# Patient Record
Sex: Male | Born: 1950 | Race: White | Hispanic: No | Marital: Married | Smoking: Current every day smoker
Health system: Southern US, Community
[De-identification: ages and names within clinical notes are randomized; demographics above are authoritative.]

## PROBLEM LIST (undated history)

## (undated) DIAGNOSIS — F329 Major depressive disorder, single episode, unspecified: Secondary | ICD-10-CM

## (undated) DIAGNOSIS — F419 Anxiety disorder, unspecified: Secondary | ICD-10-CM

## (undated) DIAGNOSIS — M1990 Unspecified osteoarthritis, unspecified site: Secondary | ICD-10-CM

## (undated) DIAGNOSIS — F32A Depression, unspecified: Secondary | ICD-10-CM

## (undated) DIAGNOSIS — I10 Essential (primary) hypertension: Secondary | ICD-10-CM

## (undated) DIAGNOSIS — T7840XA Allergy, unspecified, initial encounter: Secondary | ICD-10-CM

## (undated) HISTORY — DX: Unspecified osteoarthritis, unspecified site: M19.90

## (undated) HISTORY — DX: Anxiety disorder, unspecified: F41.9

## (undated) HISTORY — PX: JOINT REPLACEMENT: SHX530

## (undated) HISTORY — DX: Major depressive disorder, single episode, unspecified: F32.9

## (undated) HISTORY — DX: Depression, unspecified: F32.A

## (undated) HISTORY — DX: Essential (primary) hypertension: I10

## (undated) HISTORY — DX: Allergy, unspecified, initial encounter: T78.40XA

---

## 1997-07-07 ENCOUNTER — Emergency Department (HOSPITAL_COMMUNITY): Admission: EM | Admit: 1997-07-07 | Discharge: 1997-07-07 | Payer: Self-pay | Admitting: Emergency Medicine

## 2009-12-08 ENCOUNTER — Emergency Department (HOSPITAL_COMMUNITY)
Admission: EM | Admit: 2009-12-08 | Discharge: 2009-12-08 | Payer: Self-pay | Source: Home / Self Care | Admitting: Emergency Medicine

## 2010-12-23 ENCOUNTER — Ambulatory Visit (INDEPENDENT_AMBULATORY_CARE_PROVIDER_SITE_OTHER): Payer: PRIVATE HEALTH INSURANCE

## 2010-12-23 DIAGNOSIS — F339 Major depressive disorder, recurrent, unspecified: Secondary | ICD-10-CM

## 2010-12-23 DIAGNOSIS — Z0000 Encounter for general adult medical examination without abnormal findings: Secondary | ICD-10-CM

## 2010-12-23 DIAGNOSIS — I10 Essential (primary) hypertension: Secondary | ICD-10-CM

## 2010-12-23 DIAGNOSIS — F17200 Nicotine dependence, unspecified, uncomplicated: Secondary | ICD-10-CM

## 2010-12-24 ENCOUNTER — Other Ambulatory Visit: Payer: Self-pay | Admitting: Family Medicine

## 2010-12-24 DIAGNOSIS — M5412 Radiculopathy, cervical region: Secondary | ICD-10-CM

## 2011-01-05 ENCOUNTER — Ambulatory Visit
Admission: RE | Admit: 2011-01-05 | Discharge: 2011-01-05 | Disposition: A | Discharge status: Home / Self Care | Payer: Self-pay | Source: Ambulatory Visit | Attending: Family Medicine | Admitting: Family Medicine

## 2011-01-05 DIAGNOSIS — M5412 Radiculopathy, cervical region: Secondary | ICD-10-CM

## 2011-05-03 ENCOUNTER — Other Ambulatory Visit: Payer: Self-pay | Admitting: Family Medicine

## 2011-05-30 ENCOUNTER — Ambulatory Visit (INDEPENDENT_AMBULATORY_CARE_PROVIDER_SITE_OTHER): Payer: BC Managed Care – PPO | Admitting: Physician Assistant

## 2011-05-30 ENCOUNTER — Encounter: Payer: Self-pay | Admitting: Physician Assistant

## 2011-05-30 VITALS — BP 112/72 | HR 83 | Temp 97.9°F | Resp 20 | Ht 69.5 in | Wt 210.0 lb

## 2011-05-30 DIAGNOSIS — R319 Hematuria, unspecified: Secondary | ICD-10-CM

## 2011-05-30 DIAGNOSIS — Z87891 Personal history of nicotine dependence: Secondary | ICD-10-CM

## 2011-05-30 DIAGNOSIS — I10 Essential (primary) hypertension: Secondary | ICD-10-CM

## 2011-05-30 DIAGNOSIS — R1032 Left lower quadrant pain: Secondary | ICD-10-CM

## 2011-05-30 LAB — POCT URINALYSIS DIPSTICK
Bilirubin, UA: NEGATIVE
Glucose, UA: NEGATIVE
Ketones, UA: NEGATIVE
Nitrite, UA: NEGATIVE
Protein, UA: NEGATIVE
Spec Grav, UA: 1.025
Urobilinogen, UA: 0.2
pH, UA: 5.5

## 2011-05-30 LAB — POCT CBC
Granulocyte percent: 66.5 %G (ref 37–80)
HCT, POC: 46.3 % (ref 43.5–53.7)
Hemoglobin: 15.8 g/dL (ref 14.1–18.1)
Lymph, poc: 1.8 (ref 0.6–3.4)
MCH, POC: 30.4 pg (ref 27–31.2)
MCHC: 34.1 g/dL (ref 31.8–35.4)
MCV: 89.3 fL (ref 80–97)
MID (cbc): 0.8 (ref 0–0.9)
MPV: 9.8 fL (ref 0–99.8)
POC Granulocyte: 5.1 (ref 2–6.9)
POC LYMPH PERCENT: 23.2 %L (ref 10–50)
POC MID %: 10.3 %M (ref 0–12)
Platelet Count, POC: 201 10*3/uL (ref 142–424)
RBC: 5.19 M/uL (ref 4.69–6.13)
RDW, POC: 13.2 %
WBC: 7.7 10*3/uL (ref 4.6–10.2)

## 2011-05-30 LAB — POCT UA - MICROSCOPIC ONLY
Casts, Ur, LPF, POC: NEGATIVE
Crystals, Ur, HPF, POC: NEGATIVE
Epithelial cells, urine per micros: NEGATIVE
Mucus, UA: NEGATIVE
Yeast, UA: NEGATIVE

## 2011-05-30 LAB — IFOBT (OCCULT BLOOD): IFOBT: NEGATIVE

## 2011-05-30 MED ORDER — LISINOPRIL 10 MG PO TABS: 10.0000 mg | ORAL_TABLET | Freq: Every day | ORAL | Status: DC

## 2011-05-30 MED ORDER — CIPROFLOXACIN HCL 500 MG PO TABS: 500.0000 mg | ORAL_TABLET | Freq: Two times a day (BID) | ORAL | Status: AC

## 2011-05-30 NOTE — Progress Notes (Signed)
Precepted with Ms. Marte, PA-C and agree.  

## 2011-05-30 NOTE — Progress Notes (Signed)
Subjective:    Patient ID: Keith Cobb, male    DOB: June 21, 1950, 61 y.o.   MRN: [id number]  HPI Patient presents with LLQ pain since 05/28/11. Describes as a dull, achy pain. History of diverticulitis in 2009 that he believes was treated with antibiotics but he does not know for certain. Last normal BM was this morning. No dysuria, urinary frequency, nausea, vomiting, diarrhea, constipation, melena, or hematochezia.  Patient also admits that he has been taking Cymbalta 60 mg daily which he believes is working well. He does not wish to change the dose today although he does admit to increased stress at work. Believes that his abdominal pain could also be related to stress due since the episodes tend to coincide with exacerbations of anxiety and stress.     Review of Systems  Constitutional: Negative for fever and chills.  Gastrointestinal: Positive for abdominal pain. Negative for nausea, vomiting, diarrhea and blood in stool.  Genitourinary: Negative for dysuria, frequency, flank pain, scrotal swelling and difficulty urinating.  Skin: Negative for rash.  Neurological: Negative for dizziness.  Psychiatric/Behavioral: The patient is nervous/anxious.        Objective:   Physical Exam  Constitutional: He is oriented to person, place, and time. He appears well-developed and well-nourished.  HENT:  Head: Normocephalic and atraumatic.  Right Ear: External ear normal.  Left Ear: External ear normal.  Eyes: Conjunctivae are normal.  Neck: Neck supple.  Cardiovascular: Normal rate, regular rhythm and normal heart sounds.   Pulmonary/Chest: Effort normal and breath sounds normal.  Abdominal: Normal appearance and bowel sounds are normal. There is no splenomegaly or hepatomegaly. There is tenderness in the suprapubic area and left lower quadrant. There is guarding. There is no rebound, no CVA tenderness and no tenderness at McBurney's point. Hernia confirmed negative in the right inguinal area and  confirmed negative in the left inguinal area.  Genitourinary: Testes normal and penis normal. Rectal exam shows anal tone normal. Prostate is not enlarged and not tender. Right testis shows no tenderness. Left testis shows no tenderness.  Lymphadenopathy:       Right: No inguinal adenopathy present.       Left: No inguinal adenopathy present.  Neurological: He is alert and oriented to person, place, and time.  Psychiatric: He has a normal mood and affect. His behavior is normal. Judgment and thought content normal.    Results for orders placed in visit on 05/30/11  POCT CBC      Component Value Range   WBC 7.7  4.6 - 10.2 (K/uL)   Lymph, poc 1.8  0.6 - 3.4    POC LYMPH PERCENT 23.2  10 - 50 (%L)   MID (cbc) 0.8  0 - 0.9    POC MID % 10.3  0 - 12 (%M)   POC Granulocyte 5.1  2 - 6.9    Granulocyte percent 66.5  37 - 80 (%G)   RBC 5.19  4.69 - 6.13 (M/uL)   Hemoglobin 15.8  14.1 - 18.1 (g/dL)   HCT, POC 53.6  64.4 - 53.7 (%)   MCV 89.3  80 - 97 (fL)   MCH, POC 30.4  27 - 31.2 (pg)   MCHC 34.1  31.8 - 35.4 (g/dL)   RDW, POC 03.4     Platelet Count, POC 201  142 - 424 (K/uL)   MPV 9.8  0 - 99.8 (fL)  POCT URINALYSIS DIPSTICK      Component Value Range   Color, UA  Psychologist, educational, UA clear     Glucose, UA neg     Bilirubin, UA neg     Ketones, UA neg     Spec Grav, UA 1.025     Blood, UA moderate     pH, UA 5.5     Protein, UA neg     Urobilinogen, UA 0.2     Nitrite, UA neg     Leukocytes, UA small (1+)    POCT UA - MICROSCOPIC ONLY      Component Value Range   WBC, Ur, HPF, POC 4-5     RBC, urine, microscopic 5-8     Bacteria, U Microscopic small     Mucus, UA neg     Epithelial cells, urine per micros neg     Crystals, Ur, HPF, POC neg     Casts, Ur, LPF, POC neg     Yeast, UA neg    IFOBT (OCCULT BLOOD)      Component Value Range   IFOBT Negative           Assessment & Plan:            1. Abdominal pain, left lower quadrant  Will treat as urinary  tract infection and also cover prostatitis while awaiting urine culture.  Patient instructed to return in 48-72 hours if he does not notice significant improvement in symptoms.  POCT CBC, POCT urinalysis dipstick, POCT UA - Microscopic Only, IFOBT POC (occult bld, rslt in office)  2. Hematuria  Recommend recheck at next visit to ensure clearance.  Urine culture  3. Personal history of tobacco use  Smoking cessation counseling provided Patient stable on current dose of Cymbalta   4. Hypertension  Lisinopril 10 mg refilled x 6 months with follow up at that time and labwork at that time.

## 2011-05-30 NOTE — Patient Instructions (Addendum)
Take Cipro 500 mg twice daily x 7 days If you do not notice significant improvement in 48-72 hours, return to clinic for further evaluation

## 2011-05-31 LAB — URINE CULTURE
Colony Count: NO GROWTH
Organism ID, Bacteria: NO GROWTH

## 2011-08-02 ENCOUNTER — Other Ambulatory Visit: Payer: Self-pay | Admitting: Family Medicine

## 2011-08-03 ENCOUNTER — Telehealth: Payer: Self-pay

## 2011-08-03 NOTE — Telephone Encounter (Signed)
Pt needs to get refill on his cymbalta

## 2011-08-04 NOTE — Telephone Encounter (Signed)
Cymbalta was refilled on 7/23. If not at pharmacy can call it in.

## 2011-08-05 NOTE — Telephone Encounter (Signed)
PT GOT RX

## 2011-09-19 ENCOUNTER — Ambulatory Visit (INDEPENDENT_AMBULATORY_CARE_PROVIDER_SITE_OTHER): Payer: BC Managed Care – PPO | Admitting: Family Medicine

## 2011-09-19 ENCOUNTER — Encounter: Payer: Self-pay | Admitting: Family Medicine

## 2011-09-19 VITALS — BP 112/80 | HR 70 | Temp 98.2°F | Resp 18 | Ht 68.5 in | Wt 205.2 lb

## 2011-09-19 DIAGNOSIS — L720 Epidermal cyst: Secondary | ICD-10-CM

## 2011-09-19 DIAGNOSIS — I10 Essential (primary) hypertension: Secondary | ICD-10-CM

## 2011-09-19 DIAGNOSIS — L723 Sebaceous cyst: Secondary | ICD-10-CM

## 2011-09-19 DIAGNOSIS — F419 Anxiety disorder, unspecified: Secondary | ICD-10-CM

## 2011-09-19 DIAGNOSIS — L821 Other seborrheic keratosis: Secondary | ICD-10-CM

## 2011-09-19 DIAGNOSIS — F341 Dysthymic disorder: Secondary | ICD-10-CM

## 2011-09-19 DIAGNOSIS — K644 Residual hemorrhoidal skin tags: Secondary | ICD-10-CM

## 2011-09-19 MED ORDER — LISINOPRIL 10 MG PO TABS: 10.0000 mg | ORAL_TABLET | Freq: Every day | ORAL | Status: DC

## 2011-09-19 MED ORDER — DULOXETINE HCL 60 MG PO CPEP: 60.0000 mg | ORAL_CAPSULE | Freq: Two times a day (BID) | ORAL | Status: DC

## 2011-09-19 NOTE — Progress Notes (Signed)
Subjective: 61 year old man who has been doing well lately. He continues to take the Cymbalta. He would like something to give her more energy. He's tried testosterone unsuccessfully. He continues to work his job, and that seems to be stable. He is doing security work. He takes Cymbalta 60 mg twice daily. He has a skin lesions that he wants me to look at. He has a skin tag on each of his eyelids and they skin lesion in his left sideburn.  Objective: Alert, oriented, moderately overweight white male. He is hard of hearing. He has a seborrheic keratosis in the anterior aspect of his left sideburn which is approximately 5 mm in diameter. Each eyelid has a single skin today on it. Neck supple without thyromegaly. Chest clear. Heart regular without murmurs. Abdomen soft nontender. No ankle edema. He says that he is then removed on 2 occasions in the past has recurred on the right side of his neck.  Procedure: Cryosurgery The seborrheic keratosis on his temple was treated with the cryosurgery liquid nitrogen. Free thaw refreeze x2. He tolerated well.  Excision of skin tags Each of the skin tags was removed with alcohol prep, then clipping off with sharp scissors. 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 He is to return in one week for me to excise the cyst from the right side of his neck. I will do that tomorrow for building in the procedure room approximately 3:00  Same medications except will change him from the 30 mg Cymbalta to 60 mg she will take one pill twice a day.

## 2011-09-19 NOTE — Patient Instructions (Signed)
Return next Tuesday 17th at 3 PM to [address] office.

## 2011-09-27 ENCOUNTER — Ambulatory Visit (INDEPENDENT_AMBULATORY_CARE_PROVIDER_SITE_OTHER): Payer: BC Managed Care – PPO | Admitting: Family Medicine

## 2011-09-27 VITALS — BP 126/87 | HR 86 | Temp 98.1°F | Resp 16 | Ht 69.5 in | Wt 206.0 lb

## 2011-09-27 DIAGNOSIS — L723 Sebaceous cyst: Secondary | ICD-10-CM

## 2011-09-27 DIAGNOSIS — L720 Epidermal cyst: Secondary | ICD-10-CM

## 2011-09-27 NOTE — Progress Notes (Signed)
  Procedure note: Patient came in for me to remove the cyst from the right neck.  It has a lot of old scar with it.  1% lidocaine.  Tolerated well.  Excision of cyst plus old scar which was about 2.3 cm..  Repair with interrupted sutures.  Return 1 week.  Imp: epidermal cyst

## 2011-10-01 ENCOUNTER — Encounter: Payer: Self-pay | Admitting: Family Medicine

## 2012-03-19 ENCOUNTER — Ambulatory Visit (INDEPENDENT_AMBULATORY_CARE_PROVIDER_SITE_OTHER): Payer: BC Managed Care – PPO | Admitting: Family Medicine

## 2012-03-19 VITALS — BP 118/80 | HR 69 | Temp 98.2°F | Ht 69.0 in | Wt 206.2 lb

## 2012-03-19 DIAGNOSIS — F329 Major depressive disorder, single episode, unspecified: Secondary | ICD-10-CM

## 2012-03-19 DIAGNOSIS — F32A Depression, unspecified: Secondary | ICD-10-CM | POA: Insufficient documentation

## 2012-03-19 DIAGNOSIS — I10 Essential (primary) hypertension: Secondary | ICD-10-CM | POA: Insufficient documentation

## 2012-03-19 MED ORDER — LISINOPRIL 10 MG PO TABS: 10.0000 mg | ORAL_TABLET | Freq: Every day | ORAL | Status: DC

## 2012-03-19 MED ORDER — DULOXETINE HCL 60 MG PO CPEP: 60.0000 mg | ORAL_CAPSULE | Freq: Two times a day (BID) | ORAL | Status: DC

## 2012-03-19 NOTE — Progress Notes (Signed)
[facility] [address], [place] [address] [phone]  Date:  03/19/2012   Name:  CONSUELO Cobb   DOB:  04/11/1950   MRN:  [id number]  PCP:  No primary Earlene Bjelland on file.    Chief Complaint: Medication Refill   History of Present Illness:  Keith Cobb is a 62 y.o. very pleasant male patient who presents with the following:  He is here today for a medication refill- he would like to see his PCP Dr. Alwyn Ren soon, but did not have time today.  He knows he is due for labs but defers these today as he is short on time.  He is doing well with his BP control, and cymbalta is working well for his mood.  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  However, he continues to stay active as he is not ready to contemplate surgery or other treatments at this time  There is no problem list on file for this patient.   Past Medical History  Diagnosis Date  . Hypertension   . Arthritis     History reviewed. No pertinent past surgical history.  History  Substance Use Topics  . Smoking status: Current Every Day Smoker -- 0.30 packs/day for 39 years    Types: Cigarettes  . Smokeless tobacco: Not on file  . Alcohol Use: Not on file    History reviewed. No pertinent family history.  Allergies  Allergen Reactions  . Bee Venom     Medication list has been reviewed and updated.  Current Outpatient Prescriptions on File Prior to Visit  Medication Sig Dispense Refill  . DULoxetine (CYMBALTA) 60 MG capsule Take 1 capsule (60 mg total) by mouth 2 (two) times daily.  60 capsule  5  . lisinopril (PRINIVIL,ZESTRIL) 10 MG tablet Take 1 tablet (10 mg total) by mouth daily.  90 tablet  3  . Multiple Vitamin (MULTIVITAMIN) tablet Take 1 tablet by mouth daily.       No current facility-administered medications on file prior to visit.    Review of Systems:  As per HPI- otherwise negative.   Physical Examination: Filed Vitals:   03/19/12 1538   BP: 118/80  Pulse: 69  Temp: 98.2 F (36.8 C)   Filed Vitals:   03/19/12 1538  Height: 5\' 9"  (1.753 m)  Weight: 206 lb 3.2 oz (93.532 kg)   Body mass index is 30.44 kg/(m^2). Ideal Body Weight: Weight in (lb) to have BMI = 25: 168.9  GEN: WDWN, NAD, Non-toxic, A & O x 3 HEENT: Atraumatic, Normocephalic. Neck supple. No masses, No LAD. Ears and Nose: No external deformity. CV: RRR, No M/G/R. No JVD. No thrill. No extra heart sounds. PULM: CTA B, no wheezes, crackles, rhonchi. No retractions. No resp. distress. No accessory muscle use. EXTR: No c/c/e NEURO Normal gait.  PSYCH: Normally interactive. Conversant. Not depressed or anxious appearing.  Calm demeanor.    Assessment and Plan: HTN (hypertension) - Plan: lisinopril (PRINIVIL,ZESTRIL) 10 MG tablet  Anxiety and depression - Plan: DULoxetine (CYMBALTA) 60 MG capsule  Refilled medications today.  He will see Dr. Alwyn Ren soon- offered labs today but he declined.  He is doing well with his current medications, aware that he needs labs asap   Abbe Amsterdam, MD

## 2012-03-19 NOTE — Patient Instructions (Addendum)
Continue using your medications.  Come and see [place] for fasting labs when you can- let us know if any problems in the meantime

## 2012-05-08 ENCOUNTER — Ambulatory Visit (INDEPENDENT_AMBULATORY_CARE_PROVIDER_SITE_OTHER): Payer: BC Managed Care – PPO | Admitting: Family Medicine

## 2012-05-08 VITALS — BP 118/76 | HR 76 | Temp 98.5°F | Resp 16 | Ht 68.75 in | Wt 210.4 lb

## 2012-05-08 DIAGNOSIS — M1612 Unilateral primary osteoarthritis, left hip: Secondary | ICD-10-CM

## 2012-05-08 DIAGNOSIS — M169 Osteoarthritis of hip, unspecified: Secondary | ICD-10-CM

## 2012-05-08 DIAGNOSIS — M4802 Spinal stenosis, cervical region: Secondary | ICD-10-CM

## 2012-05-08 DIAGNOSIS — F329 Major depressive disorder, single episode, unspecified: Secondary | ICD-10-CM

## 2012-05-08 DIAGNOSIS — Z0000 Encounter for general adult medical examination without abnormal findings: Secondary | ICD-10-CM

## 2012-05-08 DIAGNOSIS — F32A Depression, unspecified: Secondary | ICD-10-CM

## 2012-05-08 DIAGNOSIS — F419 Anxiety disorder, unspecified: Secondary | ICD-10-CM

## 2012-05-08 DIAGNOSIS — I10 Essential (primary) hypertension: Secondary | ICD-10-CM

## 2012-05-08 DIAGNOSIS — M5412 Radiculopathy, cervical region: Secondary | ICD-10-CM

## 2012-05-08 LAB — POCT CBC
Granulocyte percent: 73.2 %G (ref 37–80)
HCT, POC: 43.6 % (ref 43.5–53.7)
Hemoglobin: 13.7 g/dL — AB (ref 14.1–18.1)
POC Granulocyte: 4.7 (ref 2–6.9)
POC LYMPH PERCENT: 18.4 %L (ref 10–50)
RBC: 4.9 M/uL (ref 4.69–6.13)
RDW, POC: 14.7 %

## 2012-05-08 LAB — COMPREHENSIVE METABOLIC PANEL
AST: 12 U/L (ref 0–37)
Albumin: 4.1 g/dL (ref 3.5–5.2)
Alkaline Phosphatase: 56 U/L (ref 39–117)
Chloride: 104 mEq/L (ref 96–112)
Potassium: 5.1 mEq/L (ref 3.5–5.3)
Sodium: 135 mEq/L (ref 135–145)
Total Protein: 6.6 g/dL (ref 6.0–8.3)

## 2012-05-08 LAB — POCT URINALYSIS DIPSTICK
Glucose, UA: NEGATIVE
Nitrite, UA: NEGATIVE
Urobilinogen, UA: 0.2

## 2012-05-08 LAB — POCT UA - MICROSCOPIC ONLY
Casts, Ur, LPF, POC: NEGATIVE
Crystals, Ur, HPF, POC: NEGATIVE

## 2012-05-08 LAB — POCT GLYCOSYLATED HEMOGLOBIN (HGB A1C): Hemoglobin A1C: 5.5

## 2012-05-08 LAB — LIPID PANEL: LDL Cholesterol: 45 mg/dL (ref 0–99)

## 2012-05-08 LAB — IFOBT (OCCULT BLOOD): IFOBT: NEGATIVE

## 2012-05-08 NOTE — Patient Instructions (Signed)
Same meds  Seek to maintain physical, emotional, relational and spiritual health as we discussed.  Decrease beer intake on weekends  Return if worse, otherwise return about Oct-Nov 2014

## 2012-05-08 NOTE — Progress Notes (Signed)
Complete physical:  History: Patient is here for regular physical examination. He has no major acute complaints. He does have his chronic pain from his neck and numbness down his left arm. 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 He is not interested going to a neurosurgeon yet. He takes his medications faithfully. 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  Past history: Medications: Cymbalta 120 mg daily Lisinopril 10 mg daily Medication allergies: None, however he is yellow jacket allergic which cause swelling. Past medical history: He has a history of a lot of anxiety and depression. He has arthritic problems in his neck. He has had problems with his ankles also. He has a history of excessive alcohol intake. Now Past surgical history: He has had ankle surgery and a T&A.  Family history: He never knew his mother. She is deceased. His father is living, 21, has had a recent CVA: His siblings are living and well  Social history: Patient is married. Has not had intercourse in the last year. He smokes about a third pack of cigarettes a day. He drinks about 12 beers on the weekend just to relax him. 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 Exercises by walking on his job every day.  Systems: Constitutional: Unremarkable HEENT: Unremarkable Eyes: Unremarkable Respiratory unremarkable Cardiovascular: Unremarkable Gastrointestinal: Unremarkable Genitourinary: Unremarkable Musculoskeletal: Has joint pains in his knees and ankles neck shoulders arms Dermatologic: Unremarkable Neurologic: Unremarkable Hematologic: Unremarkable Psychiatric: He is stable on his medications Endocrinologic: Unremarkable  Physical examination: Modestly overweight male in no acute distress. TMs are normal. He is a little hard of hearing. Eyes PERRLA. Fundi benign. Throat clear. Neck supple without nodes or thyromegaly.  No carotid bruits. Chest clear to auscultation. Heart regular without murmurs gallops or arrhythmias. Abdomen soft without organomegaly mass or tenderness. Normal male external genitalia testes descended. No hernias. Digital rectal exam reveals the prostate gland to be normal without any nodules. Extremities without edema. Pedal pulses are good on the right, diminished on the left. He has lateral deviation of both his ankles. He has scar from surgery on his right ankle. Skin otherwise unremarkable except for a nevus on his left cheek which he says is been there all his life.  Assessment: Complete physical exam Anxiety and depression Tobacco abuse Excessive alcohol use Hypertension controlled  Plan: Continue same medications. Return in about 6 months.      Results for orders placed in visit on 05/08/12  POCT CBC      Result Value Range   WBC 6.4  4.6 - 10.2 K/uL   Lymph, poc 1.2  0.6 - 3.4   POC LYMPH PERCENT 18.4  10 - 50 %L   MID (cbc) 0.5  0 - 0.9   POC MID % 8.4  0 - 12 %M   POC Granulocyte 4.7  2 - 6.9   Granulocyte percent 73.2  37 - 80 %G   RBC 4.90  4.69 - 6.13 M/uL   Hemoglobin 13.7 (*) 14.1 - 18.1 g/dL   HCT, POC 16.1  09.6 - 53.7 %   MCV 88.9  80 - 97 fL   MCH, POC 28.0  27 - 31.2 pg   MCHC 31.4 (*) 31.8 - 35.4 g/dL   RDW, POC 04.5     Platelet Count, POC 212  142 - 424 K/uL   MPV 8.8  0 - 99.8  fL  IFOBT (OCCULT BLOOD)      Result Value Range   IFOBT Negative    POCT GLYCOSYLATED HEMOGLOBIN (HGB A1C)      Result Value Range   Hemoglobin A1C 5.5    POCT URINALYSIS DIPSTICK      Result Value Range   Color, UA yellow     Clarity, UA clear     Glucose, UA neg     Bilirubin, UA neg     Ketones, UA neg     Spec Grav, UA 1.020     Blood, UA trace     pH, UA 5.5     Protein, UA neg     Urobilinogen, UA 0.2     Nitrite, UA neg     Leukocytes, UA small (1+)    POCT UA - MICROSCOPIC ONLY      Result Value Range   WBC, Ur, HPF, POC 2-4     RBC, urine,  microscopic 1-3     Bacteria, U Microscopic trace     Mucus, UA neg     Epithelial cells, urine per micros 1-2     Crystals, Ur, HPF, POC neg     Casts, Ur, LPF, POC neg     Yeast, UA neg

## 2012-05-09 ENCOUNTER — Encounter: Payer: Self-pay | Admitting: *Deleted

## 2012-10-30 ENCOUNTER — Ambulatory Visit (INDEPENDENT_AMBULATORY_CARE_PROVIDER_SITE_OTHER): Payer: BC Managed Care – PPO | Admitting: Internal Medicine

## 2012-10-30 ENCOUNTER — Ambulatory Visit: Payer: BC Managed Care – PPO

## 2012-10-30 VITALS — BP 110/70 | HR 84 | Temp 97.8°F | Resp 16 | Ht 68.5 in | Wt 208.0 lb

## 2012-10-30 DIAGNOSIS — M25469 Effusion, unspecified knee: Secondary | ICD-10-CM

## 2012-10-30 DIAGNOSIS — M25462 Effusion, left knee: Secondary | ICD-10-CM

## 2012-10-30 DIAGNOSIS — Z23 Encounter for immunization: Secondary | ICD-10-CM

## 2012-10-30 DIAGNOSIS — M25569 Pain in unspecified knee: Secondary | ICD-10-CM

## 2012-10-30 DIAGNOSIS — M25562 Pain in left knee: Secondary | ICD-10-CM

## 2012-10-30 MED ORDER — MELOXICAM 15 MG PO TABS: 15.0000 mg | ORAL_TABLET | Freq: Every day | ORAL | Status: DC

## 2012-10-30 NOTE — Patient Instructions (Addendum)
Influenza Vaccine (Flu Vaccine, Inactivated) 2013 2014 What You Need to Know WHY GET VACCINATED?  Influenza ("flu") is a contagious disease that spreads around the [place] every winter, usually between October and May.  Flu is caused by the influenza virus, and can be spread by coughing, sneezing, and close contact.  Anyone can get flu, but the risk of getting flu is highest among children. Symptoms come on suddenly and may last several days. They can include:  Fever or chills.  Sore throat.  Muscle aches.  Fatigue.  Cough.  Headache.  Runny or stuffy nose. Flu can make some people much sicker than others. These people include young children, people 65 and older, pregnant women, and people with certain health conditions such as heart, lung or kidney disease, or a weakened immune system. Flu vaccine is especially important for these people, and anyone in close contact with them. Flu can also lead to pneumonia, and make existing medical conditions worse. It can cause diarrhea and seizures in children. Each year thousands of people in the [place] die from flu, and many more are hospitalized. Flu vaccine is the best protection we have from flu and its complications. Flu vaccine also helps prevent spreading flu from person to person. INACTIVATED FLU VACCINE There are 2 types of influenza vaccine:  You are getting an inactivated flu vaccine, which does not contain any live influenza virus. It is given by injection with a needle, and often called the "flu shot."  A different live, attenuated (weakened) influenza vaccine is sprayed into the nostrils. This vaccine is described in a separate Vaccine Information Statement. Flu vaccine is recommended every year. Children 6 months through 8 years of age should get 2 doses the first year they get vaccinated. Flu viruses are always changing. Each year's flu vaccine is made to protect from viruses that are most likely to cause disease  that year. While flu vaccine cannot prevent all cases of flu, it is our best defense against the disease. Inactivated flu vaccine protects against 3 or 4 different influenza viruses. It takes about 2 weeks for protection to develop after the vaccination, and protection lasts several months to a year. Some illnesses that are not caused by influenza virus are often mistaken for flu. Flu vaccine will not prevent these illnesses. It can only prevent influenza. A "high-dose" flu vaccine is available for people 65 years of age and older. The person giving you the vaccine can tell you more about it. Some inactivated flu vaccine contains a very small amount of a mercury-based preservative called thimerosal. Studies have shown that thimerosal in vaccines is not harmful, but flu vaccines that do not contain a preservative are available. SOME PEOPLE SHOULD NOT GET THIS VACCINE Tell the person who gives you the vaccine:  If you have any severe (life-threatening) allergies. If you ever had a life-threatening allergic reaction after a dose of flu vaccine, or have a severe allergy to any part of this vaccine, you may be advised not to get a dose. Most, but not all, types of flu vaccine contain a small amount of egg.  If you ever had Guillain Barr Syndrome (a severe paralyzing illness, also called GBS). Some people with a history of GBS should not get this vaccine. This should be discussed with your doctor.  If you are not feeling well. They might suggest waiting until you feel better. But you should come back. RISKS OF A VACCINE REACTION With a vaccine, like any medicine, there   is a chance of side effects. These are usually mild and go away on their own. Serious side effects are also possible, but are very rare. Inactivated flu vaccine does not contain live flu virus, sogetting flu from this vaccine is not possible. Brief fainting spells and related symptoms (such as jerking movements) can happen after any medical  procedure, including vaccination. Sitting or lying down for about 15 minutes after a vaccination can help prevent fainting and injuries caused by falls. Tell your doctor if you feel dizzy or lightheaded, or have vision changes or ringing in the ears. Mild problems following inactivated flu vaccine:  Soreness, redness, or swelling where the shot was given.  Hoarseness; sore, red or itchy eyes; or cough.  Fever.  Aches.  Headache.  Itching.  Fatigue. If these problems occur, they usually begin soon after the shot and last 1 or 2 days. Moderate problems following inactivated flu vaccine:  Young children who get inactivated flu vaccine and pneumococcal vaccine (PCV13) at the same time may be at increased risk for seizures caused by fever. Ask your doctor for more information. Tell your doctor if a child who is getting flu vaccine has ever had a seizure. Severe problems following inactivated flu vaccine:  A severe allergic reaction could occur after any vaccine (estimated less than 1 in a million doses).  There is a small possibility that inactivated flu vaccine could be associated with Guillan Barr Syndrome (GBS), no more than 1 or 2 cases per million people vaccinated. This is much lower than the risk of severe complications from flu, which can be prevented by flu vaccine. The safety of vaccines is always being monitored. For more information, visit: http://floyd.org/ WHAT IF THERE IS A SERIOUS REACTION? What should I look for?  Look for anything that concerns you, such as signs of a severe allergic reaction, very high fever, or behavior changes. Signs of a severe allergic reaction can include hives, swelling of the face and throat, difficulty breathing, a fast heartbeat, dizziness, and weakness. These would start a few minutes to a few hours after the vaccination. What should I do?  If you think it is a severe allergic reaction or other emergency that cannot wait, call 9 1 1   or get the person to the nearest hospital. Otherwise, call your doctor.  Afterward, the reaction should be reported to the Vaccine Adverse Event Reporting System (VAERS). Your doctor might file this report, or you can do it yourself through the VAERS website at www.vaers.LAgents.no, or by calling 1-939-199-3234. VAERS is only for reporting reactions. They do not give medical advice. THE NATIONAL VACCINE INJURY COMPENSATION PROGRAM The National Vaccine Injury Compensation Program (VICP) is a federal program that was created to compensate people who may have been injured by certain vaccines. Persons who believe they may have been injured by a vaccine can learn about the program and about filing a claim by calling 1-3020539211 or visiting the VICP website at SpiritualWord.at HOW CAN I LEARN MORE?  Ask your doctor.  Call your local or state health department.  Contact the Centers for Disease Control and Prevention (CDC):  Call 279-371-6026 (1-800-CDC-INFO) or  Visit CDC's website at BiotechRoom.com.cy CDC Inactivated Influenza Vaccine Interim VIS (08/05/11) Document Released: 10/21/2005 Document Revised: 09/21/2011 Document Reviewed: 08/05/2011 [facility] Patient Information 2014 [place], [place]. Wear and Tear Disorders of the Knee (Arthritis, Osteoarthritis) Everyone will experience wear and tear injuries (arthritis, osteoarthritis) of the knee. These are the changes we all get as we age. They  come from the joint stress of daily living. The amount of cartilage damage in your knee and your symptoms determine if you need surgery. Mild problems require approximately two months recovery time. More severe problems take several months to recover. With mild problems, your surgeon may find worn and rough cartilage surfaces. With severe changes, your surgeon may find cartilage that has completely worn away and exposed the bone. Loose bodies of bone and cartilage, bone spurs (excess bone growth),  and injuries to the menisci (cushions between the large bones of your leg) are also common. All of these problems can cause pain. For a mild wear and tear problem, rough cartilage may simply need to be shaved and smoothed. For more severe problems with areas of exposed bone, your surgeon may use an instrument for roughing up the bone surfaces to stimulate new cartilage growth. Loose bodies are usually removed. Torn menisci may be trimmed or repaired. ABOUT THE ARTHROSCOPIC PROCEDURE Arthroscopy is a surgical technique. It allows your orthopedic surgeon to diagnose and treat your knee injury with accuracy. The surgeon looks into your knee through a small scope. The scope is like a small (pencil-sized) telescope. Arthroscopy is less invasive than open knee surgery. You can expect a more rapid recovery. After the procedure, you will be moved to a recovery area until most of the effects of the medication have worn off. Your caregiver will discuss the test results with you. RECOVERY The severity of the arthritis and the type of procedure performed will determine recovery time. Other important factors include age, physical condition, medical conditions, and the type of rehabilitation program. Strengthening your muscles after arthroscopy helps guarantee a better recovery. Follow your caregiver's instructions. Use crutches, rest, elevate, ice, and do knee exercises as instructed. Your caregivers will help you and instruct you with exercises and other physical therapy required to regain your mobility, muscle strength, and functioning following surgery. Only take over-the-counter or prescription medicines for pain, discomfort, or fever as directed by your caregiver.  SEEK MEDICAL CARE IF:   There is increased bleeding (more than a small spot) from the wound.  You notice redness, swelling, or increasing pain in the wound.  Pus is coming from wound.  You develop an unexplained oral temperature above 102 F (38.9  C) , or as your caregiver suggests.  You notice a foul smell coming from the wound or dressing.  You have severe pain with motion of the knee. SEEK IMMEDIATE MEDICAL CARE IF:   You develop a rash.  You have difficulty breathing.  You have any allergic problems. MAKE SURE YOU:   Understand these instructions.  Will watch your condition.  Will get help right away if you are not doing well or get worse. Document Released: 12/25/1999 Document Revised: 03/21/2011 Document Reviewed: 05/23/2007 [facility] Patient Information 2014 [place], [place].

## 2012-10-30 NOTE — Progress Notes (Signed)
  Subjective:    Patient ID: Keith Cobb, male    DOB: Jun 08, 1950, 62 y.o.   MRN: [id number]  HPI Patient with left knee pain for many months, is having worsening pain and swelling. 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 He is asking to not be placed at the [employer] for his job. He has had limping with being on his feet, going up and down stairs. Takes BC arthritis rarely when pain is severe. Is very concerned that costs be kept down, is having financial difficulty. Wants to avoid having anything done surgery wise until he is 65 and on Medicare. Wears knee brace with some relief. Has been injected at least once over last 4 yrs Patient has history of right rotator cuff surgery and is having decreased ROM along with worsening decreased ROM of left shoulder.  No fever.   Would like flu shot today.  Review of Systems noncontr    Objective:   Physical Exam  Constitutional: He is oriented to person, place, and time. He appears well-developed and well-nourished.  Neck: Normal range of motion. Neck supple.  Musculoskeletal: He exhibits edema and tenderness.  Decrease shoulder extension bilaterally. Left knee with full ROM, no crepitus, mild swelling noted superiorly. Pain with medial torquing, no pain with lateral movement. No joint instability.  Neurological: He is alert and oriented to person, place, and time.  Skin: Skin is warm and dry.  Psychiatric: He has a normal mood and affect. His behavior is normal. Judgment and thought content normal.   Left knee xray- minimal degen changes      Assessment & Plan:  Left knee pain--osteoarth and ? degen meniscus  Knee swelling, left   Need for prophylactic vaccination and inoculation against influenza - Plan: Flu Vaccine QUAD 36+ mos IM  1- molaxicam 15 mg po qd x 1 month. Continue wearing brace. Note given to restrict work duties to avoid functions that irritate knee. If no improvement in 1 month,  referral to ortho.

## 2012-11-15 ENCOUNTER — Other Ambulatory Visit: Payer: Self-pay

## 2012-11-15 DIAGNOSIS — I10 Essential (primary) hypertension: Secondary | ICD-10-CM

## 2012-11-15 DIAGNOSIS — F329 Major depressive disorder, single episode, unspecified: Secondary | ICD-10-CM

## 2012-11-15 MED ORDER — LISINOPRIL 10 MG PO TABS: 10.0000 mg | ORAL_TABLET | Freq: Every day | ORAL | Status: DC

## 2012-11-15 NOTE — Telephone Encounter (Signed)
lisinopril (PRINIVIL,ZESTRIL) 10 MG tablet DULoxetine (CYMBALTA) 60 MG capsule   [facility] [address]  Wife states she [facility] days ago   [phone]

## 2012-11-15 NOTE — Telephone Encounter (Signed)
Sent Lisinopril. Please advise on Cymbalta, pended

## 2012-11-19 MED ORDER — DULOXETINE HCL 60 MG PO CPEP: 60.0000 mg | ORAL_CAPSULE | Freq: Two times a day (BID) | ORAL | Status: DC

## 2013-02-16 ENCOUNTER — Other Ambulatory Visit: Payer: Self-pay | Admitting: Family Medicine

## 2013-02-19 ENCOUNTER — Ambulatory Visit (INDEPENDENT_AMBULATORY_CARE_PROVIDER_SITE_OTHER): Payer: BC Managed Care – PPO | Admitting: Family Medicine

## 2013-02-19 VITALS — BP 118/80 | HR 79 | Temp 97.3°F | Resp 18 | Ht 68.5 in | Wt 210.0 lb

## 2013-02-19 DIAGNOSIS — M1712 Unilateral primary osteoarthritis, left knee: Secondary | ICD-10-CM

## 2013-02-19 DIAGNOSIS — Z0000 Encounter for general adult medical examination without abnormal findings: Secondary | ICD-10-CM

## 2013-02-19 DIAGNOSIS — F341 Dysthymic disorder: Secondary | ICD-10-CM

## 2013-02-19 DIAGNOSIS — I10 Essential (primary) hypertension: Secondary | ICD-10-CM

## 2013-02-19 DIAGNOSIS — F419 Anxiety disorder, unspecified: Secondary | ICD-10-CM

## 2013-02-19 DIAGNOSIS — IMO0002 Reserved for concepts with insufficient information to code with codable children: Secondary | ICD-10-CM

## 2013-02-19 DIAGNOSIS — M1710 Unilateral primary osteoarthritis, unspecified knee: Secondary | ICD-10-CM

## 2013-02-19 DIAGNOSIS — F329 Major depressive disorder, single episode, unspecified: Secondary | ICD-10-CM

## 2013-02-19 LAB — COMPREHENSIVE METABOLIC PANEL
ALBUMIN: 4 g/dL (ref 3.5–5.2)
ALK PHOS: 53 U/L (ref 39–117)
ALT: 16 U/L (ref 0–53)
AST: 18 U/L (ref 0–37)
BILIRUBIN TOTAL: 1.1 mg/dL (ref 0.2–1.2)
BUN: 10 mg/dL (ref 6–23)
CO2: 27 meq/L (ref 19–32)
Calcium: 9.1 mg/dL (ref 8.4–10.5)
Chloride: 99 mEq/L (ref 96–112)
Creat: 0.78 mg/dL (ref 0.50–1.35)
GLUCOSE: 96 mg/dL (ref 70–99)
Potassium: 4.5 mEq/L (ref 3.5–5.3)
SODIUM: 135 meq/L (ref 135–145)
TOTAL PROTEIN: 6.8 g/dL (ref 6.0–8.3)

## 2013-02-19 LAB — POCT CBC
GRANULOCYTE PERCENT: 67.1 % (ref 37–80)
HCT, POC: 45.3 % (ref 43.5–53.7)
HEMOGLOBIN: 14.3 g/dL (ref 14.1–18.1)
Lymph, poc: 1.4 (ref 0.6–3.4)
MCH, POC: 29.4 pg (ref 27–31.2)
MCHC: 31.6 g/dL — AB (ref 31.8–35.4)
MCV: 93 fL (ref 80–97)
MID (CBC): 0.6 (ref 0–0.9)
MPV: 8.9 fL (ref 0–99.8)
PLATELET COUNT, POC: 166 10*3/uL (ref 142–424)
POC GRANULOCYTE: 4.1 (ref 2–6.9)
POC LYMPH PERCENT: 23.5 %L (ref 10–50)
POC MID %: 9.4 % (ref 0–12)
RBC: 4.87 M/uL (ref 4.69–6.13)
RDW, POC: 13.9 %
WBC: 6.1 10*3/uL (ref 4.6–10.2)

## 2013-02-19 LAB — LIPID PANEL
CHOL/HDL RATIO: 2.7 ratio
CHOLESTEROL: 130 mg/dL (ref 0–200)
HDL: 49 mg/dL (ref >39)
LDL Cholesterol: 67 mg/dL (ref 0–99)
Triglycerides: 69 mg/dL (ref <150)
VLDL: 14 mg/dL (ref 0–40)

## 2013-02-19 LAB — IFOBT (OCCULT BLOOD): IFOBT: NEGATIVE

## 2013-02-19 MED ORDER — KNEE BRACE/HINGED BARS MEDIUM MISC: Status: DC

## 2013-02-19 MED ORDER — LISINOPRIL 10 MG PO TABS: 10.0000 mg | ORAL_TABLET | Freq: Every day | ORAL | Status: DC

## 2013-02-19 MED ORDER — DULOXETINE HCL 60 MG PO CPEP: 60.0000 mg | ORAL_CAPSULE | Freq: Two times a day (BID) | ORAL | Status: DC

## 2013-02-19 NOTE — Progress Notes (Signed)
Physical examination:  History: Patient is here for his yearly physical checkup and a note for the insurance carrier regarding his condition. He continues to have problems with pains in his neck and limited range of motion there. His shoulders are gotten so they do not move as well. He has more and more problems with the left knee which feels like it's going to give way. 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 He mostly is seated at that job though sometimes has to stand for long stretches. He has fears when he walks up and down stairs at his knee will give way. Other than this he feels like life is going fairly well.  Past history:  Medications: Cymbalta 120 mg daily  Lisinopril 10 mg daily  Medication allergies: None, however he is yellow jacket allergic which cause swelling.  Past medical history:  He has a history of a lot of anxiety and depression. He has arthritic problems in his neck. He has had problems with his ankles also. He has a history of excessive alcohol intake. Knee pain is a problem now in his left knee.  Past surgical history:  He has had ankle surgery and a T&A.   Family history:  He never knew his mother. She is deceased. His father is living, [id number], he has started to lose some of his memory, especially short-term memory. His siblings are living and well   Social history:  Patient is married. Has not had intercourse in the last year. He smokes about a third pack of cigarettes a day. He drinks about 24 beers weekly just to relax him. 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 Exercises by walking on his job every day.   Review of Systems:  Constitutional: Unremarkable  HEENT: Unremarkable  Eyes: Unremarkable  Respiratory unremarkable  Cardiovascular: Unremarkable  Gastrointestinal: Unremarkable  Genitourinary: Unremarkable  Musculoskeletal: Has joint pains in his knees and ankles neck shoulders arms. Left knee feels like it will  go out on him. He is interested in a brace for the knee.  Dermatologic: Unremarkable  Neurologic: Unremarkable  Hematologic: Unremarkable  Psychiatric: He is stable on his medications  Endocrinologic: Unremarkable  Physical examination: 63 year old male who looks good today. He has not been having the panic attacks any lately. His long time since he has had one. His TMs are normal. Eyes PERRLA. Fundi benign. He has new glasses. Throat was clear. Neck supple without nodes or thyromegaly. No carotid bruits. Chest is clear to auscultation. Heart regular without murmurs gallops or arrhythmias. Abdomen is soft without masses or tenderness. Normal male external genitalia with no hernias noted. Digital rectal exam reveals prostate gland to be normal in size with no nodules. Extremities are unremarkable except for the joints. His left knee has crepitance and is a little more hypertrophic than the right knee. It seems to have laxity when he shifts his weight on it. Shoulders have some decreased range of motion also. He cannot reach back well at all. Skin unremarkable.  Assessment: Physical examination Depression and anxiety, stable Hypertension good control Degenerative joint disease, especially shoulders and the left knee Tobacco use disorder Alcohol use disorder   Plan: Reviewed labs from last year.  Check his stool for occult blood. GU current medications.) Write the note that he needs.

## 2013-02-19 NOTE — Patient Instructions (Signed)
Consider using a cane if the knee gets too unstable  Try getting a hinged knee brace  Continue current medications  Do a lot of stretching exercises  Return in one year or sooner if needed

## 2013-02-21 ENCOUNTER — Encounter: Payer: Self-pay | Admitting: Family Medicine

## 2013-04-05 ENCOUNTER — Other Ambulatory Visit: Payer: Self-pay | Admitting: Family Medicine

## 2013-04-08 NOTE — Telephone Encounter (Signed)
Pt's wife called about this.  Ventura SellersSaid Carl saw Dr. Alwyn RenHopper in January and he was only given one of the scripts (she did not specify on the voicemail which medicine) for a three-month supply.  They thought they would be able to refill, but pharmacy said they would have to be seen.  She said pharmacy sent in request.  Please call [phone]

## 2013-08-05 ENCOUNTER — Other Ambulatory Visit: Payer: Self-pay | Admitting: Family Medicine

## 2013-08-11 ENCOUNTER — Other Ambulatory Visit: Payer: Self-pay | Admitting: Family Medicine

## 2013-09-02 ENCOUNTER — Other Ambulatory Visit: Payer: Self-pay | Admitting: Physician Assistant

## 2013-09-16 ENCOUNTER — Ambulatory Visit (INDEPENDENT_AMBULATORY_CARE_PROVIDER_SITE_OTHER): Payer: BC Managed Care – PPO | Admitting: Family Medicine

## 2013-09-16 VITALS — BP 126/74 | HR 70 | Temp 97.8°F | Resp 18 | Ht 69.0 in | Wt 209.0 lb

## 2013-09-16 DIAGNOSIS — F32A Depression, unspecified: Secondary | ICD-10-CM

## 2013-09-16 DIAGNOSIS — F3289 Other specified depressive episodes: Secondary | ICD-10-CM

## 2013-09-16 DIAGNOSIS — I10 Essential (primary) hypertension: Secondary | ICD-10-CM

## 2013-09-16 DIAGNOSIS — M1990 Unspecified osteoarthritis, unspecified site: Secondary | ICD-10-CM

## 2013-09-16 DIAGNOSIS — F329 Major depressive disorder, single episode, unspecified: Secondary | ICD-10-CM

## 2013-09-16 MED ORDER — LISINOPRIL 10 MG PO TABS: ORAL_TABLET | ORAL | Status: DC

## 2013-09-16 MED ORDER — DULOXETINE HCL 60 MG PO CPEP: ORAL_CAPSULE | ORAL | Status: DC

## 2013-09-16 MED ORDER — DICLOFENAC SODIUM 75 MG PO TBEC: DELAYED_RELEASE_TABLET | ORAL | Status: DC

## 2013-09-16 NOTE — Progress Notes (Signed)
Subjective: Patient has been doing fairly well. His emotions have been fairly stable on the Cymbalta. He cries easily. He is going to change his job, within the same company, so he does not have to stand so much. His joints bother him a lot. He's been having a lot of shoulder pain bilaterally. He takes his blood pressure medication. He describes popping in his left shoulder and crepitance.  Objective: No carotid bruits. Chest clear. Heart regular without murmurs. Abdomen soft without mass or tenderness. Decreased range of motion of both shoulders. Left shoulder is a little bit tender. There is a small amount of crepitance   Assessment: Shoulder pain Hypertension Depression  Plan: Same medications Try little diclofenac for the pain. Told him to not use it regularly. Return in 6 months Offered to x-ray it or inject the shoulder or referred elsewhere, he decided to do nothing else right now.

## 2013-09-16 NOTE — Patient Instructions (Signed)
Continue the lisinopril and Cymbalta as before  Try diclofenac one daily or twice daily if needed for bad arthritis pain  Continue to stay very active  Return as needed or again in about 6 months

## 2013-10-31 ENCOUNTER — Other Ambulatory Visit: Payer: Self-pay | Admitting: Internal Medicine

## 2013-10-31 NOTE — Telephone Encounter (Signed)
Dr Alwyn RenHopper, I wanted to check with you on RFs. At last OV at end of enc pt's med list has both diclofenac and meloxicam on it. Just wanted to make sure you want to give RFs?

## 2013-11-01 NOTE — Telephone Encounter (Signed)
Please call patient and see which one he is taking - he should not be taking both

## 2013-11-04 NOTE — Telephone Encounter (Signed)
LMOM on cell # to CB. 

## 2013-11-05 NOTE — Telephone Encounter (Signed)
Called pt who advised that he isn't sure what medication he had for his joints but he does need something. He stated that it was a little white pill. Called pharm to see which medication was white and they stated that he never p/up the Rx for voltaren Dr Alwyn RenHopper wrote on 09/16/13. I had them fill this for pt and LMOM for pt explaining that DR Alwyn RenHopper had wanted him to try this med and [facility] if it doesn't help as much or more than meloxicam.

## 2013-11-23 ENCOUNTER — Ambulatory Visit (INDEPENDENT_AMBULATORY_CARE_PROVIDER_SITE_OTHER): Payer: BC Managed Care – PPO

## 2013-11-23 ENCOUNTER — Other Ambulatory Visit: Payer: Self-pay | Admitting: *Deleted

## 2013-11-23 ENCOUNTER — Telehealth: Payer: Self-pay | Admitting: Family Medicine

## 2013-11-23 ENCOUNTER — Ambulatory Visit (INDEPENDENT_AMBULATORY_CARE_PROVIDER_SITE_OTHER): Payer: BC Managed Care – PPO | Admitting: Family Medicine

## 2013-11-23 VITALS — BP 117/77 | HR 91 | Temp 98.6°F | Resp 16 | Ht 68.5 in | Wt 206.2 lb

## 2013-11-23 DIAGNOSIS — R05 Cough: Secondary | ICD-10-CM

## 2013-11-23 DIAGNOSIS — R059 Cough, unspecified: Secondary | ICD-10-CM

## 2013-11-23 DIAGNOSIS — M1990 Unspecified osteoarthritis, unspecified site: Secondary | ICD-10-CM

## 2013-11-23 DIAGNOSIS — J019 Acute sinusitis, unspecified: Secondary | ICD-10-CM

## 2013-11-23 DIAGNOSIS — R9389 Abnormal findings on diagnostic imaging of other specified body structures: Secondary | ICD-10-CM

## 2013-11-23 DIAGNOSIS — J209 Acute bronchitis, unspecified: Secondary | ICD-10-CM

## 2013-11-23 LAB — POCT CBC
Granulocyte percent: 81.1 %G — AB (ref 37–80)
HEMATOCRIT: 46.9 % (ref 43.5–53.7)
HEMOGLOBIN: 15.6 g/dL (ref 14.1–18.1)
Lymph, poc: 1.3 (ref 0.6–3.4)
MCH: 29.1 pg (ref 27–31.2)
MCHC: 33.3 g/dL (ref 31.8–35.4)
MCV: 87.6 fL (ref 80–97)
MID (cbc): 0.7 (ref 0–0.9)
MPV: 7.7 fL (ref 0–99.8)
POC GRANULOCYTE: 8.6 — AB (ref 2–6.9)
POC LYMPH PERCENT: 12.5 %L (ref 10–50)
POC MID %: 6.4 %M (ref 0–12)
Platelet Count, POC: 182 10*3/uL (ref 142–424)
RBC: 5.35 M/uL (ref 4.69–6.13)
RDW, POC: 13.5 %
WBC: 10.6 10*3/uL — AB (ref 4.6–10.2)

## 2013-11-23 MED ORDER — DICLOFENAC SODIUM 75 MG PO TBEC: DELAYED_RELEASE_TABLET | ORAL | Status: DC

## 2013-11-23 MED ORDER — CEFDINIR 300 MG PO CAPS: 600.0000 mg | ORAL_CAPSULE | Freq: Every day | ORAL | Status: DC

## 2013-11-23 MED ORDER — ALBUTEROL SULFATE HFA 108 (90 BASE) MCG/ACT IN AERS: 2.0000 | INHALATION_SPRAY | RESPIRATORY_TRACT | Status: DC | PRN

## 2013-11-23 MED ORDER — HYDROCODONE-HOMATROPINE 5-1.5 MG/5ML PO SYRP: 5.0000 mL | ORAL_SOLUTION | ORAL | Status: DC | PRN

## 2013-11-23 MED ORDER — BENZONATATE 100 MG PO CAPS: 100.0000 mg | ORAL_CAPSULE | Freq: Three times a day (TID) | ORAL | Status: DC | PRN

## 2013-11-23 NOTE — Progress Notes (Addendum)
Subjective: 63 year old man well-known to me who is been sick for the last week and a half. He had an upper respiratory infection. He is persisted with a lot of postnasal drainage. He blows some thick mucus out of his nose at times. He continues to cough a lot and gets strangled on it, having a hard time bringing it up. He coughs a lot at night when he is laying. He does not smoke. His wife had a infection right before him.  Patient is upset about a phone call he received from this office changing him from the diclofenac to Mobic. I cannot tell why the change was made with a refill was called in when I was gone.  Objective: No acute distress but he appears tired. He apparently couldn't sleep well. His TMs are normal. He says the left ear canal is very sensitive, but it looks fine to me. Throat has some postnasal drainage. Neck supple without significant nodes. Chest  Has some rhonchi at the left base posteriorly. He has a little tight.  Assessment: Cough Sinusitis Rule out pneumonia  Plan: Chest x-ray and CBC  [facility] reading (PRIMARY) by  Dr. Alwyn RenHopper Normal chest x-ray except for question on one view of a possible vague nodular density between the third and fourth rib spaces anteriorly in the right upper lung. No acute infiltrate  Results for orders placed or performed in visit on 11/23/13  POCT CBC  Result Value Ref Range   WBC 10.6 (A) 4.6 - 10.2 K/uL   Lymph, poc 1.3 0.6 - 3.4   POC LYMPH PERCENT 12.5 10 - 50 %L   MID (cbc) 0.7 0 - 0.9   POC MID % 6.4 0 - 12 %M   POC Granulocyte 8.6 (A) 2 - 6.9   Granulocyte percent 81.1 (A) 37 - 80 %G   RBC 5.35 4.69 - 6.13 M/uL   Hemoglobin 15.6 14.1 - 18.1 g/dL   HCT, POC 69.646.9 29.543.5 - 53.7 %   MCV 87.6 80 - 97 fL   MCH, POC 29.1 27 - 31.2 pg   MCHC 33.3 31.8 - 35.4 g/dL   RDW, POC 28.413.5 %   Platelet Count, POC 182 142 - 424 K/uL   MPV 7.7 0 - 99.8 fL   . No evidence of pneumonia and this probably just represents a bronchitis with a mildly  elevated white count. Will treat with antibiotics and cough medication.  Discussed use of NSAIDs for arthritic pains. If he has GI problems he should discontinue them promptly.  Chest x-ray scan report noted the right upper lung spot. We will order a CT scan of the chest per the recommendation of the radiologist.

## 2013-11-23 NOTE — Patient Instructions (Addendum)
Drink plenty of fluids and get enough rest  Take Mucinex plain to help thin the secretions  Take the Hycodan cough syrup 1 teaspoon every 4 hours as needed. This will make you drowsy.  Take the benzonatate cough pills one or 2 pills 3 times daily as needed for cough when he would rather not be sedated by the cough syrup.  Take the antibiotic one twice daily for infection  Use the diclofenac one twice daily when needed for bad arthritic pain. For milder pain take Tylenol. If you have stomach pain or nausea please discontinue.

## 2013-11-25 NOTE — Telephone Encounter (Signed)
Opened in error when looking for phone number to let him know something right after visit.

## 2013-11-28 ENCOUNTER — Other Ambulatory Visit: Payer: Self-pay

## 2014-03-13 ENCOUNTER — Other Ambulatory Visit: Payer: Self-pay | Admitting: Family Medicine

## 2014-03-13 NOTE — Telephone Encounter (Signed)
You saw pt for check up in Nov but don't see depression discussed. Do you want to give RFs?

## 2014-04-05 ENCOUNTER — Ambulatory Visit (INDEPENDENT_AMBULATORY_CARE_PROVIDER_SITE_OTHER): Payer: BLUE CROSS/BLUE SHIELD | Admitting: Family Medicine

## 2014-04-05 VITALS — BP 108/66 | HR 80 | Temp 98.0°F | Resp 20 | Ht 68.5 in | Wt 208.0 lb

## 2014-04-05 DIAGNOSIS — I10 Essential (primary) hypertension: Secondary | ICD-10-CM | POA: Diagnosis not present

## 2014-04-05 DIAGNOSIS — Z0000 Encounter for general adult medical examination without abnormal findings: Secondary | ICD-10-CM

## 2014-04-05 DIAGNOSIS — F3162 Bipolar disorder, current episode mixed, moderate: Secondary | ICD-10-CM

## 2014-04-05 DIAGNOSIS — F32A Depression, unspecified: Secondary | ICD-10-CM

## 2014-04-05 DIAGNOSIS — F411 Generalized anxiety disorder: Secondary | ICD-10-CM

## 2014-04-05 DIAGNOSIS — F329 Major depressive disorder, single episode, unspecified: Secondary | ICD-10-CM

## 2014-04-05 MED ORDER — DIVALPROEX SODIUM 250 MG PO DR TAB: 250.0000 mg | DELAYED_RELEASE_TABLET | Freq: Three times a day (TID) | ORAL | Status: DC

## 2014-04-05 MED ORDER — DULOXETINE HCL 60 MG PO CPEP: 60.0000 mg | ORAL_CAPSULE | Freq: Two times a day (BID) | ORAL | Status: DC

## 2014-04-05 MED ORDER — LISINOPRIL 10 MG PO TABS: ORAL_TABLET | ORAL | Status: DC

## 2014-04-05 NOTE — Patient Instructions (Signed)
Continue taking the Cymbalta  Continue your blood pressure medication  Begin taking the new medicine, Depakote (valproate) 250 mg one pill daily for about 1 week, then increase to one twice daily. I would like you to return in about 3 months to reassess how you're doing on this. This is a low dose, but I hope that it helps to relax you a little bit.  Return at anytime if worse  I'll let you know the results of your labs in a few days and send things in to the company at that time.

## 2014-04-05 NOTE — Progress Notes (Signed)
Physical Exam:  History: 64 year old man well-known to me who is here for physical examination. He has a history of bipolar. He has done fairly well until recently on the Cymbalta, but does not feel like it is doing the full job on him now. He has more agitation and a temperature. He does not want to go back to a psychiatrist. He continues to work his job. He brought in a physical form for the job requiring some labs for his insurance purposes.  Past medical history: Medications: Cymbalta, lisinopril, aspirin Allergies: None Medical illnesses: Anxiety, depression, arthritis, bipolar Surgeries: None  Family history:  His mother is deceased. Actually I think that was his stepmother. Father still living and doing well. Siblings are well.  Social history: Smokes, no drug use. Married. He works as a Electrical engineersecurity guard. He has college education.  Review of systems:  Constitutional: Unremarkable except for being tired easily. We talked about that. HEENT: Unremarkable Respiratory: Unremarkable Cardiovascular: Unremarkable Gastrointestinal: Unremarkable Endocrine: Unremarkable Genitourinary: Unremarkable Musculoskeletal: Unremarkable Dermatologic: Unremarkable Neurologic: Unremarkable Hematologic: Unremarkable Hematologic: Unremarkable Psychiatric: As discussed above  Physical exam: Overweight gentleman, pleasant and fully alert and oriented. Hard of hearing. No other major complaints. Eyes PERRLA. Throat clear. TMs normal. Neck supple without nodes or thyromegaly. No carotid bruits. Chest is clear to auscultation. Heart regular without murmurs gallops or arrhythmias. Abdomen is obese, soft without organomegaly or masses or tenderness. He reports that he got a bruise on his right abdominal wall. He had the bad cough previously. It sounds like he had a muscle wall tear. Normal male external genitalia with no hernias. No testicular lesions. Digital rectal exam reveals prostate gland to be normal  in contour and mildly enlarged. Extremities without edema. Skin warm and dry.  Assessment: Annual physical examination History of bipolar with anxiety and depression Deafness Obesity Hypertension, controlled  Plan: Add Depakote 250 mg one daily for one week, then going up to twice daily. Continue Cymbalta. Return if needed sooner, otherwise we'll see him in about 3 months. Long discussion about his emotions.

## 2014-04-06 LAB — LIPID PANEL
CHOLESTEROL: 125 mg/dL (ref 0–200)
HDL: 49 mg/dL (ref >=40)
LDL Cholesterol: 57 mg/dL (ref 0–99)
Total CHOL/HDL Ratio: 2.6 Ratio
Triglycerides: 93 mg/dL (ref <150)
VLDL: 19 mg/dL (ref 0–40)

## 2014-04-06 LAB — HEMOGLOBIN A1C
HEMOGLOBIN A1C: 5.6 % (ref <5.7)
Mean Plasma Glucose: 114 mg/dL (ref <117)

## 2014-04-06 LAB — COMPREHENSIVE METABOLIC PANEL
ALT: 17 U/L (ref 0–53)
AST: 18 U/L (ref 0–37)
Albumin: 4.2 g/dL (ref 3.5–5.2)
Alkaline Phosphatase: 52 U/L (ref 39–117)
BUN: 12 mg/dL (ref 6–23)
CALCIUM: 9.2 mg/dL (ref 8.4–10.5)
CO2: 22 meq/L (ref 19–32)
CREATININE: 0.82 mg/dL (ref 0.50–1.35)
Chloride: 103 mEq/L (ref 96–112)
GLUCOSE: 106 mg/dL — AB (ref 70–99)
Potassium: 4.5 mEq/L (ref 3.5–5.3)
SODIUM: 134 meq/L — AB (ref 135–145)
Total Bilirubin: 1 mg/dL (ref 0.2–1.2)
Total Protein: 6.7 g/dL (ref 6.0–8.3)

## 2014-04-07 LAB — PSA: PSA: 1.66 ng/mL (ref <=4.00)

## 2014-04-08 ENCOUNTER — Encounter: Payer: Self-pay | Admitting: Family Medicine

## 2014-04-10 NOTE — Telephone Encounter (Signed)
lmom that labs will be there in a few days.

## 2014-04-10 NOTE — Telephone Encounter (Signed)
-----   Message from Peyton Najjaravid H Hopper, MD sent at 04/09/2014  9:55 PM EDT ----- Tell patient labs have been sent.  Should appear on My Chart in a few days.

## 2014-04-28 ENCOUNTER — Encounter: Payer: Self-pay | Admitting: Gastroenterology

## 2014-05-29 ENCOUNTER — Other Ambulatory Visit: Payer: Self-pay | Admitting: Family Medicine

## 2014-05-30 NOTE — Telephone Encounter (Signed)
Please advise on refill.

## 2014-07-31 ENCOUNTER — Ambulatory Visit (INDEPENDENT_AMBULATORY_CARE_PROVIDER_SITE_OTHER): Payer: BLUE CROSS/BLUE SHIELD | Admitting: Emergency Medicine

## 2014-07-31 ENCOUNTER — Ambulatory Visit (INDEPENDENT_AMBULATORY_CARE_PROVIDER_SITE_OTHER): Payer: BLUE CROSS/BLUE SHIELD

## 2014-07-31 DIAGNOSIS — S20212A Contusion of left front wall of thorax, initial encounter: Secondary | ICD-10-CM

## 2014-07-31 MED ORDER — HYDROCODONE-ACETAMINOPHEN 5-325 MG PO TABS: 1.0000 | ORAL_TABLET | ORAL | Status: DC | PRN

## 2014-07-31 NOTE — Patient Instructions (Signed)

## 2014-07-31 NOTE — Progress Notes (Signed)
Subjective:  Patient ID: Keith Cobb, male    DOB: 09-21-1950  Age: 64 y.o. MRN: [id number]  CC: Chest Pain; other; and Fatigue   HPI Keith Cobb presents  patient was trying to escape yellow jackets or bees and tried to run up a hill and slipped and fell landing on his Left chest. He's experiencing left anterior lateral chest pain and sensitivity since that time he says it increases when he coughs or sneezes or takes a deep breath. He has no hemoptysis. No exertional shortness of breath. No wheezing. Has no nausea or vomiting. No fever. His hemoptysis. Has no abdominal pain. Has no improvement with over-the-counter medication. History Keith Cobb has a past medical history of Hypertension; Arthritis; Anxiety; and Depression.   He has no past surgical history on file.   His  family history includes Stroke in his father.  He   reports that he has been smoking Cigarettes.  He has a 11.7 pack-year smoking history. He has never used smokeless tobacco. He reports that he drinks alcohol. He reports that he does not use illicit drugs.  Outpatient Prescriptions Prior to Visit  Medication Sig Dispense Refill  . diclofenac (VOLTAREN) 75 MG EC tablet TAKE ONE TABLET BY MOUTH TWICE DAILY WHEN  NEEDED  FOR  ARTHRITIS 30 tablet 0  . divalproex (DEPAKOTE) 250 MG DR tablet Take 1 tablet (250 mg total) by mouth 3 (three) times daily. 60 tablet 3  . DULoxetine (CYMBALTA) 60 MG capsule Take 1 capsule (60 mg total) by mouth 2 (two) times daily. 60 capsule 5  . lisinopril (PRINIVIL,ZESTRIL) 10 MG tablet Take one daily for blood pressure 90 tablet 3  . meloxicam (MOBIC) 15 MG tablet Take 1 tablet (15 mg total) by mouth daily. (Patient not taking: Reported on 07/31/2014) 30 tablet 2   No facility-administered medications prior to visit.    History   Social History  . Marital Status: Unknown    Spouse Name: N/A  . Number of Children: N/A  . Years of Education: N/A   Social History Main Topics    . Smoking status: Current Every Day Smoker -- 0.30 packs/day for 39 years    Types: Cigarettes  . Smokeless tobacco: Never Used  . Alcohol Use: 0.0 oz/week    0 Standard drinks or equivalent per week  . Drug Use: No  . Sexual Activity: Not on file   Other Topics Concern  . None   Social History Narrative     Review of Systems  Constitutional: Negative for fever, chills and appetite change.  HENT: Negative for congestion, ear pain, postnasal drip, sinus pressure and sore throat.   Eyes: Negative for pain and redness.  Respiratory: Negative for cough, shortness of breath and wheezing.   Cardiovascular: Negative for leg swelling.  Gastrointestinal: Negative for nausea, vomiting, abdominal pain, diarrhea, constipation and blood in stool.  Endocrine: Negative for polyuria.  Genitourinary: Negative for dysuria, urgency, frequency and flank pain.  Musculoskeletal: Negative for gait problem.  Skin: Negative for rash.  Neurological: Negative for weakness and headaches.  Psychiatric/Behavioral: Negative for confusion and decreased concentration. The patient is not nervous/anxious.     Objective:  There were no vitals taken for this visit.  Physical Exam  Constitutional: He is oriented to person, place, and time. He appears well-developed and well-nourished. No distress.  HENT:  Head: Normocephalic and atraumatic.  Right Ear: External ear normal.  Left Ear: External ear normal.  Nose: Nose normal.  Eyes:  Conjunctivae and EOM are normal. Pupils are equal, round, and reactive to light. No scleral icterus.  Neck: Normal range of motion. Neck supple. No tracheal deviation present.  Cardiovascular: Normal rate, regular rhythm and normal heart sounds.   Pulmonary/Chest: Effort normal. No respiratory distress. He has no wheezes. He has no rales.  Abdominal: He exhibits no mass. There is no tenderness. There is no rebound and no guarding.  Musculoskeletal: He exhibits no edema.   Lymphadenopathy:    He has no cervical adenopathy.  Neurological: He is alert and oriented to person, place, and time. Coordination normal.  Skin: Skin is warm and dry. No rash noted.  Psychiatric: He has a normal mood and affect. His behavior is normal.   he has mild left anterior chest wall tenderness. No crepitus or ecchymosis. No flail or instability.    Assessment & Plan:   Keith Cobb was seen today for chest pain, other and fatigue.  Diagnoses and all orders for this visit:  Chest wall contusion, left, initial encounter Orders: -     DG Chest 2 View; Future  Other orders -     HYDROcodone-acetaminophen (NORCO) 5-325 MG per tablet; Take 1-2 tablets by mouth every 4 (four) hours as needed.   I am having [name] start on HYDROcodone-acetaminophen. I am also having him maintain his meloxicam, DULoxetine, lisinopril, divalproex, and diclofenac.  Meds ordered this encounter  Medications  . HYDROcodone-acetaminophen (NORCO) 5-325 MG per tablet    Sig: Take 1-2 tablets by mouth every 4 (four) hours as needed.    Dispense:  30 tablet    Refill:  0    Appropriate red flag conditions were discussed with the patient as well as actions that should be taken.  Patient expressed his understanding.  Follow-up: Return if symptoms worsen or fail to improve.  Carmelina Dane, MD   [facility] reading (PRIMARY) by  Dr. Dareen Piano  negative.

## 2014-09-08 ENCOUNTER — Other Ambulatory Visit: Payer: Self-pay | Admitting: Family Medicine

## 2014-10-26 ENCOUNTER — Other Ambulatory Visit: Payer: Self-pay | Admitting: Family Medicine

## 2014-10-26 ENCOUNTER — Ambulatory Visit (INDEPENDENT_AMBULATORY_CARE_PROVIDER_SITE_OTHER): Payer: BLUE CROSS/BLUE SHIELD

## 2014-10-26 ENCOUNTER — Encounter: Payer: Self-pay | Admitting: Family Medicine

## 2014-11-05 ENCOUNTER — Other Ambulatory Visit: Payer: Self-pay

## 2014-11-05 DIAGNOSIS — F3162 Bipolar disorder, current episode mixed, moderate: Secondary | ICD-10-CM

## 2014-11-05 MED ORDER — DULOXETINE HCL 60 MG PO CPEP: 60.0000 mg | ORAL_CAPSULE | Freq: Two times a day (BID) | ORAL | Status: DC

## 2014-12-31 ENCOUNTER — Ambulatory Visit (INDEPENDENT_AMBULATORY_CARE_PROVIDER_SITE_OTHER): Payer: BLUE CROSS/BLUE SHIELD | Admitting: Family Medicine

## 2014-12-31 ENCOUNTER — Other Ambulatory Visit: Payer: Self-pay | Admitting: Family Medicine

## 2014-12-31 ENCOUNTER — Ambulatory Visit: Payer: BLUE CROSS/BLUE SHIELD

## 2014-12-31 ENCOUNTER — Ambulatory Visit (INDEPENDENT_AMBULATORY_CARE_PROVIDER_SITE_OTHER): Payer: BLUE CROSS/BLUE SHIELD

## 2014-12-31 VITALS — BP 138/80 | HR 94 | Temp 97.4°F | Resp 18 | Ht 69.0 in | Wt 217.2 lb

## 2014-12-31 DIAGNOSIS — M25562 Pain in left knee: Secondary | ICD-10-CM

## 2014-12-31 DIAGNOSIS — Z23 Encounter for immunization: Secondary | ICD-10-CM

## 2014-12-31 DIAGNOSIS — S42402A Unspecified fracture of lower end of left humerus, initial encounter for closed fracture: Secondary | ICD-10-CM | POA: Diagnosis not present

## 2014-12-31 DIAGNOSIS — Z8639 Personal history of other endocrine, nutritional and metabolic disease: Secondary | ICD-10-CM | POA: Diagnosis not present

## 2014-12-31 DIAGNOSIS — M7502 Adhesive capsulitis of left shoulder: Secondary | ICD-10-CM | POA: Diagnosis not present

## 2014-12-31 DIAGNOSIS — Z8739 Personal history of other diseases of the musculoskeletal system and connective tissue: Secondary | ICD-10-CM

## 2014-12-31 DIAGNOSIS — M25522 Pain in left elbow: Secondary | ICD-10-CM

## 2014-12-31 MED ORDER — HYDROCODONE-ACETAMINOPHEN 5-325 MG PO TABS: 1.0000 | ORAL_TABLET | Freq: Four times a day (QID) | ORAL | Status: DC | PRN

## 2014-12-31 MED ORDER — KETOROLAC TROMETHAMINE 60 MG/2ML IM SOLN
60.0000 mg | Freq: Once | INTRAMUSCULAR | Status: AC
Start: 2014-12-31 — End: 2014-12-31
  Administered 2014-12-31: 60 mg via INTRAMUSCULAR

## 2014-12-31 NOTE — Patient Instructions (Addendum)
Wear the splint  Take ibuprofen 200 mg 3 times daily as needed for pain  Take hydrocodone/APAP 1 every 4-6 hours as needed for additional pain relief  See the orthopedic doctor tomorrow. You need to call back tonight before 8:00 and let the team leader know who your requesting for orthopedics                                                      Influenza (Flu) Vaccine (Inactivated or Recombinant):  1. Why get vaccinated? Influenza ("flu") is a contagious disease that spreads around the [place] every year, usually between October and May. Flu is caused by influenza viruses, and is spread mainly by coughing, sneezing, and close contact. Anyone can get flu. Flu strikes suddenly and can last several days. Symptoms vary by age, but can include:  fever/chills  sore throat  muscle aches  fatigue  cough  headache  runny or stuffy nose Flu can also lead to pneumonia and blood infections, and cause diarrhea and seizures in children. If you have a medical condition, such as heart or lung disease, flu can make it worse. Flu is more dangerous for some people. Infants and young children, people 64 years of age and older, pregnant women, and people with certain health conditions or a weakened immune system are at greatest risk. Each year thousands of people in the [place] die from flu, and many more are hospitalized. Flu vaccine can:  keep you from getting flu,  make flu less severe if you do get it, and  keep you from spreading flu to your family and other people. 2. Inactivated and recombinant flu vaccines A dose of flu vaccine is recommended every flu season. Children 6 months through 40 years of age may need two doses during the same flu season. Everyone else needs only one dose each flu season. Some inactivated flu vaccines contain a very small amount of a mercury-based preservative called thimerosal. Studies have not shown thimerosal in vaccines to be harmful, but flu  vaccines that do not contain thimerosal are available. There is no live flu virus in flu shots. They cannot cause the flu. There are many flu viruses, and they are always changing. Each year a new flu vaccine is made to protect against three or four viruses that are likely to cause disease in the upcoming flu season. But even when the vaccine doesn't exactly match these viruses, it may still provide some protection. Flu vaccine cannot prevent:  flu that is caused by a virus not covered by the vaccine, or  illnesses that look like flu but are not. It takes about 2 weeks for protection to develop after vaccination, and protection lasts through the flu season. 3. Some people should not get this vaccine Tell the person who is giving you the vaccine:  If you have any severe, life-threatening allergies. If you ever had a life-threatening allergic reaction after a dose of flu vaccine, or have a severe allergy to any part of this vaccine, you may be advised not to get vaccinated. Most, but not all, types of flu vaccine contain a small amount of egg protein.  If you ever had Guillain-Barre Syndrome (also called GBS). Some people with a history of GBS should not get this vaccine. This should be discussed with your doctor.  If  you are not feeling well. It is usually okay to get flu vaccine when you have a mild illness, but you might be asked to come back when you feel better. 4. Risks of a vaccine reaction With any medicine, including vaccines, there is a chance of reactions. These are usually mild and go away on their own, but serious reactions are also possible. Most people who get a flu shot do not have any problems with it. Minor problems following a flu shot include:  soreness, redness, or swelling where the shot was given  hoarseness  sore, red or itchy eyes  cough  fever  aches  headache  itching  fatigue If these problems occur, they usually begin soon after the shot and last 1 or  2 days. More serious problems following a flu shot can include the following:  There may be a small increased risk of Guillain-Barre Syndrome (GBS) after inactivated flu vaccine. This risk has been estimated at 1 or 2 additional cases per million people vaccinated. This is much lower than the risk of severe complications from flu, which can be prevented by flu vaccine.  Young children who get the flu shot along with pneumococcal vaccine (PCV13) and/or DTaP vaccine at the same time might be slightly more likely to have a seizure caused by fever. Ask your doctor for more information. Tell your doctor if a child who is getting flu vaccine has ever had a seizure. Problems that could happen after any injected vaccine:  People sometimes faint after a medical procedure, including vaccination. Sitting or lying down for about 15 minutes can help prevent fainting, and injuries caused by a fall. Tell your doctor if you feel dizzy, or have vision changes or ringing in the ears.  Some people get severe pain in the shoulder and have difficulty moving the arm where a shot was given. This happens very rarely.  Any medication can cause a severe allergic reaction. Such reactions from a vaccine are very rare, estimated at about 1 in a million doses, and would happen within a few minutes to a few hours after the vaccination. As with any medicine, there is a very remote chance of a vaccine causing a serious injury or death. The safety of vaccines is always being monitored. For more information, visit: http://floyd.org/ 5. What if there is a serious reaction? What should I look for?  Look for anything that concerns you, such as signs of a severe allergic reaction, very high fever, or unusual behavior. Signs of a severe allergic reaction can include hives, swelling of the face and throat, difficulty breathing, a fast heartbeat, dizziness, and weakness. These would start a few minutes to a few hours after the  vaccination. What should I do?  If you think it is a severe allergic reaction or other emergency that can't wait, call 9-1-1 and get the person to the nearest hospital. Otherwise, call your doctor.  Reactions should be reported to the Vaccine Adverse Event Reporting System (VAERS). Your doctor should file this report, or you can do it yourself through the VAERS web site at www.vaers.LAgents.no, or by calling 1-807-837-3985. VAERS does not give medical advice. 6. The National Vaccine Injury Compensation Program The [employer] Vaccine Injury Compensation Program (VICP) is a federal program that was created to compensate people who may have been injured by certain vaccines. Persons who believe they may have been injured by a vaccine can learn about the program and about filing a claim by calling 1-651-712-8290 or visiting  the VICP website at SpiritualWord.atwww.hrsa.gov/vaccinecompensation. There is a time limit to file a claim for compensation. 7. How can I learn more?  Ask your healthcare provider. He or she can give you the vaccine package insert or suggest other sources of information.  Call your local or state health department.  Contact the Centers for Disease Control and Prevention (CDC):  Call 502-053-30671-410-475-4597 (1-800-CDC-INFO) or  Visit CDC's website at BiotechRoom.com.cywww.cdc.gov/flu Vaccine Information Statement Inactivated Influenza Vaccine (08/16/2013)   This information is not intended to replace advice given to you by your health care provider. Make sure you discuss any questions you have with your health care provider.   Document Released: 10/21/2005 Document Revised: 01/17/2014 Document Reviewed: 08/19/2013 Elsevier Interactive Patient Education [employer]2016 [employer]

## 2014-12-31 NOTE — Progress Notes (Signed)
Patient ID: Keith Cobb, male    DOB: Apr 07, 1950  Age: 64 y.o. MRN: [id number]  Chief Complaint  Patient presents with  . Elbow Pain    left elbow. unable to move  . Immunizations    flu vaccine    Subjective:   Patient is here for the above and some other problems. He has a history of gout in the past. He developed acute left elbow pain yesterday with no known trauma it hurts terribly. He hurts in his left shoulder and wrist also. He's been having problems with a frozen left shoulder losing range of motion of it for a good while. He hurts in his left knee also. No injuries there. It's bigger than the right and hurts him all the time. He wants a flu shot. No known injuries. 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  Current allergies, medications, problem list, past/family and social histories reviewed.  Objective:  BP 138/80 mmHg  Pulse 94  Temp(Src) 97.4 F (36.3 C) (Oral)  Resp 18  Ht  (1.753 m)  Wt 217 lb 3.2 oz (98.521 kg)  BMI 32.06 kg/m2  SpO2 98%  Holding his arm very carefully. Has very decreased range of motion of the left shoulder even on passive range of motion. Left elbow is severely tender. Not erythematous or hot to touch. Maybe a little swollen, too tender to really palpate carefully. Seems similar encounters to the right elbow. The left wrist had 1 little area of tenderness but is not really very tender and has good range of motion. Hand function is okay though when he grips ahead the he screams with pain on up his arm. Neck has fair range of motion.  Left knee has some crepitance and tenderness. No definite effusion. He feels full on the back like he could have a Baker cyst.  [facility] reading (PRIMARY) by  Dr. Alwyn Ren Left shoulder degenerative changes and possible old fracture Left knee joint space narrowing, osteoarthritis . [facility] reading (PRIMARY) by  Dr. Alwyn Ren Probable fracture distal humerus and possible  radial fracture. Unable to get additional views.  .     Assessment & Plan:   Assessment: 1. Pain, elbow joint, left   2. Frozen shoulder, left   3. Left knee pain   4. History of gout   5. Needs flu shot   6. Fracture of distal humerus, left, closed, initial encounter       Plan: X-ray shoulder elbow and knee on the left. Check a uric acid. Give him a shot of ketorolac and a flu shot.  Orders Placed This Encounter  Procedures  . DG Shoulder Left    Order Specific Question:  Reason for Exam (SYMPTOM  OR DIAGNOSIS REQUIRED)    Answer:  chronic pain    Order Specific Question:  Preferred imaging location?    Answer:  External  . DG Elbow 2 Views Left    Standing Status: Future     Number of Occurrences: 1     Standing Expiration Date: 03/02/2016    Order Specific Question:  Reason for Exam (SYMPTOM  OR DIAGNOSIS REQUIRED)    Answer:  left elbow pain    Order Specific Question:  Preferred imaging location?    Answer:  External  . Flu Vaccine QUAD 36+ mos IM  . Uric acid    Meds ordered this encounter  Medications  . ketorolac (TORADOL) injection 60 mg  Sig:          Patient Instructions  Wear the splint  Take ibuprofen 200 mg 3 times daily as needed for pain  Take hydrocodone/APAP 1 every 4-6 hours as needed for additional pain relief  See the orthopedic doctor tomorrow. You need to call back tonight before 8:00 and let the team leader know who your requesting for orthopedics                                                      Influenza (Flu) Vaccine (Inactivated or Recombinant):  1. Why get vaccinated? Influenza ("flu") is a contagious disease that spreads around the [place] every year, usually between October and May. Flu is caused by influenza viruses, and is spread mainly by coughing, sneezing, and close contact. Anyone can get flu. Flu strikes suddenly and can last several days. Symptoms vary by age, but can include:  fever/chills  sore  throat  muscle aches  fatigue  cough  headache  runny or stuffy nose Flu can also lead to pneumonia and blood infections, and cause diarrhea and seizures in children. If you have a medical condition, such as heart or lung disease, flu can make it worse. Flu is more dangerous for some people. Infants and young children, people 66 years of age and older, pregnant women, and people with certain health conditions or a weakened immune system are at greatest risk. Each year thousands of people in the [place] die from flu, and many more are hospitalized. Flu vaccine can:  keep you from getting flu,  make flu less severe if you do get it, and  keep you from spreading flu to your family and other people. 2. Inactivated and recombinant flu vaccines A dose of flu vaccine is recommended every flu season. Children 6 months through 30 years of age may need two doses during the same flu season. Everyone else needs only one dose each flu season. Some inactivated flu vaccines contain a very small amount of a mercury-based preservative called thimerosal. Studies have not shown thimerosal in vaccines to be harmful, but flu vaccines that do not contain thimerosal are available. There is no live flu virus in flu shots. They cannot cause the flu. There are many flu viruses, and they are always changing. Each year a new flu vaccine is made to protect against three or four viruses that are likely to cause disease in the upcoming flu season. But even when the vaccine doesn't exactly match these viruses, it may still provide some protection. Flu vaccine cannot prevent:  flu that is caused by a virus not covered by the vaccine, or  illnesses that look like flu but are not. It takes about 2 weeks for protection to develop after vaccination, and protection lasts through the flu season. 3. Some people should not get this vaccine Tell the person who is giving you the vaccine:  If you have any severe,  life-threatening allergies. If you ever had a life-threatening allergic reaction after a dose of flu vaccine, or have a severe allergy to any part of this vaccine, you may be advised not to get vaccinated. Most, but not all, types of flu vaccine contain a small amount of egg protein.  If you ever had Guillain-Barre Syndrome (also called GBS). Some people with a history of GBS  should not get this vaccine. This should be discussed with your doctor.  If you are not feeling well. It is usually okay to get flu vaccine when you have a mild illness, but you might be asked to come back when you feel better. 4. Risks of a vaccine reaction With any medicine, including vaccines, there is a chance of reactions. These are usually mild and go away on their own, but serious reactions are also possible. Most people who get a flu shot do not have any problems with it. Minor problems following a flu shot include:  soreness, redness, or swelling where the shot was given  hoarseness  sore, red or itchy eyes  cough  fever  aches  headache  itching  fatigue If these problems occur, they usually begin soon after the shot and last 1 or 2 days. More serious problems following a flu shot can include the following:  There may be a small increased risk of Guillain-Barre Syndrome (GBS) after inactivated flu vaccine. This risk has been estimated at 1 or 2 additional cases per million people vaccinated. This is much lower than the risk of severe complications from flu, which can be prevented by flu vaccine.  Young children who get the flu shot along with pneumococcal vaccine (PCV13) and/or DTaP vaccine at the same time might be slightly more likely to have a seizure caused by fever. Ask your doctor for more information. Tell your doctor if a child who is getting flu vaccine has ever had a seizure. Problems that could happen after any injected vaccine:  People sometimes faint after a medical procedure, including  vaccination. Sitting or lying down for about 15 minutes can help prevent fainting, and injuries caused by a fall. Tell your doctor if you feel dizzy, or have vision changes or ringing in the ears.  Some people get severe pain in the shoulder and have difficulty moving the arm where a shot was given. This happens very rarely.  Any medication can cause a severe allergic reaction. Such reactions from a vaccine are very rare, estimated at about 1 in a million doses, and would happen within a few minutes to a few hours after the vaccination. As with any medicine, there is a very remote chance of a vaccine causing a serious injury or death. The safety of vaccines is always being monitored. For more information, visit: http://floyd.org/www.cdc.gov/vaccinesafety/ 5. What if there is a serious reaction? What should I look for?  Look for anything that concerns you, such as signs of a severe allergic reaction, very high fever, or unusual behavior. Signs of a severe allergic reaction can include hives, swelling of the face and throat, difficulty breathing, a fast heartbeat, dizziness, and weakness. These would start a few minutes to a few hours after the vaccination. What should I do?  If you think it is a severe allergic reaction or other emergency that can't wait, call 9-1-1 and get the person to the nearest hospital. Otherwise, call your doctor.  Reactions should be reported to the Vaccine Adverse Event Reporting System (VAERS). Your doctor should file this report, or you can do it yourself through the VAERS web site at www.vaers.LAgents.nohhs.gov, or by calling 1-763-860-7488. VAERS does not give medical advice. 6. The National Vaccine Injury Compensation Program The [employer] Vaccine Injury Compensation Program (VICP) is a federal program that was created to compensate people who may have been injured by certain vaccines. Persons who believe they may have been injured by a vaccine can learn  about the program and about filing a  claim by calling 1-(321)144-4316 or visiting the VICP website at SpiritualWord.at. There is a time limit to file a claim for compensation. 7. How can I learn more?  Ask your healthcare provider. He or she can give you the vaccine package insert or suggest other sources of information.  Call your local or state health department.  Contact the Centers for Disease Control and Prevention (CDC):  Call 320 470 5452 (1-800-CDC-INFO) or  Visit CDC's website at BiotechRoom.com.cy Vaccine Information Statement Inactivated Influenza Vaccine (08/16/2013)   This information is not intended to replace advice given to you by your health care provider. Make sure you discuss any questions you have with your health care provider.   Document Released: 10/21/2005 Document Revised: 01/17/2014 Document Reviewed: 08/19/2013 Elsevier Interactive Patient Education [employer]      Return if symptoms worsen or fail to improve.   HOPPER,DAVID, MD 12/31/2014

## 2015-01-01 ENCOUNTER — Other Ambulatory Visit: Payer: Self-pay | Admitting: Family Medicine

## 2015-01-01 LAB — URIC ACID: URIC ACID, SERUM: 6 mg/dL (ref 4.0–7.8)

## 2015-01-02 ENCOUNTER — Encounter: Payer: Self-pay | Admitting: Family Medicine

## 2015-01-02 ENCOUNTER — Other Ambulatory Visit: Payer: Self-pay | Admitting: Family Medicine

## 2015-01-02 DIAGNOSIS — F3162 Bipolar disorder, current episode mixed, moderate: Secondary | ICD-10-CM

## 2015-01-07 ENCOUNTER — Other Ambulatory Visit: Payer: Self-pay | Admitting: Family Medicine

## 2015-01-07 DIAGNOSIS — F3162 Bipolar disorder, current episode mixed, moderate: Secondary | ICD-10-CM

## 2015-01-07 MED ORDER — DICLOFENAC SODIUM 75 MG PO TBEC: 75.0000 mg | DELAYED_RELEASE_TABLET | Freq: Two times a day (BID) | ORAL | Status: DC

## 2015-01-07 MED ORDER — DULOXETINE HCL 60 MG PO CPEP: 60.0000 mg | ORAL_CAPSULE | Freq: Two times a day (BID) | ORAL | Status: DC

## 2015-01-07 MED ORDER — DIVALPROEX SODIUM 250 MG PO DR TAB: DELAYED_RELEASE_TABLET | ORAL | Status: DC

## 2015-02-10 ENCOUNTER — Other Ambulatory Visit: Payer: Self-pay | Admitting: Family Medicine

## 2015-02-11 ENCOUNTER — Other Ambulatory Visit: Payer: Self-pay | Admitting: Family Medicine

## 2015-02-12 ENCOUNTER — Other Ambulatory Visit: Payer: Self-pay | Admitting: Family Medicine

## 2015-02-24 MED ORDER — DICLOFENAC SODIUM 75 MG PO TBEC: 75.0000 mg | DELAYED_RELEASE_TABLET | Freq: Two times a day (BID) | ORAL | Status: DC

## 2015-02-24 NOTE — Telephone Encounter (Signed)
Refilled medications

## 2015-02-24 NOTE — Addendum Note (Signed)
Addended by: Morrell Riddle on: 02/24/2015 12:30 PM   Modules accepted: Orders

## 2015-03-10 ENCOUNTER — Other Ambulatory Visit: Payer: Self-pay

## 2015-03-10 DIAGNOSIS — F3162 Bipolar disorder, current episode mixed, moderate: Secondary | ICD-10-CM

## 2015-03-10 MED ORDER — DULOXETINE HCL 60 MG PO CPEP: 60.0000 mg | ORAL_CAPSULE | Freq: Two times a day (BID) | ORAL | Status: DC

## 2015-04-08 ENCOUNTER — Other Ambulatory Visit: Payer: Self-pay | Admitting: Family Medicine

## 2015-05-04 ENCOUNTER — Ambulatory Visit (INDEPENDENT_AMBULATORY_CARE_PROVIDER_SITE_OTHER): Payer: BLUE CROSS/BLUE SHIELD | Admitting: Physician Assistant

## 2015-05-04 VITALS — BP 106/78 | HR 87 | Temp 97.3°F | Resp 20 | Ht 68.25 in | Wt 211.2 lb

## 2015-05-04 DIAGNOSIS — Z23 Encounter for immunization: Secondary | ICD-10-CM | POA: Diagnosis not present

## 2015-05-04 DIAGNOSIS — Z139 Encounter for screening, unspecified: Secondary | ICD-10-CM

## 2015-05-04 DIAGNOSIS — Z0000 Encounter for general adult medical examination without abnormal findings: Secondary | ICD-10-CM | POA: Diagnosis not present

## 2015-05-04 DIAGNOSIS — Z720 Tobacco use: Secondary | ICD-10-CM | POA: Diagnosis not present

## 2015-05-04 DIAGNOSIS — F17200 Nicotine dependence, unspecified, uncomplicated: Secondary | ICD-10-CM

## 2015-05-04 LAB — COMPLETE METABOLIC PANEL WITH GFR
ALT: 45 U/L (ref 9–46)
AST: 39 U/L — ABNORMAL HIGH (ref 10–35)
Albumin: 4.3 g/dL (ref 3.6–5.1)
Alkaline Phosphatase: 46 U/L (ref 40–115)
BILIRUBIN TOTAL: 0.9 mg/dL (ref 0.2–1.2)
BUN: 8 mg/dL (ref 7–25)
CALCIUM: 8.9 mg/dL (ref 8.6–10.3)
CO2: 26 mmol/L (ref 20–31)
CREATININE: 0.82 mg/dL (ref 0.70–1.25)
Chloride: 100 mmol/L (ref 98–110)
Glucose, Bld: 88 mg/dL (ref 65–99)
Potassium: 4.3 mmol/L (ref 3.5–5.3)
Sodium: 137 mmol/L (ref 135–146)
Total Protein: 6.6 g/dL (ref 6.1–8.1)

## 2015-05-04 LAB — HIV ANTIBODY (ROUTINE TESTING W REFLEX): HIV 1&2 Ab, 4th Generation: NONREACTIVE

## 2015-05-04 LAB — HEPATITIS C ANTIBODY: HCV AB: NEGATIVE

## 2015-05-04 MED ORDER — VARENICLINE TARTRATE 1 MG PO TABS: 1.0000 mg | ORAL_TABLET | Freq: Two times a day (BID) | ORAL | Status: DC

## 2015-05-04 MED ORDER — VARENICLINE TARTRATE 0.5 MG X 11 & 1 MG X 42 PO MISC: ORAL | Status: DC

## 2015-05-04 NOTE — Progress Notes (Signed)
05/04/2015 5:13 PM   DOB: [birth year] / MRN: [id number]  SUBJECTIVE:  Keith Cobb is a 65 y.o. male presenting for an annual physical.  He continues to smoke and is interested in instituting a plan to stop, but is not ready to stop today.  He would like to have a medication on hand that would be helpful. He can not remember the last time he had a tetanus shot.  Has history of HTN and this has been well controlled on Lisinopril.  No history of Hep C screening or HIV screening exist in [facility].  Last colonoscopy was in 2004 and he knows he needs to get this done.  He declines the PSA screening after a discussion of the risk vs the benefits.    He has had a difficult life and is at odds with his son over divorce.  Has not talked to his son in over 10 years and this has been difficult for him.  He drinks on most days of the week, sometimes 10 beers.  States he enjoys drinking but knows it is bad for him.   Immunization History  Administered Date(s) Administered  . Influenza,inj,Quad PF,36+ Mos 10/30/2012, 12/31/2014     He is allergic to bee venom.   He  has a past medical history of Hypertension; Arthritis; Anxiety; and Depression.    He  reports that he has been smoking Cigarettes.  He has a 11.7 pack-year smoking history. He has never used smokeless tobacco. He reports that he drinks alcohol. He reports that he does not use illicit drugs. He  has no sexual activity history on file. The patient  has no past surgical history on file.  His family history includes Stroke in his father.  Review of Systems  Constitutional: Negative for fever and chills.  Eyes: Negative for blurred vision.  Respiratory: Negative for cough and shortness of breath.   Cardiovascular: Negative for chest pain.  Gastrointestinal: Negative for nausea and abdominal pain.  Genitourinary: Negative for dysuria, urgency and frequency.  Musculoskeletal: Negative for myalgias.  Skin: Negative for rash.  Neurological:  Negative for dizziness, tingling and headaches.  Psychiatric/Behavioral: Negative for depression. The patient is not nervous/anxious.     Problem list and medications reviewed and updated by myself where necessary, and exist elsewhere in the encounter.   OBJECTIVE:  BP 106/78 mmHg  Pulse 87  Temp(Src) 97.3 F (36.3 C) (Oral)  Resp 20  Ht 5' 8.25" (1.734 m)  Wt 211 lb 3.2 oz (95.8 kg)  BMI 31.86 kg/m2  SpO2 98%  Physical Exam  Constitutional: He is oriented to person, place, and time. He appears well-developed. He does not appear ill.  Eyes: Conjunctivae and EOM are normal. Pupils are equal, round, and reactive to light.  Cardiovascular: Normal rate and regular rhythm.   Pulmonary/Chest: Effort normal and breath sounds normal.  Abdominal: He exhibits no distension.  Musculoskeletal: Normal range of motion.  Neurological: He is alert and oriented to person, place, and time. No cranial nerve deficit. Coordination normal.  Skin: Skin is warm and dry. He is not diaphoretic.  Psychiatric: He has a normal mood and affect.  Nursing note and vitals reviewed.   No results found for this or any previous visit (from the past 72 hour(s)).  No results found.  ASSESSMENT AND PLAN  Keith Cobb was seen today for annual exam.  Diagnoses and all orders for this visit:  Annual physical exam  Screening -     Ambulatory  referral to Gastroenterology -     COMPLETE METABOLIC PANEL WITH GFR -     CBC with Differential/Platelet -     Hemoglobin A1c -     Hepatitis C antibody -     HIV antibody  Smoker -     varenicline (CHANTIX CONTINUING MONTH PAK) 1 MG tablet; Take 1 tablet (1 mg total) by mouth 2 (two) times daily. -     varenicline (CHANTIX STARTING MONTH PAK) 0.5 MG X 11 & 1 MG X 42 tablet; Take one 0.5 mg tablet by mouth once daily for 3 days, then increase to one 0.5 mg tablet twice daily for 4 days, then increase to one 1 mg tablet twice daily.  Need for  diphtheria-tetanus-pertussis (Tdap) vaccine, adult/adolescent -     Tdap vaccine greater than or equal to 7yo IM    The patient was advised to call or return to clinic if he does not see an improvement in symptoms or to seek the care of the closest emergency department if he worsens with the above plan.   Deliah BostonMichael Vikki Gains, MHS, PA-C [facility] 05/04/2015 5:13 PM

## 2015-05-04 NOTE — Patient Instructions (Signed)
     IF you received an x-ray today, you will receive an invoice from [facility]. Please contact [facility] at [phone] with questions or concerns regarding your invoice.   IF you received labwork today, you will receive an invoice from [facility]. Please contact [facility] at [phone] with questions or concerns regarding your invoice.   Our billing staff will not be able to assist you with questions regarding bills from these companies.  You will be contacted with the lab results as soon as they are available. The fastest way to get your results is to activate your My Chart account. Instructions are located on the last page of this paperwork. If you have not heard from us regarding the results in 2 weeks, please contact this office.      

## 2015-05-05 LAB — CBC WITH DIFFERENTIAL/PLATELET
BASOS PCT: 0 %
Basophils Absolute: 0 cells/uL (ref 0–200)
EOS PCT: 2 %
Eosinophils Absolute: 108 cells/uL (ref 15–500)
HCT: 48.1 % (ref 38.5–50.0)
Hemoglobin: 16.3 g/dL (ref 13.2–17.1)
LYMPHS PCT: 23 %
Lymphs Abs: 1242 cells/uL (ref 850–3900)
MCH: 30.8 pg (ref 27.0–33.0)
MCHC: 33.9 g/dL (ref 32.0–36.0)
MCV: 90.8 fL (ref 80.0–100.0)
MONOS PCT: 16 %
MPV: 9.9 fL (ref 7.5–12.5)
Monocytes Absolute: 864 cells/uL (ref 200–950)
NEUTROS ABS: 3186 {cells}/uL (ref 1500–7800)
Neutrophils Relative %: 59 %
PLATELETS: 169 10*3/uL (ref 140–400)
RBC: 5.3 MIL/uL (ref 4.20–5.80)
RDW: 14.4 % (ref 11.0–15.0)
WBC: 5.4 10*3/uL (ref 3.8–10.8)

## 2015-05-05 LAB — HEMOGLOBIN A1C
HEMOGLOBIN A1C: 5.3 % (ref <5.7)
MEAN PLASMA GLUCOSE: 105 mg/dL

## 2015-05-13 ENCOUNTER — Other Ambulatory Visit: Payer: Self-pay | Admitting: Family Medicine

## 2015-05-13 ENCOUNTER — Other Ambulatory Visit: Payer: Self-pay | Admitting: Physician Assistant

## 2015-05-13 DIAGNOSIS — F3162 Bipolar disorder, current episode mixed, moderate: Secondary | ICD-10-CM

## 2015-05-14 ENCOUNTER — Other Ambulatory Visit: Payer: Self-pay | Admitting: Family Medicine

## 2015-05-14 ENCOUNTER — Encounter: Payer: Self-pay | Admitting: Physician Assistant

## 2015-05-14 MED ORDER — DULOXETINE HCL 60 MG PO CPEP: 60.0000 mg | ORAL_CAPSULE | Freq: Two times a day (BID) | ORAL | Status: DC

## 2015-05-14 NOTE — Addendum Note (Signed)
Addended by: Cydney OkAUGUSTIN, Gennie Dib N on: 05/14/2015 04:55 PM   Modules accepted: Orders

## 2015-05-15 MED ORDER — DICLOFENAC SODIUM 75 MG PO TBEC: 75.0000 mg | DELAYED_RELEASE_TABLET | Freq: Two times a day (BID) | ORAL | Status: DC

## 2015-05-15 NOTE — Telephone Encounter (Signed)
Done

## 2015-05-15 NOTE — Addendum Note (Signed)
Addended by: Morrell RiddleWEBER, SARAH L on: 05/15/2015 09:28 AM   Modules accepted: Orders

## 2015-06-04 ENCOUNTER — Other Ambulatory Visit: Payer: Self-pay | Admitting: Family Medicine

## 2015-06-05 ENCOUNTER — Encounter: Payer: Self-pay | Admitting: Family Medicine

## 2015-06-05 ENCOUNTER — Encounter: Payer: Self-pay | Admitting: Physician Assistant

## 2015-06-12 NOTE — Telephone Encounter (Signed)
Notified pt on mychart that his RFs were sent.

## 2015-08-11 ENCOUNTER — Other Ambulatory Visit: Payer: Self-pay

## 2015-08-11 MED ORDER — DIVALPROEX SODIUM 250 MG PO DR TAB: DELAYED_RELEASE_TABLET | ORAL | 2 refills | Status: DC

## 2015-08-11 NOTE — Telephone Encounter (Signed)
Keith Cobb, received fax from pharm asking for full 30 day RF of depakote, because the Rx on file is only for #60 but sig is TID dosing. This would be pt's final RF. Do you want to give him more since he was in for CPE in April, or does pt need to come back in for f/up on bipolar?

## 2015-08-12 ENCOUNTER — Other Ambulatory Visit: Payer: Self-pay | Admitting: Physician Assistant

## 2015-08-12 ENCOUNTER — Encounter: Payer: Self-pay | Admitting: Family Medicine

## 2015-08-12 DIAGNOSIS — F3162 Bipolar disorder, current episode mixed, moderate: Secondary | ICD-10-CM

## 2015-08-12 MED ORDER — DULOXETINE HCL 60 MG PO CPEP: 60.0000 mg | ORAL_CAPSULE | Freq: Two times a day (BID) | ORAL | 0 refills | Status: DC

## 2015-08-23 ENCOUNTER — Other Ambulatory Visit: Payer: Self-pay | Admitting: Family Medicine

## 2015-09-03 ENCOUNTER — Other Ambulatory Visit: Payer: Self-pay | Admitting: Physician Assistant

## 2015-10-05 ENCOUNTER — Ambulatory Visit (INDEPENDENT_AMBULATORY_CARE_PROVIDER_SITE_OTHER): Payer: BLUE CROSS/BLUE SHIELD | Admitting: Family Medicine

## 2015-10-05 VITALS — BP 112/60 | HR 88 | Temp 97.4°F | Resp 17 | Ht 68.25 in | Wt 204.0 lb

## 2015-10-05 DIAGNOSIS — F3162 Bipolar disorder, current episode mixed, moderate: Secondary | ICD-10-CM

## 2015-10-05 DIAGNOSIS — I10 Essential (primary) hypertension: Secondary | ICD-10-CM

## 2015-10-05 MED ORDER — LISINOPRIL 10 MG PO TABS: ORAL_TABLET | ORAL | 1 refills | Status: DC

## 2015-10-05 MED ORDER — LISINOPRIL 10 MG PO TABS: ORAL_TABLET | ORAL | 2 refills | Status: DC

## 2015-10-05 MED ORDER — MULTI-VITAMIN/MINERALS PO TABS: 1.0000 | ORAL_TABLET | Freq: Every day | ORAL | 6 refills | Status: DC

## 2015-10-05 NOTE — Patient Instructions (Addendum)
Continue Cymbalta 60 mg two times daily. Continue lisinopril 10 mg once daily for blood pressure.  I will hold off on refilling the Depakote for now.  You will return in 4 weeks or sooner if needed it you began to experience depression or change in mood or feeling.  Ok to start a multi-vitamin.  I wish you continues success with smoking cessation!  It was a pleasure meeting you !  Joaquin CourtsKimberly Harris, FNP-C  IF you received an x-ray today, you [facility]. Please contact [facility] at [phone] with questions or concerns regarding your invoice.   IF you received labwork today, you will receive an invoice from [facility]. Please contact [facility] at [phone] with questions or concerns regarding your invoice.   Our billing staff will not be able to assist you with questions regarding bills from these companies.  You will be contacted with the lab results as soon as they are available. The fastest way to get your results is to activate your My Chart account. Instructions are located on the last page of this paperwork. If you have not heard from us regarding the results in 2 weeks, please contact this office.   Smoking Cessation, Tips for Success If you are ready to quit smoking, congratulations! You have chosen to help yourself be healthier. Cigarettes bring nicotine, tar, carbon monoxide, and other irritants into your body. Your lungs, heart, and blood vessels will be able to work better without these poisons. There are many different ways to quit smoking. Nicotine gum, nicotine patches, a nicotine inhaler, or nicotine nasal spray can help with physical craving. Hypnosis, support groups, and medicines help break the habit of smoking. WHAT THINGS CAN I DO TO MAKE QUITTING EASIER?  Here are some tips to help you quit for good:  Pick a date when you will quit smoking completely. Tell all of your friends and family  about your plan to quit on that date.  Do not try to slowly cut down on the number of cigarettes you are smoking. Pick a quit date and quit smoking completely starting on that day.  Throw away all cigarettes.   Clean and remove all ashtrays from your home, work, and car.  On a card, write down your reasons for quitting. Carry the card with you and read it when you get the urge to smoke.  Cleanse your body of nicotine. Drink enough water and fluids to keep your urine clear or pale yellow. Do this after quitting to flush the nicotine from your body.  Learn to predict your moods. Do not let a bad situation be your excuse to have a cigarette. Some situations in your life might tempt you into wanting a cigarette.  Never have "just one" cigarette. It leads to wanting another and another. Remind yourself of your decision to quit.  Change habits associated with smoking. If you smoked while driving or when feeling stressed, try other activities to replace smoking. Stand up when drinking your coffee. Brush your teeth after eating. Sit in a different chair when you read the paper. Avoid alcohol while trying to quit, and try to drink fewer caffeinated beverages. Alcohol and caffeine may urge you to smoke.  Avoid foods and drinks that can trigger a desire to smoke, such as sugary or spicy foods and alcohol.  Ask people who smoke not to smoke around you.  Have something planned to do right after eating or having a cup of coffee.  For example, plan to take a walk or exercise.  Try a relaxation exercise to calm you down and decrease your stress. Remember, you may be tense and nervous for the first 2 weeks after you quit, but this will pass.  Find new activities to keep your hands busy. Play with a pen, coin, or rubber band. Doodle or draw things on paper.  Brush your teeth right after eating. This will help cut down on the craving for the taste of tobacco after meals. You can also try mouthwash.   Use  oral substitutes in place of cigarettes. Try using lemon drops, carrots, cinnamon sticks, or chewing gum. Keep them handy so they are available when you have the urge to smoke.  When you have the urge to smoke, try deep breathing.  Designate your home as a nonsmoking area.  If you are a heavy smoker, ask your health care provider about a prescription for nicotine chewing gum. It can ease your withdrawal from nicotine.  Reward yourself. Set aside the cigarette money you save and buy yourself something nice.  Look for support from others. Join a support group or smoking cessation program. Ask someone at home or at work to help you with your plan to quit smoking.  Always ask yourself, "Do I need this cigarette or is this just a reflex?" Tell yourself, "Today, I choose not to smoke," or "I do not want to smoke." You are reminding yourself of your decision to quit.  Do not replace cigarette smoking with electronic cigarettes (commonly called e-cigarettes). The safety of e-cigarettes is unknown, and some may contain harmful chemicals.  If you relapse, do not give up! Plan ahead and think about what you will do the next time you get the urge to smoke. HOW WILL I FEEL WHEN I QUIT SMOKING? You may have symptoms of withdrawal because your body is used to nicotine (the addictive substance in cigarettes). You may crave cigarettes, be irritable, feel very hungry, cough often, get headaches, or have difficulty concentrating. The withdrawal symptoms are only temporary. They are strongest when you first quit but will go away within 10-14 days. When withdrawal symptoms occur, stay in control. Think about your reasons for quitting. Remind yourself that these are signs that your body is healing and getting used to being without cigarettes. Remember that withdrawal symptoms are easier to treat than the major diseases that smoking can cause.  Even after the withdrawal is over, expect periodic urges to smoke. However,  these cravings are generally short lived and will go away whether you smoke or not. Do not smoke! WHAT RESOURCES ARE AVAILABLE TO HELP ME QUIT SMOKING? Your health care provider can direct you to community resources or hospitals for support, which may include:  Group support.  Education.  Hypnosis.  Therapy.   This information is not intended to replace advice given to you by your health care provider. Make sure you discuss any questions you have with your health care provider.   Document Released: 09/25/2003 Document Revised: 01/17/2014 Document Reviewed: 06/14/2012 Elsevier Interactive Patient Education [employer]

## 2015-10-05 NOTE — Progress Notes (Signed)
Patient ID: Keith Cobb, Keith Cobb    DOB: 07/26/1950, 65 y.o.   MRN: [id number]  PCP: No PCP Per Patient  Chief Complaint  Patient presents with  . Medication Refill    divalproex. PHQ9 score of 9    Subjective:   HPI 65 year old Keith Cobb presents for evaluation of bipolar disorder and medication refills of lisinopril and to discuss discontinuing Depakote. Patient reports a history of bipolar disorder with episodes of mania and depression. He reports that he has reduced his Depakote 2 months ago to one pill daily and would like to stop both his Cymbalta and Depakote as he feels he lacks energy. He denies any recent stress except he's been attempting to stop smoking.  He recently retired in April of this year and reports adjusting well to this new change.  He enjoys working outside in the yard and completing projects feels that his lack of energy is getting in the way of him being as active as he would like. Denies any thoughts of suicide or harm to others. Reports being spiritual and  involved in his church and feels that these Interaction have been most beneficial in improving his over all quality of life.  Social History   Social History  . Marital status: Unknown    Spouse name: N/A  . Number of children: N/A  . Years of education: N/A   Occupational History  . Not on file.   Social History Main Topics  . Smoking status: Current Some Day Smoker    Packs/day: 0.30    Years: 39.00    Types: Cigarettes  . Smokeless tobacco: Never Used  . Alcohol use 0.0 oz/week  . Drug use: No  . Sexual activity: Not on file   Other Topics Concern  . Not on file   Social History Narrative  . No narrative on file   Family History  Problem Relation Age of Onset  . Stroke Father    Review of Systems See HPI  Patient Active Problem List   Diagnosis Date Noted  . Smoker 05/04/2015  . HTN (hypertension) 03/19/2012  . Depression 03/19/2012    Prior to Admission medications   Medication  Sig Start Date End Date Taking? Authorizing Provider  divalproex (DEPAKOTE) 250 MG DR tablet TAKE 1 TABLET (250 MG TOTAL) BY MOUTH 3 (THREE) TIMES DAILY. 08/11/15  Yes Ofilia NeasMichael L Clark, PA-C  DULoxetine (CYMBALTA) 60 MG capsule Take 1 capsule (60 mg total) by mouth 2 (two) times daily. 08/12/15  Yes Ofilia NeasMichael L Clark, PA-C  lisinopril (PRINIVIL,ZESTRIL) 10 MG tablet TAKE ONE TABLET BY MOUTH ONCE DAILY FOR BLOOD PRESSURE 06/09/15  Yes Ofilia NeasMichael L Clark, PA-C  SUPER B COMPLEX/C PO Take by mouth.   Yes Historical Provider, MD  diclofenac (VOLTAREN) 75 MG EC tablet TAKE 1 TABLET (75 MG TOTAL) BY MOUTH 2 (TWO) TIMES DAILY. Patient not taking: Reported on 10/05/2015 09/05/15   Morrell RiddleSarah L Weber, PA-C  varenicline (CHANTIX CONTINUING MONTH PAK) 1 MG tablet Take 1 tablet (1 mg total) by mouth 2 (two) times daily. Patient not taking: Reported on 10/05/2015 05/04/15   Ofilia NeasMichael L Clark, PA-C  varenicline (CHANTIX STARTING MONTH PAK) 0.5 MG X 11 & 1 MG X 42 tablet Take one 0.5 mg tablet by mouth once daily for 3 days, then increase to one 0.5 mg tablet twice daily for 4 days, then increase to one 1 mg tablet twice daily. Patient not taking: Reported on 10/05/2015 05/04/15   Marolyn HammockMichael L  Clark, PA-C    Allergies  Allergen Reactions  . Bee Venom       Objective:  Physical Exam  Constitutional: He is oriented to person, place, and time. He appears well-developed and well-nourished.  HENT:  Head: Normocephalic and atraumatic.  Right Ear: External ear normal.  Left Ear: External ear normal.  Nose: Nose normal.  Mouth/Throat: Oropharynx is clear and moist.  Eyes: Conjunctivae and EOM are normal. Pupils are equal, round, and reactive to light.  Neck: Normal range of motion. Neck supple.  Cardiovascular: Normal rate, regular rhythm, normal heart sounds and intact distal pulses.   Pulmonary/Chest: Effort normal and breath sounds normal.  Musculoskeletal: Normal range of motion.  Neurological: He is alert and oriented to person,  place, and time.  Skin: Skin is warm and dry.  Psychiatric: He has a normal mood and affect. His behavior is normal. Judgment and thought content normal.    Vitals:   10/05/15 1500  BP: 112/60  Pulse: 88  Resp: 17  Temp: 97.4 F (36.3 C)   Depression screen [facility] 2/9 10/05/2015 05/04/2015 10/26/2014 07/31/2014 07/31/2014  Decreased Interest 3 0 0 0 0  Down, Depressed, Hopeless 2 0 0 1 0  PHQ - 2 Score 5 0 0 1 0  Altered sleeping 1 - - - -  Tired, decreased energy 2 - - - -  Change in appetite 0 - - - -  Feeling bad or failure about yourself  1 - - - -  Trouble concentrating 0 - - - -  Moving slowly or fidgety/restless 0 - - - -  Suicidal thoughts 0 - - - -  PHQ-9 Score 9 - - - -     Vitals:   10/05/15 1500  BP: 112/60  Pulse: 88  Resp: 17  Temp: 97.4 F (36.3 C)   Assessment & Plan:  1. Bipolar 1 disorder, mixed, moderate (HCC), Stable, symptoms controlled per patient.  Patient had started the process of weaning himself off of Depakote. He has been taking Depakote 250 mg tablet once daily for 2 months.  He feels as though this medication causes him increased fatigue and he would like to try and see how he functions without the medication. He agrees to continue Cymbalta.  Plan: Continue Cymbalta 60 mg, 2 times daily. Will discontinue divalproex 250 mg for now. Return in 4 weeks for re-evaluation.  We discussed if symptoms re-develop that we will restart Depakote. Patient refuses evaluation by psychiatry as he had a previously bad experience.   2. Essential hypertension, Controlled Plan: Continue: . lisinopril (PRINIVIL,ZESTRIL) 10 MG tablet    Sig: TAKE ONE TABLET BY MOUTH ONCE DAILY FOR BLOOD PRESSURE   Periodically check blood pressure at home and keep a log of readings.   Bring readings with you to your next blood pressure follow-up visit in 6 months.  Godfrey Pick. Tiburcio Pea, MSN, FNP-C [facility]

## 2015-10-30 ENCOUNTER — Encounter: Payer: Self-pay | Admitting: Family Medicine

## 2015-11-02 ENCOUNTER — Ambulatory Visit: Payer: BLUE CROSS/BLUE SHIELD

## 2015-11-06 ENCOUNTER — Ambulatory Visit (INDEPENDENT_AMBULATORY_CARE_PROVIDER_SITE_OTHER): Payer: BLUE CROSS/BLUE SHIELD | Admitting: Family Medicine

## 2015-11-06 VITALS — BP 130/100 | HR 87 | Temp 97.6°F | Resp 20 | Ht 68.5 in | Wt 205.4 lb

## 2015-11-06 DIAGNOSIS — I10 Essential (primary) hypertension: Secondary | ICD-10-CM | POA: Diagnosis not present

## 2015-11-06 DIAGNOSIS — M25561 Pain in right knee: Secondary | ICD-10-CM

## 2015-11-06 DIAGNOSIS — F319 Bipolar disorder, unspecified: Secondary | ICD-10-CM | POA: Diagnosis not present

## 2015-11-06 DIAGNOSIS — M25562 Pain in left knee: Secondary | ICD-10-CM

## 2015-11-06 DIAGNOSIS — Z23 Encounter for immunization: Secondary | ICD-10-CM

## 2015-11-06 MED ORDER — MELOXICAM 15 MG PO TABS: 15.0000 mg | ORAL_TABLET | Freq: Every day | ORAL | 1 refills | Status: DC

## 2015-11-06 MED ORDER — BUPROPION HCL ER (SR) 150 MG PO TB12: 150.0000 mg | ORAL_TABLET | Freq: Two times a day (BID) | ORAL | 1 refills | Status: DC

## 2015-11-06 NOTE — Progress Notes (Signed)
Patient ID: Keith Cobb, male    DOB: 03/24/1950, 65 y.o.   MRN: [id number]  PCP: No PCP Per Patient  Chief Complaint  Patient presents with  . Follow-up    meds.  . Flu Vaccine    Subjective:   HPI 65 year old male, presents for evaluation of depression and joint pain. [facility] and was last seen and evaluated on 10/05/15.  Chronic conditions include Bipolar 1 disorder and hypertension. During his last visit he discussed feeling that his Depakote was causing him significant lack of energy and he had independently reduced his dosage.  During that visit we agreed to discontinue his Depakote on trial basis and he was to return in one month to re-evaluate his treatment.   Depression He presents today with symptoms of depression and continued episodes of depression. Reports that his overall mood is not at the level he would like it to be.  He doesn't feel good about himself, although denies feelings or thoughts of self harm or doing harm to the others. Reports he is argumentative and feels as if the is always apologizing his behavior.  Reports he verbally abrasive and soon after realizes he is being unreasonable. Would like to try a different medication as he feels that the Depakote caused increased fatigue.  Hypertension Blood pressure has been very well controlled.He is taking medication and denies headaches, chest pain, or shortness of breath.He's been occasionally checking blood pressure with readings in 130's systolic and 80's diastolic.  Joint Pain  Still having knee and low back pain with doing activites such as yard work and maintenance duties around the house. Characterizes pain as "aching and stiffness". Takes ibuprofen and tylenol with minimal relief of pain symptoms.     Review of Systems See HPI  Patient Active Problem List   Diagnosis Date Noted  . Smoker 05/04/2015  . HTN (hypertension) 03/19/2012  . Depression 03/19/2012     Prior to Admission  medications   Medication Sig Start Date End Date Taking? Authorizing Provider  DULoxetine (CYMBALTA) 60 MG capsule Take 1 capsule (60 mg total) by mouth 2 (two) times daily. 08/12/15  Yes Ofilia NeasMichael L Clark, PA-C  lisinopril (PRINIVIL,ZESTRIL) 10 MG tablet TAKE ONE TABLET BY MOUTH ONCE DAILY FOR BLOOD PRESSURE 10/05/15  Yes Doyle AskewKimberly Stephenia Kaia Depaolis, FNP  Multiple Vitamins-Minerals (MULTIVITAMIN WITH MINERALS) tablet Take 1 tablet by mouth daily. 10/05/15  Yes Doyle AskewKimberly Stephenia Claudett Bayly, FNP  SUPER B COMPLEX/C PO Take by mouth.   Yes Historical Provider, MD    Allergies  Allergen Reactions  . Bee Venom       Objective:  Physical Exam  Constitutional: He is oriented to person, place, and time. He appears well-developed and well-nourished.  HENT:  Head: Normocephalic and atraumatic.  Right Ear: External ear normal.  Left Ear: External ear normal.  Nose: Nose normal.  Mouth/Throat: Oropharynx is clear and moist.  Eyes: Conjunctivae and EOM are normal. Pupils are equal, round, and reactive to light.  Neck: Normal range of motion. Neck supple.  Cardiovascular: Normal rate, regular rhythm, normal heart sounds and intact distal pulses.   Pulmonary/Chest: Effort normal and breath sounds normal.  Musculoskeletal: Normal range of motion.  Neurological: He is alert and oriented to person, place, and time. He has normal reflexes.  Skin: Skin is warm and dry.  Psychiatric: Judgment normal. His mood appears anxious. His speech is rapid and/or pressured. He is hyperactive. Thought content is not paranoid and not delusional.  Cognition and memory are normal. He expresses no homicidal and no suicidal ideation. He expresses no suicidal plans and no homicidal plans.  Rapid speech, jumping from subject to subject. Have to refocus patient constantly during visit.      Vitals:   11/06/15 1430  BP: (!) 130/100  Pulse: 87  Resp: 20  Temp: 97.6 F (36.4 C)   Assessment & Plan:  1. Essential hypertension,  Controlled  -Continue lisinopril 10 mg tablet, once daily   2. Bipolar 1 disorder (HCC) -Continue Duloxetine (Cymbalta) 60 mg 2 times daily -Start Bupropion (Wellbutrin) 150 mg 2 times daily    3. Arthralgia of both knees -Start Meloxicam (Mobic) 15 mg tablet daily   4. Need for influenza vaccination - Flu Vaccine QUAD 36+ mos IM  Follow-up 6 week for depression.  Godfrey Pick. Tiburcio Pea, MSN, FNP-C [facility]

## 2015-11-06 NOTE — Patient Instructions (Addendum)
  Start Wellbutrin 150 mg twice daily for depression Continue Cymbalta. Start Meloxicam 15 mg once daily for joint pain. Do not take Advil and naproxen while taking this medication. Follow-up 6 week for depression.  Keith PickKimberly S. Tiburcio PeaHarris, MSN, FNP-C [facility]   IF you received an x-ray today, you will receive an invoice from [facility]. Please contact [facility] at [phone] with questions or concerns regarding your invoice.   IF you received labwork today, you will receive an invoice from [facility]. Please contact [facility] at [phone] with questions or concerns regarding your invoice.   Our billing staff will not be able to assist you with questions regarding bills from these companies.  You will be contacted with the lab results as soon as they are available. The fastest way to get your results is to activate your My Chart account. Instructions are located on the last page of this paperwork. If you have not heard from us regarding the results in 2 weeks, please contact this office.

## 2016-01-05 ENCOUNTER — Encounter: Payer: Self-pay | Admitting: Family Medicine

## 2016-01-05 MED ORDER — MELOXICAM 15 MG PO TABS: 15.0000 mg | ORAL_TABLET | Freq: Every day | ORAL | 0 refills | Status: DC

## 2016-01-06 ENCOUNTER — Other Ambulatory Visit: Payer: Self-pay | Admitting: Family Medicine

## 2016-01-30 ENCOUNTER — Other Ambulatory Visit: Payer: Self-pay | Admitting: Family Medicine

## 2016-02-05 ENCOUNTER — Other Ambulatory Visit: Payer: Self-pay | Admitting: Family Medicine

## 2016-02-05 NOTE — Telephone Encounter (Signed)
Meds ordered this encounter  Medications  . meloxicam (MOBIC) 15 MG tablet    Sig: TAKE 1 TABLET BY MOUTH ONCE DAILY    Dispense:  30 tablet    Refill:  0

## 2016-03-04 ENCOUNTER — Other Ambulatory Visit: Payer: Self-pay | Admitting: Physician Assistant

## 2016-04-02 ENCOUNTER — Other Ambulatory Visit: Payer: Self-pay | Admitting: Family Medicine

## 2016-04-06 ENCOUNTER — Other Ambulatory Visit: Payer: Self-pay | Admitting: Urgent Care

## 2016-04-07 ENCOUNTER — Other Ambulatory Visit: Payer: Self-pay | Admitting: Urgent Care

## 2016-04-07 NOTE — Telephone Encounter (Signed)
Patient needs f/u appointment. Left voice message for patient to make an appointment.

## 2016-04-08 ENCOUNTER — Other Ambulatory Visit: Payer: Self-pay | Admitting: Urgent Care

## 2016-04-08 MED ORDER — MELOXICAM 15 MG PO TABS: 15.0000 mg | ORAL_TABLET | Freq: Every day | ORAL | 0 refills | Status: DC

## 2016-04-08 NOTE — Telephone Encounter (Signed)
03/04/16 last refill 10/2015 last ov with harris

## 2016-04-22 ENCOUNTER — Other Ambulatory Visit: Payer: Self-pay | Admitting: Physician Assistant

## 2016-05-02 ENCOUNTER — Other Ambulatory Visit: Payer: Self-pay | Admitting: Physician Assistant

## 2016-05-02 ENCOUNTER — Encounter: Payer: Self-pay | Admitting: Physician Assistant

## 2016-05-02 MED ORDER — MELOXICAM 15 MG PO TABS: 15.0000 mg | ORAL_TABLET | Freq: Every day | ORAL | 0 refills | Status: DC

## 2016-05-09 ENCOUNTER — Ambulatory Visit (INDEPENDENT_AMBULATORY_CARE_PROVIDER_SITE_OTHER): Payer: BLUE CROSS/BLUE SHIELD | Admitting: Physician Assistant

## 2016-05-09 ENCOUNTER — Encounter: Payer: Self-pay | Admitting: Physician Assistant

## 2016-05-09 ENCOUNTER — Other Ambulatory Visit: Payer: Self-pay | Admitting: Physician Assistant

## 2016-05-09 VITALS — BP 132/84 | HR 78 | Temp 98.1°F | Resp 16 | Ht 68.0 in | Wt 211.0 lb

## 2016-05-09 DIAGNOSIS — Z122 Encounter for screening for malignant neoplasm of respiratory organs: Secondary | ICD-10-CM

## 2016-05-09 DIAGNOSIS — Z131 Encounter for screening for diabetes mellitus: Secondary | ICD-10-CM | POA: Diagnosis not present

## 2016-05-09 DIAGNOSIS — Z23 Encounter for immunization: Secondary | ICD-10-CM

## 2016-05-09 DIAGNOSIS — F3162 Bipolar disorder, current episode mixed, moderate: Secondary | ICD-10-CM

## 2016-05-09 DIAGNOSIS — Z136 Encounter for screening for cardiovascular disorders: Secondary | ICD-10-CM

## 2016-05-09 DIAGNOSIS — Z1389 Encounter for screening for other disorder: Secondary | ICD-10-CM

## 2016-05-09 DIAGNOSIS — Z1329 Encounter for screening for other suspected endocrine disorder: Secondary | ICD-10-CM | POA: Diagnosis not present

## 2016-05-09 DIAGNOSIS — Z0000 Encounter for general adult medical examination without abnormal findings: Secondary | ICD-10-CM | POA: Diagnosis not present

## 2016-05-09 DIAGNOSIS — Z13220 Encounter for screening for lipoid disorders: Secondary | ICD-10-CM

## 2016-05-09 DIAGNOSIS — Z130 Encounter for screening for diseases of the blood and blood-forming organs and certain disorders involving the immune mechanism: Secondary | ICD-10-CM

## 2016-05-09 DIAGNOSIS — Z1211 Encounter for screening for malignant neoplasm of colon: Secondary | ICD-10-CM | POA: Diagnosis not present

## 2016-05-09 MED ORDER — DULOXETINE HCL 60 MG PO CPEP: 60.0000 mg | ORAL_CAPSULE | Freq: Two times a day (BID) | ORAL | 3 refills | Status: DC

## 2016-05-09 MED ORDER — MELOXICAM 15 MG PO TABS: 7.5000 mg | ORAL_TABLET | Freq: Every day | ORAL | 0 refills | Status: DC

## 2016-05-09 MED ORDER — BUPROPION HCL ER (SR) 150 MG PO TB12: 150.0000 mg | ORAL_TABLET | Freq: Two times a day (BID) | ORAL | 3 refills | Status: DC

## 2016-05-09 MED ORDER — LISINOPRIL 10 MG PO TABS: ORAL_TABLET | ORAL | 3 refills | Status: DC

## 2016-05-09 NOTE — Patient Instructions (Signed)
     IF you received an x-ray today, you will receive an invoice from [facility]. Please contact [facility] at [phone] with questions or concerns regarding your invoice.   IF you received labwork today, you will receive an invoice from [facility]. Please contact [facility] at [phone] with questions or concerns regarding your invoice.   Our billing staff will not be able to assist you with questions regarding bills from these companies.  You will be contacted with the lab results as soon as they are available. The fastest way to get your results is to activate your My Chart account. Instructions are located on the last page of this paperwork. If you have not heard from us regarding the results in 2 weeks, please contact this office.     

## 2016-05-09 NOTE — Progress Notes (Signed)
05/09/2016 2:09 PM   DOB: 11-12-1950 / MRN: [id number]  SUBJECTIVE:  Keith Cobb is a 66 y.o. male presenting for an annual physical and has no complaints today.  With regard to exercise, he tells me he exercises about 3 times daily and this includes dog walking, lawn mowing and house work. Does have some depression and is being seen by me for this. Denies suicidal thoughts at this time. Tells me he is down to 6-7 cigarettes daily. Drinks about 1 case of Bud Light weekly. Denies guilt regarding his drinking, no eye openers, and does not get defensive about his drinking.   He is a current smoker and has been smoking.  He is behind on his colonoscopy and would like a referral today.  Denies ever receiving any lung cancer or aneurysm screenings.   Immunization History  Administered Date(s) Administered  . Influenza,inj,Quad PF,36+ Mos 10/30/2012, 12/31/2014, 11/06/2015  . Tdap 05/04/2015    Health Maintenance Due  Topic  . COLONOSCOPY   . PNA vac Low Risk Adult (1 of 2 - PCV13)     He is allergic to bee venom.   He  has a past medical history of Anxiety; Arthritis; Depression; and Hypertension.    He  reports that he has been smoking Cigarettes.  He has a 11.70 pack-year smoking history. He has never used smokeless tobacco. He reports that he drinks alcohol. He reports that he does not use drugs. He  has no sexual activity history on file. The patient  has no past surgical history on file.  His family history includes Stroke in his father.  Review of Systems  Constitutional: Negative for chills, diaphoresis and fever.  Eyes: Negative.   Respiratory: Negative for cough, hemoptysis, sputum production, shortness of breath and wheezing.   Cardiovascular: Negative for chest pain, orthopnea and leg swelling.  Gastrointestinal: Negative for abdominal pain, blood in stool, constipation, diarrhea, heartburn, melena, nausea and vomiting.  Genitourinary: Negative for flank pain.  Skin:  Negative for rash.  Neurological: Negative for dizziness, sensory change, speech change, focal weakness and headaches.    Problem list and medications reviewed and updated by myself where necessary, and exist elsewhere in the encounter.   OBJECTIVE:  BP 132/84   Pulse 78   Temp 98.1 F (36.7 C) (Oral)   Resp 16   Ht _0  (1.727 m)   Wt 211 lb (95.7 kg)   SpO2 97%   BMI 32.08 kg/m   Physical Exam  Constitutional: He is oriented to person, place, and time. He appears well-developed. He is active and cooperative.  Non-toxic appearance.  Eyes: EOM are normal. Pupils are equal, round, and reactive to light.  Cardiovascular: Normal rate, regular rhythm, S1 normal, S2 normal, normal heart sounds, intact distal pulses and normal pulses.  Exam reveals no gallop and no friction rub.   No murmur heard. Pulmonary/Chest: Effort normal. No stridor. No tachypnea. No respiratory distress. He has no wheezes. He has no rales.  Abdominal: He exhibits no distension.  Musculoskeletal: He exhibits no edema.  Neurological: He is alert and oriented to person, place, and time. He has normal strength. He is not disoriented. No cranial nerve deficit or sensory deficit. He exhibits normal muscle tone. Coordination and gait normal.  Skin: Skin is warm and dry. He is not diaphoretic. No pallor.  Psychiatric: His behavior is normal.  Vitals reviewed.   Lab Results  Component Value Date   WBC 5.4 05/04/2015   HGB 16.3  05/04/2015   HCT 48.1 05/04/2015   MCV 90.8 05/04/2015   PLT 169 05/04/2015     ASSESSMENT AND PLAN  Glendell Docker III was seen today for annual exam and medication refill.  Diagnoses and all orders for this visit:  Annual physical exam: He is doing reasonably well for a male who has smoked and drank moderate amounts of alcohol.  I have counseled him today and in the past about the dangers of excessive consumption.  He understands the risk. Will go ahead and refill his chronic medications  today as well, as he is good with contacting me if he has any problems.   Special screening for malignant neoplasms, colon -     Ambulatory referral to Gastroenterology  Screening for deficiency anemia -     CBC  Screening for nephropathy -     CMP14+EGFR  Screening for lipid disorders -     Lipid panel  Screening for thyroid disorder -     TSH  Screening for diabetes mellitus -     Hemoglobin A1c  Screening for AAA (abdominal aortic aneurysm) -     US ABDOMINAL AORTA SCREENING AAA; Future  Encounter for screening for lung cancer -     CT Chest Wo Contrast; Future  Need for vaccination against Streptococcus pneumoniae -     Pneumococcal conjugate vaccine 13-valent IM    The patient was advised to call or return to clinic if he does not see an improvement in symptoms or to seek the care of the closest emergency department if he worsens with the above plan.   Philis Fendt, MHS, PA-C [facility] 05/09/2016 2:09 PM

## 2016-05-10 LAB — CMP14+EGFR
ALK PHOS: 86 IU/L (ref 39–117)
ALT: 19 IU/L (ref 0–44)
AST: 15 IU/L (ref 0–40)
Albumin/Globulin Ratio: 2 (ref 1.2–2.2)
Albumin: 4.5 g/dL (ref 3.6–4.8)
BUN/Creatinine Ratio: 21 (ref 10–24)
BUN: 16 mg/dL (ref 8–27)
Bilirubin Total: 0.7 mg/dL (ref 0.0–1.2)
CALCIUM: 9.4 mg/dL (ref 8.6–10.2)
CO2: 24 mmol/L (ref 18–29)
CREATININE: 0.78 mg/dL (ref 0.76–1.27)
Chloride: 100 mmol/L (ref 96–106)
GFR calc Af Amer: 110 mL/min/{1.73_m2} (ref >59)
GFR, EST NON AFRICAN AMERICAN: 95 mL/min/{1.73_m2} (ref >59)
GLOBULIN, TOTAL: 2.2 g/dL (ref 1.5–4.5)
Glucose: 96 mg/dL (ref 65–99)
Potassium: 4.4 mmol/L (ref 3.5–5.2)
SODIUM: 140 mmol/L (ref 134–144)
Total Protein: 6.7 g/dL (ref 6.0–8.5)

## 2016-05-10 LAB — CBC
HEMATOCRIT: 47.5 % (ref 37.5–51.0)
HEMOGLOBIN: 16 g/dL (ref 13.0–17.7)
MCH: 30.9 pg (ref 26.6–33.0)
MCHC: 33.7 g/dL (ref 31.5–35.7)
MCV: 92 fL (ref 79–97)
Platelets: 190 10*3/uL (ref 150–379)
RBC: 5.17 x10E6/uL (ref 4.14–5.80)
RDW: 13.3 % (ref 12.3–15.4)
WBC: 6.2 10*3/uL (ref 3.4–10.8)

## 2016-05-10 LAB — HEMOGLOBIN A1C
ESTIMATED AVERAGE GLUCOSE: 103 mg/dL
Hgb A1c MFr Bld: 5.2 % (ref 4.8–5.6)

## 2016-05-10 LAB — LIPID PANEL
CHOL/HDL RATIO: 3.3 ratio (ref 0.0–5.0)
CHOLESTEROL TOTAL: 173 mg/dL (ref 100–199)
HDL: 53 mg/dL (ref >39)
LDL CALC: 87 mg/dL (ref 0–99)
TRIGLYCERIDES: 164 mg/dL — AB (ref 0–149)
VLDL CHOLESTEROL CAL: 33 mg/dL (ref 5–40)

## 2016-05-10 LAB — TSH: TSH: 1.49 u[IU]/mL (ref 0.450–4.500)

## 2016-05-13 ENCOUNTER — Encounter: Payer: Self-pay | Admitting: Gastroenterology

## 2016-05-14 ENCOUNTER — Encounter: Payer: Self-pay | Admitting: Physician Assistant

## 2016-05-16 ENCOUNTER — Encounter: Payer: Self-pay | Admitting: Physician Assistant

## 2016-05-16 ENCOUNTER — Ambulatory Visit (INDEPENDENT_AMBULATORY_CARE_PROVIDER_SITE_OTHER): Payer: Commercial Managed Care - PPO | Admitting: Physician Assistant

## 2016-05-16 VITALS — BP 122/81 | HR 90 | Temp 98.3°F | Resp 16 | Ht 68.0 in | Wt 211.0 lb

## 2016-05-16 DIAGNOSIS — G8929 Other chronic pain: Secondary | ICD-10-CM

## 2016-05-16 DIAGNOSIS — M25512 Pain in left shoulder: Secondary | ICD-10-CM | POA: Diagnosis not present

## 2016-05-16 DIAGNOSIS — M19012 Primary osteoarthritis, left shoulder: Secondary | ICD-10-CM | POA: Diagnosis not present

## 2016-05-16 DIAGNOSIS — M1712 Unilateral primary osteoarthritis, left knee: Secondary | ICD-10-CM | POA: Diagnosis not present

## 2016-05-16 NOTE — Patient Instructions (Addendum)
Okay to take 1000mg  of Tylenol every 8 hours as needed for pain. You can also take meloxicam with the Tylenol. Do not take any other pain medication.    IF you received an x-ray today, you will receive an invoice from [facility]. Please contact [facility] at [phone] with questions or concerns regarding your invoice.   IF you received labwork today, you will receive an invoice from [facility]. Please contact [facility] at [phone] with questions or concerns regarding your invoice.   Our billing staff will not be able to assist you with questions regarding bills from these companies.  You will be contacted with the lab results as soon as they are available. The fastest way to get your results is to activate your My Chart account. Instructions are located on the last page of this paperwork. If you have not heard from us regarding the results in 2 weeks, please contact this office.

## 2016-05-16 NOTE — Progress Notes (Signed)
  05/16/2016 3:32 PM   DOB: 08/14/1950 / MRN: [id number]  SUBJECTIVE:  Keith Cobb is a 66 y.o. male presenting for joint pain. Tells me both shoulders are painful.  Tells me that he was trying to stretch his arms via abduction yesterday and winced due to pain in his left shoulder. Has previous rads showing OA in the left shoulder.   He does take Meloxicam 15 mg daily on most days of the week.  He did have an arthroscopy on the right shoulder.    He also complains of some right knee pain from time to time.  He has a previous rad should mild DJD of the joint.  It is not bothering him today but he felt it was worth discussing.  It tends to bother him for no more than two days at a time.   He is allergic to bee venom.   He  has a past medical history of Anxiety; Arthritis; Depression; and Hypertension.    He  reports that he has been smoking Cigarettes.  He has a 11.70 pack-year smoking history. He has never used smokeless tobacco. He reports that he drinks alcohol. He reports that he does not use drugs. He  has no sexual activity history on file. The patient  has no past surgical history on file.  His family history includes Stroke in his father.  Review of Systems  Constitutional: Negative for chills and fever.  Musculoskeletal: Positive for joint pain and myalgias. Negative for back pain, falls and neck pain.  Skin: Negative for itching and rash.  Neurological: Negative for dizziness.    The problem list and medications were reviewed and updated by myself where necessary and exist elsewhere in the encounter.   OBJECTIVE:  BP 122/81   Pulse 90   Temp 98.3 F (36.8 C) (Oral)   Resp 16   Ht 5\' 8"  (1.727 m)   Wt 211 lb (95.7 kg)   SpO2 99%   BMI 32.08 kg/m   Physical Exam  Cardiovascular: Normal rate, regular rhythm and normal heart sounds.   Pulmonary/Chest: Effort normal and breath sounds normal.  Musculoskeletal: Normal range of motion.   Procedure:  Left shoulder prepped  with betadine.  Sterile prep and drape.  Shoulder injected with 1:1 mix of kenalog and marcaine into the subacromial space 2 cm inferior and 2cm medial the posterolateral corner of the acromion. Patient tolerated the procedure without complaint.  Patient pain free through full range of motion post procedure.   No results found for this or any previous visit (from the past 72 hour(s)).  No results found.  ASSESSMENT AND PLAN:  Keith Cobb was seen today for follow-up.  Diagnoses and all orders for this visit:  Chronic left shoulder pain: Steroid injection today. He tolerated this well.  Will see him back for the left in about 1 month.   Primary osteoarthritis of left shoulder: See problem one.  He does not want to see an orthopod.   Primary osteoarthritis of left knee: Doing well right now.  Advised we can revisit this in a time that he is having problems.     The patient is advised to call or return to clinic if he does not see an improvement in symptoms, or to seek the care of the closest emergency department if he worsens with the above plan.   Deliah BostonMichael Clark, MHS, PA-C [facility] 05/16/2016 3:32 PM

## 2016-05-20 ENCOUNTER — Other Ambulatory Visit: Payer: Self-pay | Admitting: Physician Assistant

## 2016-05-20 ENCOUNTER — Ambulatory Visit
Admission: RE | Admit: 2016-05-20 | Discharge: 2016-05-20 | Disposition: A | Discharge status: Home / Self Care | Payer: Commercial Managed Care - PPO | Source: Ambulatory Visit | Attending: Physician Assistant | Admitting: Physician Assistant

## 2016-05-20 DIAGNOSIS — Z136 Encounter for screening for cardiovascular disorders: Secondary | ICD-10-CM

## 2016-05-20 DIAGNOSIS — Z122 Encounter for screening for malignant neoplasm of respiratory organs: Secondary | ICD-10-CM

## 2016-05-23 ENCOUNTER — Encounter: Payer: Self-pay | Admitting: Physician Assistant

## 2016-06-20 ENCOUNTER — Ambulatory Visit (INDEPENDENT_AMBULATORY_CARE_PROVIDER_SITE_OTHER): Payer: Commercial Managed Care - PPO | Admitting: Physician Assistant

## 2016-06-20 ENCOUNTER — Encounter: Payer: Self-pay | Admitting: Physician Assistant

## 2016-06-20 VITALS — BP 124/78 | HR 84 | Temp 97.2°F | Resp 18 | Ht 68.0 in | Wt 213.0 lb

## 2016-06-20 DIAGNOSIS — G8929 Other chronic pain: Secondary | ICD-10-CM

## 2016-06-20 DIAGNOSIS — M25511 Pain in right shoulder: Secondary | ICD-10-CM | POA: Diagnosis not present

## 2016-06-20 DIAGNOSIS — F430 Acute stress reaction: Secondary | ICD-10-CM

## 2016-06-20 MED ORDER — HYDROXYZINE HCL 50 MG PO TABS: ORAL_TABLET | ORAL | 0 refills | Status: DC

## 2016-06-20 NOTE — Progress Notes (Signed)
06/20/2016 4:33 PM   DOB: 1950/10/11 / MRN: [id number]  SUBJECTIVE:  Keith Cobb is a 66 y.o. male presenting for a right sided shoulder injection.  He has a history of bilateral shoulder arthritis.  Last time he was here I injected his left shoulder and today he tells me that shoulder felt better for about two days.  He does not want another injection today.   He is worried about his BP and is having some stress at home.  His mother in-law has moved into the house with him, again, after initially being placed in a home.  He wants to take care of her but feels that she is impeding his twilight years with he and his wife.  Tells me that he argues with her on a daily basis and the arguments evolve around the care of his in law. He denies suicidally today, but expresses profound guilt for wanting to be selfish with his time in retirement.   Depression screen PHQ 2/9 06/20/2016  Decreased Interest 1  Down, Depressed, Hopeless 1  PHQ - 2 Score 2  Altered sleeping 1  Tired, decreased energy 0  Change in appetite 0  Feeling bad or failure about yourself  2  Trouble concentrating 0  Moving slowly or fidgety/restless 0  Suicidal thoughts 0  PHQ-9 Score 5  Difficult doing work/chores Somewhat difficult     He is allergic to bee venom.   He  has a past medical history of Anxiety; Arthritis; Depression; and Hypertension.    He  reports that he has been smoking Cigarettes.  He has a 11.70 pack-year smoking history. He has never used smokeless tobacco. He reports that he drinks alcohol. He reports that he does not use drugs. He  has no sexual activity history on file. The patient  has no past surgical history on file.  His family history includes Stroke in his father.  Review of Systems  Constitutional: Negative for chills, diaphoresis and fever.  HENT: Negative for sore throat.   Gastrointestinal: Negative for nausea.  Skin: Negative for rash.  Neurological: Negative for dizziness.     The problem list and medications were reviewed and updated by myself where necessary and exist elsewhere in the encounter.   OBJECTIVE:  BP 124/78   Pulse 84   Temp 97.2 F (36.2 C) (Oral)   Resp 18   Ht 5\' 8"  (1.727 m)   Wt 213 lb (96.6 kg)   SpO2 98%   BMI 32.39 kg/m   Physical Exam  Constitutional: He appears well-developed. He is active and cooperative.  Non-toxic appearance.  Cardiovascular: Normal rate.   Pulmonary/Chest: Effort normal. No tachypnea.  Musculoskeletal: He exhibits tenderness (Bony, right shoulder, decreased ROM 2/2 to pain. Crepitus ausculated about the joint. ). He exhibits no edema or deformity.  Neurological: He is alert.  Skin: Skin is warm and dry. He is not diaphoretic. No pallor.  Psychiatric: His behavior is normal. Judgment and thought content normal. His mood appears not anxious. His affect is angry. His affect is not blunt, not labile and not inappropriate. His speech is not rapid and/or pressured, not delayed, not tangential and not slurred. Cognition and memory are normal. He does not exhibit a depressed mood. He is communicative.  Vitals reviewed.    ASSESSMENT AND PLAN:  Keith Cobb was seen today for shoulder pain and follow-up.  Diagnoses and all orders for this visit:  Chronic right shoulder pain: He does not want a  referral today. I will see him back in two weeks to recheck this problem and the below problem.   Stress reaction: He does not want to try psych or counseling.  He is will to try hydrox as needed for particularly stressful times or if he is having difficulty sleeping.  -     hydrOXYzine (ATARAX/VISTARIL) 50 MG tablet; Take 1/2 to one tab as needed for stress.  Okay to take before bed if needed.    The patient is advised to call or return to clinic if he does not see an improvement in symptoms, or to seek the care of the closest emergency department if he worsens with the above plan.   Deliah BostonMichael Tuan Tippin, MHS, PA-C Primary  Care at [facility] 06/20/2016 4:33 PM

## 2016-06-20 NOTE — Patient Instructions (Signed)
     IF you received an x-ray today, you will receive an invoice from [facility]. Please contact [facility] at [phone] with questions or concerns regarding your invoice.   IF you received labwork today, you will receive an invoice from [facility]. Please contact [facility] at [phone] with questions or concerns regarding your invoice.   Our billing staff will not be able to assist you with questions regarding bills from these companies.  You will be contacted with the lab results as soon as they are available. The fastest way to get your results is to activate your My Chart account. Instructions are located on the last page of this paperwork. If you have not heard from us regarding the results in 2 weeks, please contact this office.     

## 2016-07-04 ENCOUNTER — Ambulatory Visit (INDEPENDENT_AMBULATORY_CARE_PROVIDER_SITE_OTHER): Payer: Commercial Managed Care - PPO | Admitting: Physician Assistant

## 2016-07-04 ENCOUNTER — Encounter: Payer: Self-pay | Admitting: Physician Assistant

## 2016-07-04 VITALS — BP 108/74 | HR 100 | Temp 98.1°F | Resp 18 | Ht 68.66 in | Wt 212.6 lb

## 2016-07-04 DIAGNOSIS — F430 Acute stress reaction: Secondary | ICD-10-CM | POA: Diagnosis not present

## 2016-07-04 NOTE — Patient Instructions (Signed)
     IF you received an x-ray today, you will receive an invoice from [facility]. Please contact [facility] at [phone] with questions or concerns regarding your invoice.   IF you received labwork today, you will receive an invoice from [facility]. Please contact [facility] at [phone] with questions or concerns regarding your invoice.   Our billing staff will not be able to assist you with questions regarding bills from these companies.  You will be contacted with the lab results as soon as they are available. The fastest way to get your results is to activate your My Chart account. Instructions are located on the last page of this paperwork. If you have not heard from us regarding the results in 2 weeks, please contact this office.     

## 2016-07-04 NOTE — Progress Notes (Signed)
    07/04/2016 1:50 PM   DOB: 03/04/1950 / MRN: [id number]  SUBJECTIVE:  Keith Cobb is a 66 y.o. male presenting for recheck of stress at home.  See my last note for details. He feels well today and denies complaint.  He was able to talk with his wife and they have made some compromising with regard to his mother in-law.  He feels that he does not need me today for any reason.   [facility] 07/04/2016  Decreased Interest 0  Down, Depressed, Hopeless 0  PHQ - 2 Score 0  Altered sleeping -  Tired, decreased energy -  Change in appetite -  Feeling bad or failure about yourself  -  Trouble concentrating -  Moving slowly or fidgety/restless -  Suicidal thoughts -  PHQ-9 Score -  Difficult doing work/chores -     He is allergic to bee venom.   He  has a past medical history of Anxiety; Arthritis; Depression; and Hypertension.    He  reports that he has been smoking Cigarettes.  He has a 9.75 pack-year smoking history. He has never used smokeless tobacco. He reports that he drinks about 7.2 oz of alcohol per week . He reports that he does not use drugs. He  has no sexual activity history on file. The patient  has no past surgical history on file.  His family history includes Stroke in his father.  Review of Systems  Psychiatric/Behavioral: Negative for depression and suicidal ideas. The patient is not nervous/anxious and does not have insomnia.     The problem list and medications were reviewed and updated by myself where necessary and exist elsewhere in the encounter.   OBJECTIVE:  BP 108/74 (BP Location: Right Arm, Patient Position: Sitting, Cuff Size: Large)   Pulse 100   Temp 98.1 F (36.7 C) (Oral)   Resp 18   Ht 5' 8.66" (1.744 m)   Wt 212 lb 9.6 oz (96.4 kg)   SpO2 95%   BMI 31.71 kg/m   Wt Readings from Last 3 Encounters:  07/04/16 212 lb 9.6 oz (96.4 kg)  06/20/16 213 lb (96.6 kg)  05/16/16 211 lb (95.7 kg)     Physical Exam  Constitutional:  He appears well-developed. He is active and cooperative.  Non-toxic appearance.  Cardiovascular: Normal rate.   Pulmonary/Chest: Effort normal. No tachypnea.  Neurological: He is alert.  Skin: Skin is warm and dry. He is not diaphoretic. No pallor.  Psychiatric: He has a normal mood and affect. His behavior is normal. Judgment and thought content normal.  Vitals reviewed.   No results found for this or any previous visit (from the past 72 hour(s)).  No results found.  ASSESSMENT AND PLAN:  Keith Cobb was seen today for shoulder pain.  Diagnoses and all orders for this visit:  Stress reaction: Keith Cobb is a resilient  person. I think last week he may have simply needed someone to talk to and someone to validate his feelings.  He feels better today and denies complaint.     The patient is advised to call or return to clinic if he does not see an improvement in symptoms, or to seek the care of the closest emergency department if he worsens with the above plan.   Deliah BostonMichael Dagan Heinz, MHS, PA-C Primary Care at [facility] 07/04/2016 1:50 PM

## 2016-07-11 ENCOUNTER — Ambulatory Visit (INDEPENDENT_AMBULATORY_CARE_PROVIDER_SITE_OTHER): Payer: Commercial Managed Care - PPO | Admitting: Physician Assistant

## 2016-07-11 ENCOUNTER — Encounter: Payer: Self-pay | Admitting: Physician Assistant

## 2016-07-11 VITALS — BP 129/88 | HR 95 | Resp 16 | Ht 68.66 in | Wt 210.8 lb

## 2016-07-11 DIAGNOSIS — L03115 Cellulitis of right lower limb: Secondary | ICD-10-CM

## 2016-07-11 MED ORDER — CEPHALEXIN 750 MG PO CAPS: 750.0000 mg | ORAL_CAPSULE | Freq: Two times a day (BID) | ORAL | 0 refills | Status: DC

## 2016-07-11 NOTE — Progress Notes (Signed)
    07/12/2016 9:15 AM   DOB: 09/28/1950 / MRN: [id number]  SUBJECTIVE:  Keith Cobb is a 66 y.o. male presenting for leg pain after hitting his leg into some bricks two days ago. Tells me the pain about the right lower medial shin is worsening. Complains of exquisite tenderness about that area.  He also fell backwards into his storm door.   Immunization History  Administered Date(s) Administered  . Influenza,inj,Quad PF,36+ Mos 10/30/2012, 12/31/2014, 11/06/2015  . Pneumococcal Conjugate-13 05/09/2016  . Tdap 05/04/2015    He is allergic to bee venom.   He  has a past medical history of Anxiety; Arthritis; Depression; and Hypertension.    He  reports that he has been smoking Cigarettes.  He has a 9.75 pack-year smoking history. He has never used smokeless tobacco. He reports that he drinks about 7.2 oz of alcohol per week . He reports that he does not use drugs. He  has no sexual activity history on file. The patient  has no past surgical history on file.  His family history includes Stroke in his father.  Review of Systems  Constitutional: Negative for chills, diaphoresis and fever.  Respiratory: Negative for cough, hemoptysis, sputum production, shortness of breath and wheezing.   Cardiovascular: Negative for chest pain, orthopnea and leg swelling.  Gastrointestinal: Negative for nausea.  Skin: Negative for rash.  Neurological: Negative for dizziness.    The problem list and medications were reviewed and updated by myself where necessary and exist elsewhere in the encounter.   OBJECTIVE:  BP 129/88 (BP Location: Right Arm, Patient Position: Sitting, Cuff Size: Large)   Pulse 95   Resp 16   Ht 5' 8.66" (1.744 m)   Wt 210 lb 12.8 oz (95.6 kg)   SpO2 97%   BMI 31.44 kg/m   Physical Exam  Constitutional: He appears well-developed. He is active and cooperative.  Non-toxic appearance.  Cardiovascular: Normal rate.   Pulmonary/Chest: Effort normal. No tachypnea.    Neurological: He is alert.  Skin: Skin is warm and dry. Rash (puncture injury about the right lower leg with early exquisite tenderness) noted. He is not diaphoretic. No pallor.  Vitals reviewed.      No results found for this or any previous visit (from the past 72 hour(s)).  No results found.  ASSESSMENT AND PLAN:  Keith Cobb was seen today for ankle injury.  Diagnoses and all orders for this visit:  Cellulitis of leg, right Comments: Early. Will start Keflex.  TDAP is current and documented.  Orders: -     cephALEXin (KEFLEX) 750 MG capsule; Take 1 capsule (750 mg total) by mouth 2 (two) times daily.    The patient is advised to call or return to clinic if he does not see an improvement in symptoms, or to seek the care of the closest emergency department if he worsens with the above plan.   Deliah BostonMichael Adalei Novell, MHS, PA-C Primary Care at [facility] 07/12/2016 9:15 AM

## 2016-07-11 NOTE — Patient Instructions (Signed)
     IF you received an x-ray today, you will receive an invoice from [facility]. Please contact [facility] at [phone] with questions or concerns regarding your invoice.   IF you received labwork today, you will receive an invoice from [facility]. Please contact [facility] at [phone] with questions or concerns regarding your invoice.   Our billing staff will not be able to assist you with questions regarding bills from these companies.  You will be contacted with the lab results as soon as they are available. The fastest way to get your results is to activate your My Chart account. Instructions are located on the last page of this paperwork. If you have not heard from us regarding the results in 2 weeks, please contact this office.     

## 2016-07-28 ENCOUNTER — Encounter: Payer: Self-pay | Admitting: Gastroenterology

## 2016-08-02 ENCOUNTER — Ambulatory Visit (INDEPENDENT_AMBULATORY_CARE_PROVIDER_SITE_OTHER): Payer: Commercial Managed Care - PPO | Admitting: Physician Assistant

## 2016-08-02 ENCOUNTER — Encounter: Payer: Self-pay | Admitting: Physician Assistant

## 2016-08-02 VITALS — BP 128/82 | HR 88 | Temp 97.6°F | Resp 18 | Ht 68.66 in | Wt 213.6 lb

## 2016-08-02 DIAGNOSIS — M19012 Primary osteoarthritis, left shoulder: Secondary | ICD-10-CM

## 2016-08-02 DIAGNOSIS — M19011 Primary osteoarthritis, right shoulder: Secondary | ICD-10-CM | POA: Diagnosis not present

## 2016-08-02 DIAGNOSIS — G8929 Other chronic pain: Secondary | ICD-10-CM | POA: Diagnosis not present

## 2016-08-02 MED ORDER — FISH OIL BURP-LESS 1000 MG PO CAPS: 2000.0000 mg | ORAL_CAPSULE | Freq: Every day | ORAL | 1 refills | Status: DC

## 2016-08-02 MED ORDER — GABAPENTIN 100 MG PO CAPS: 100.0000 mg | ORAL_CAPSULE | Freq: Three times a day (TID) | ORAL | 1 refills | Status: DC

## 2016-08-02 NOTE — Patient Instructions (Addendum)
Start the gabapentin and fish oil.  Our office will call you with a referral to orthopedics.     IF you received an x-ray today, you will receive an [facility]. Please contact [facility] at [phone] with questions or concerns regarding your invoice.   IF you received labwork today, you will receive an invoice from [facility]. Please contact [facility] at [phone] with questions or concerns regarding your invoice.   Our billing staff will not be able to assist you with questions regarding bills from these companies.  You will be contacted with the lab results as soon as they are available. The fastest way to get your results is to activate your My Chart account. Instructions are located on the last page of this paperwork. If you have not heard from us regarding the results in 2 weeks, please contact this office.

## 2016-08-02 NOTE — Progress Notes (Signed)
    08/02/2016 3:47 PM   DOB: 11/06/1950 / MRN: [id number]  SUBJECTIVE:  Keith Cobb Echeverri is a 66 y.o. male presenting for a med consultation. He has stopped taking his Wellbutrin and says that it is not really helping him. He is taking 60 duloxetine BID and calls this his life line.  Taking Lisinopril 10 dialy with historically good control and no cough.  Take meloxicam for chronic pain and feels this does nothing for him, however once he learned the mechanism of action, explained as strong once dialy ibuprofen, he wants to keep this on board. He wants to try something else for pain and does not want a narcotic.  He aches everywhere on most day, and has a history of bilateral shoulder OA.   He is allergic to bee venom.   He  has a past medical history of Anxiety; Arthritis; Depression; and Hypertension.    He  reports that he has been smoking Cigarettes.  He has a 9.75 pack-year smoking history. He has never used smokeless tobacco. He reports that he drinks about 7.2 oz of alcohol per week . He reports that he does not use drugs. He  has no sexual activity history on file. The patient  has no past surgical history on file.  His family history includes Stroke in his father.  Review of Systems  Constitutional: Negative for chills, diaphoresis and fever.  Respiratory: Negative for cough, hemoptysis, sputum production, shortness of breath and wheezing.   Cardiovascular: Negative for chest pain, orthopnea and leg swelling.  Gastrointestinal: Negative for nausea.  Skin: Negative for rash.  Neurological: Negative for dizziness.    The problem list and medications were reviewed and updated by myself where necessary and exist elsewhere in the encounter.   OBJECTIVE:  BP 128/82   Pulse 88   Temp 97.6 F (36.4 C) (Oral)   Resp 18   Ht 5' 8.66" (1.744 m)   Wt 213 lb 9.6 oz (96.9 kg)   SpO2 97%   BMI 31.86 kg/m   Physical Exam  Constitutional: He appears well-developed. He is active and  cooperative.  Non-toxic appearance.  Cardiovascular: Normal rate.   Pulmonary/Chest: Effort normal. No tachypnea.  Neurological: He is alert.  Skin: Skin is warm and dry. He is not diaphoretic. No pallor.  Vitals reviewed.   No results found for this or any previous visit (from the past 72 hour(s)).  No results found.  ASSESSMENT AND PLAN:  Keith Cobb was seen today for follow-up.  Diagnoses and all orders for this visit:  Bilateral shoulder region arthritis: He will continue meloxicam and would like to talk to an orthopedist about his chronic bony pain.  I will refer him. He tells me he want double shoulder arthroscopy.    Other chronic pain: will try some additional low risk therapies and hopefully he will benefit.  -     gabapentin (NEURONTIN) 100 MG capsule; Take 1 capsule (100 mg total) by mouth 3 (three) times daily. -     Omega-3 Fatty Acids (FISH OIL BURP-LESS) 1000 MG CAPS; Take 2,000 mg by mouth daily.    The patient is advised to call or return to clinic if he does not see an improvement in symptoms, or to seek the care of the closest emergency department if he worsens with the above plan.   Keith Cobb, MHS, PA-C Primary Care at [facility] 08/02/2016 3:47 PM

## 2016-08-06 ENCOUNTER — Other Ambulatory Visit: Payer: Self-pay | Admitting: Physician Assistant

## 2016-08-12 ENCOUNTER — Encounter: Payer: Self-pay | Admitting: Physician Assistant

## 2016-08-12 NOTE — Addendum Note (Signed)
Addended by: Ofilia NeasLARK, Mckaila Duffus L on: 08/12/2016 01:38 PM   Modules accepted: Orders

## 2016-08-30 ENCOUNTER — Ambulatory Visit (INDEPENDENT_AMBULATORY_CARE_PROVIDER_SITE_OTHER): Payer: Commercial Managed Care - PPO | Admitting: Orthopedic Surgery

## 2016-08-30 ENCOUNTER — Encounter: Payer: Self-pay | Admitting: Physician Assistant

## 2016-08-31 ENCOUNTER — Other Ambulatory Visit: Payer: Self-pay | Admitting: Physician Assistant

## 2016-08-31 DIAGNOSIS — Z91030 Bee allergy status: Secondary | ICD-10-CM

## 2016-08-31 MED ORDER — EPINEPHRINE 0.3 MG/0.3ML IJ SOAJ: 0.3000 mg | Freq: Once | INTRAMUSCULAR | 1 refills | Status: AC

## 2016-09-07 ENCOUNTER — Ambulatory Visit (INDEPENDENT_AMBULATORY_CARE_PROVIDER_SITE_OTHER): Payer: Commercial Managed Care - PPO | Admitting: Orthopedic Surgery

## 2016-09-09 ENCOUNTER — Ambulatory Visit (INDEPENDENT_AMBULATORY_CARE_PROVIDER_SITE_OTHER): Payer: Self-pay

## 2016-09-09 ENCOUNTER — Encounter (INDEPENDENT_AMBULATORY_CARE_PROVIDER_SITE_OTHER): Payer: Self-pay | Admitting: Family

## 2016-09-09 ENCOUNTER — Ambulatory Visit (INDEPENDENT_AMBULATORY_CARE_PROVIDER_SITE_OTHER): Payer: Commercial Managed Care - PPO | Admitting: Family

## 2016-09-09 DIAGNOSIS — M1712 Unilateral primary osteoarthritis, left knee: Secondary | ICD-10-CM

## 2016-09-09 DIAGNOSIS — M25512 Pain in left shoulder: Secondary | ICD-10-CM

## 2016-09-09 DIAGNOSIS — M19012 Primary osteoarthritis, left shoulder: Secondary | ICD-10-CM | POA: Diagnosis not present

## 2016-09-09 DIAGNOSIS — M7542 Impingement syndrome of left shoulder: Secondary | ICD-10-CM | POA: Diagnosis not present

## 2016-09-09 DIAGNOSIS — M25511 Pain in right shoulder: Secondary | ICD-10-CM | POA: Diagnosis not present

## 2016-09-09 MED ORDER — METHYLPREDNISOLONE ACETATE 40 MG/ML IJ SUSP
40.0000 mg | INTRAMUSCULAR | Status: AC | PRN
  Administered 2016-09-09: 40 mg via INTRA_ARTICULAR

## 2016-09-09 MED ORDER — LIDOCAINE HCL 1 % IJ SOLN
5.0000 mL | INTRAMUSCULAR | Status: AC | PRN
  Administered 2016-09-09: 5 mL

## 2016-09-09 NOTE — Progress Notes (Signed)
Office Visit Note   Patient: Keith Cobb           Date of Birth: 10/13/1950           MRN: [id number] Visit Date: 09/09/2016              Requested by: Ofilia Neaslark, Michael L, PA-C [address] [place], [place] [id number] PCP: Ofilia Neaslark, Michael L, PA-C  Chief Complaint  Patient presents with  . Left Knee - Pain  . Left Shoulder - Pain      HPI: The patient is a 66 year old gentleman who presents today complaining of left shoulder and left knee pain.  Does have a known history of osteoarthritis left knee. States he's had Depo-Medrol injection in the past cannot remember if this was helpful. He does relate that he's been given a prescription for meloxicam in the past but has stopped taking this. The pain is worse with ambulation and weightbearing complains of popping and cracking no locking or giving way.  The left shoulder he complains of pain with above head reaching he cannot reach behind his back without pain. Pain with lifting heavy objects. He states had cortisone injection last month with his pcp which provided moderate relief. Has considered total shoulder replacement, is unsure about proceeding.   Assessment & Plan: Visit Diagnoses:  1. Acute pain of left shoulder   2. Bilateral shoulder pain, unspecified chronicity   3. Primary osteoarthritis of left knee     Plan: Depo medrol injection left knee and left shoulder. If no improvement left shoulder may consider mri rule out RC path.   due to end-stage osteoarthritis left shoulder and consideration of replacement patient follow-up with Dr. August Saucerean in 4 weeks for his left shoulder pain as well as his osteoarthritis left knee.  Follow-Up Instructions: Return in about 4 weeks (around 10/07/2016).   Left Knee Exam   Tenderness  The patient is experiencing no tenderness.     Range of Motion  The patient has normal left knee ROM.  Muscle Strength   The patient has normal left knee strength.  Tests  Varus: negative Valgus:  negative  Other  Effusion: no effusion present  Comments:  Crepitation   Left Shoulder Exam   Tenderness  The patient is experiencing tenderness in the biceps tendon.  Range of Motion  Forward Flexion: normal Left shoulder forward flexion: but painful.   Tests  Drop Arm: positive Impingement: positive  Other  Pulse: present       Patient is alert, oriented, no adenopathy, well-dressed, normal affect, normal respiratory effort.   Imaging: No results found. No images are attached to the encounter.  Labs: Lab Results  Component Value Date   HGBA1C 5.2 05/09/2016   HGBA1C 5.3 05/04/2015   HGBA1C 5.6 04/05/2014   LABURIC 6.0 12/31/2014   LABORGA NO GROWTH 05/30/2011    Orders:  Orders Placed This Encounter  Procedures  . XR Shoulder Left   No orders of the defined types were placed in this encounter.    Procedures: Large Joint Inj Date/Time: 09/09/2016 1:44 PM Performed by: Barnie DelZAMORA, ERIN R Authorized by: Barnie DelZAMORA, ERIN R   Consent Given by:  Patient Site marked: the procedure site was marked   Timeout: prior to procedure the correct patient, procedure, and site was verified   Indications:  Pain and diagnostic evaluation Location:  Knee Site:  L knee Needle Size:  22 G Needle Length:  1.5 inches Ultrasound Guidance: No   Fluoroscopic Guidance: No  Arthrogram: No   Medications:  5 mL lidocaine 1 %; 40 mg methylPREDNISolone acetate 40 MG/ML Aspiration Attempted: No   Patient tolerance:  Patient tolerated the procedure well with no immediate complications Large Joint Inj Date/Time: 09/09/2016 1:44 PM Performed by: Adonis Huguenin Authorized by: Barnie Del R   Consent Given by:  Patient Site marked: the procedure site was marked   Timeout: prior to procedure the correct patient, procedure, and site was verified   Indications:  Pain and diagnostic evaluation Location:  Shoulder Site:  L subacromial bursa Prep: patient was prepped and draped in  usual sterile fashion   Needle Size:  22 G Needle Length:  1.5 inches Ultrasound Guidance: No   Fluoroscopic Guidance: No   Arthrogram: No   Medications:  5 mL lidocaine 1 %; 40 mg methylPREDNISolone acetate 40 MG/ML Aspiration Attempted: No   Patient tolerance:  Patient tolerated the procedure well with no immediate complications    Clinical Data: No additional findings.  ROS:  All other systems negative, except as noted in the HPI. Review of Systems  Constitutional: Negative for chills and fever.  Musculoskeletal: Positive for arthralgias and joint swelling.  Skin: Negative for color change.  Neurological: Negative for weakness.    Objective: Vital Signs: There were no vitals taken for this visit.  Specialty Comments:  No specialty comments available.  PMFS History: Patient Active Problem List   Diagnosis Date Noted  . Smoker 05/04/2015  . HTN (hypertension) 03/19/2012  . Depression 03/19/2012   Past Medical History:  Diagnosis Date  . Anxiety   . Arthritis   . Depression   . Hypertension     Family History  Problem Relation Age of Onset  . Stroke Father     No past surgical history on file. Social History   Occupational History  . Not on file.   Social History Main Topics  . Smoking status: Current Some Day Smoker    Packs/day: 0.25    Years: 39.00    Types: Cigarettes  . Smokeless tobacco: Never Used  . Alcohol use 7.2 oz/week    12 Cans of beer per week  . Drug use: No  . Sexual activity: Not on file

## 2016-09-26 ENCOUNTER — Ambulatory Visit (INDEPENDENT_AMBULATORY_CARE_PROVIDER_SITE_OTHER): Payer: Commercial Managed Care - PPO | Admitting: Physician Assistant

## 2016-09-26 ENCOUNTER — Encounter: Payer: Self-pay | Admitting: Physician Assistant

## 2016-09-26 VITALS — BP 110/76 | HR 100 | Temp 97.5°F | Resp 16 | Ht 68.6 in | Wt 213.0 lb

## 2016-09-26 DIAGNOSIS — R05 Cough: Secondary | ICD-10-CM | POA: Diagnosis not present

## 2016-09-26 DIAGNOSIS — R059 Cough, unspecified: Secondary | ICD-10-CM

## 2016-09-26 MED ORDER — HYDROCODONE-HOMATROPINE 5-1.5 MG/5ML PO SYRP: 5.0000 mL | ORAL_SOLUTION | Freq: Every day | ORAL | 0 refills | Status: DC

## 2016-09-26 MED ORDER — DOXYCYCLINE HYCLATE 100 MG PO CAPS: 100.0000 mg | ORAL_CAPSULE | Freq: Two times a day (BID) | ORAL | 0 refills | Status: AC

## 2016-09-26 MED ORDER — PREDNISONE 20 MG PO TABS: 40.0000 mg | ORAL_TABLET | Freq: Every day | ORAL | 0 refills | Status: DC

## 2016-09-26 NOTE — Progress Notes (Signed)
    09/30/2016 1:36 PM   DOB: April 27, 1950 / MRN: [id number]  SUBJECTIVE:  Keith Cobb is a 66 y.o. male presenting for chest congestion.  Present for about 1 week now and worsening.  Several OTCs without relief at this point. No hemoptysis or chest pain. No fever, chills.  Does feel that he is sweaty.   He is allergic to bee venom.   He  has a past medical history of Anxiety; Arthritis; Depression; and Hypertension.    He  reports that he has been smoking Cigarettes.  He has a 9.75 pack-year smoking history. He has never used smokeless tobacco. He reports that he drinks about 7.2 oz of alcohol per week . He reports that he does not use drugs. He  has no sexual activity history on file. The patient  has no past surgical history on file.  His family history includes Stroke in his father.  Review of Systems  Constitutional: Positive for diaphoresis. Negative for chills and fever.  Respiratory: Positive for cough. Negative for hemoptysis, sputum production, shortness of breath and wheezing.   Cardiovascular: Negative for chest pain.  Gastrointestinal: Negative for nausea.  Skin: Negative for rash.  Neurological: Negative for dizziness.    The problem list and medications were reviewed and updated by myself where necessary and exist elsewhere in the encounter.   OBJECTIVE:  BP 110/76 (BP Location: Right Arm, Patient Position: Sitting, Cuff Size: Large)   Pulse 100   Temp (!) 97.5 F (36.4 C) (Oral)   Resp 16   Ht 5' 8.6" (1.742 m)   Wt 213 lb (96.6 kg)   BMI 31.82 kg/m   Pulse Readings from Last 3 Encounters:  09/26/16 100  08/02/16 88  07/11/16 95     Physical Exam  Constitutional: He appears well-developed. He is active and cooperative.  Non-toxic appearance.  Cardiovascular: Normal rate.   Pulmonary/Chest: Effort normal and breath sounds normal. No tachypnea. No respiratory distress. He has no wheezes. He has no rales. He exhibits no tenderness.  Neurological: He is  alert.  Skin: Skin is warm and dry. He is not diaphoretic. No pallor.  Vitals reviewed.   No results found for this or any previous visit (from the past 72 hour(s)).  No results found.  ASSESSMENT AND PLAN:  [name] was seen today for chest congestion.  Diagnoses and all orders for this visit:  Cough: He is misrable.  There may be an allergy component. Will cover for an atypical pneumonia given lack of focal lung exam.   -     doxycycline (VIBRAMYCIN) 100 MG capsule; Take 1 capsule (100 mg total) by mouth 2 (two) times daily. -     predniSONE (DELTASONE) 20 MG tablet; Take 2 tablets (40 mg total) by mouth daily with breakfast. -     HYDROcodone-homatropine (HYCODAN) 5-1.5 MG/5ML syrup; Take 5 mLs by mouth at bedtime.    The patient is advised to call or return to clinic if he does not see an improvement in symptoms, or to seek the care of the closest emergency department if he worsens with the above plan.   Deliah Boston, MHS, PA-C Primary Care at [facility] 09/30/2016 1:36 PM

## 2016-09-26 NOTE — Patient Instructions (Addendum)
  Come back in 5 days if you are not feeling better. If you are getting sicker come back sooner.    IF you received an x-ray today, you will receive an invoice from [facility]. Please contact [facility] at [phone] with questions or concerns regarding your invoice.   IF you received labwork today, you will receive an invoice from [facility]. Please contact [facility] at [phone] with questions or concerns regarding your invoice.   Our billing staff will not be able to assist you with questions regarding bills from these companies.  You will be contacted with the lab results as soon as they are available. The fastest way to get your results is to activate your My Chart account. Instructions are located on the last page of this paperwork. If you have not heard from [place] regarding the results in 2 weeks, please contact this office.

## 2016-09-27 ENCOUNTER — Other Ambulatory Visit: Payer: Self-pay | Admitting: Physician Assistant

## 2016-09-27 DIAGNOSIS — F430 Acute stress reaction: Secondary | ICD-10-CM

## 2016-09-27 DIAGNOSIS — G8929 Other chronic pain: Secondary | ICD-10-CM

## 2016-10-10 ENCOUNTER — Encounter (INDEPENDENT_AMBULATORY_CARE_PROVIDER_SITE_OTHER): Payer: Self-pay | Admitting: Orthopedic Surgery

## 2016-10-10 ENCOUNTER — Telehealth (INDEPENDENT_AMBULATORY_CARE_PROVIDER_SITE_OTHER): Payer: Self-pay

## 2016-10-10 ENCOUNTER — Ambulatory Visit (INDEPENDENT_AMBULATORY_CARE_PROVIDER_SITE_OTHER): Payer: Commercial Managed Care - PPO | Admitting: Orthopedic Surgery

## 2016-10-10 DIAGNOSIS — M19012 Primary osteoarthritis, left shoulder: Secondary | ICD-10-CM | POA: Diagnosis not present

## 2016-10-10 NOTE — Telephone Encounter (Signed)
Emailed Josh with [employer].

## 2016-10-10 NOTE — Telephone Encounter (Signed)
Dr August Saucer saw patient today and is sending him for CT scan thin cut for [facility] protocol and patient Is to come back and see him after but in the meantime, he wanted Josh with [employer] to be contacted and let him know that he will be needing patient specific instrumentation for this patient.

## 2016-10-11 ENCOUNTER — Ambulatory Visit: Payer: Commercial Managed Care - PPO | Admitting: Physician Assistant

## 2016-10-13 ENCOUNTER — Ambulatory Visit (INDEPENDENT_AMBULATORY_CARE_PROVIDER_SITE_OTHER): Payer: Commercial Managed Care - PPO | Admitting: Physician Assistant

## 2016-10-13 ENCOUNTER — Encounter: Payer: Self-pay | Admitting: Physician Assistant

## 2016-10-13 VITALS — BP 100/68 | HR 110 | Temp 97.1°F | Resp 16 | Ht 68.6 in | Wt 214.0 lb

## 2016-10-13 DIAGNOSIS — M19012 Primary osteoarthritis, left shoulder: Secondary | ICD-10-CM | POA: Diagnosis not present

## 2016-10-13 DIAGNOSIS — M19011 Primary osteoarthritis, right shoulder: Secondary | ICD-10-CM

## 2016-10-13 DIAGNOSIS — F39 Unspecified mood [affective] disorder: Secondary | ICD-10-CM

## 2016-10-13 NOTE — Progress Notes (Signed)
Office Visit Note   Patient: Keith Cobb           Date of Birth: 1950-07-01           MRN: [id number] Visit Date: 10/10/2016 Requested by: Ofilia Neas, PA-C [address] [place], [place] [address] PCP: Silvestre Mesi  Subjective: Chief Complaint  Patient presents with  . Left Shoulder - Pain  . Left Knee - Pain    HPI: Keith Cobb is a 66 year old patient with left shoulder pain.  Patient had an injection done 09/09/2016 which helped her only a very short period of time.  He is right-hand dominant.  The pain in his left shoulder wakes him from sleep at night.  He reports grinding in the shoulder as well as decreased range of motion.  He does take medication for pain.  Patient also describes left knee pain with no catching but it's difficult for him to weight-bear.  States he has good and bad days.  He does have a fear of falling.  Reports that he uses a brace.  He reports generalized global pain in the knee.              ROS: All systems reviewed are negative as they relate to the chief complaint within the history of present illness.  Patient denies  fevers or chills.   Assessment & Plan: Visit Diagnoses:  1. Arthritis of left shoulder region     Plan: Impression is left shoulder glenohumeral arthritis.  Plan is shoulder replacement.  Had a long talk with Keith Cobb and his wife today about operative and nonoperative options.  In general he has pretty reasonable function at this time but a lot of pain.  Does have glenohumeral arthritis by plain radiographs with humeral osteophytes and absence of glenohumeral articular space.  Risks and benefits of surgery are discussed including not limited to infection or vessel damage no increase in function and incomplete pain relief.  Patient understands risks and benefits and wishes to proceed.  It looks like he may have some posterior wear on the glenoid and therefore patient's specific instrumentation is indicated to optimize glenoid placement  in this patient.  Planned thin cut CT scan left shoulder.  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  Aspiration performed today.  I him back after his CT scan and we will proceed with surgical intervention.  All questions answered  Follow-Up Instructions: No Follow-up on file.   Orders:  Orders Placed This Encounter  Procedures  . CT SHOULDER LEFT WO CONTRAST   No orders of the defined types were placed in this encounter.     Procedures: No procedures performed   Clinical Data: No additional findings.  Objective: Vital Signs: There were no vitals taken for this visit.  Physical Exam:   Constitutional: Patient appears well-developed HEENT:  Head: Normocephalic Eyes:EOM are normal Neck: Normal range of motion Cardiovascular: Normal rate Pulmonary/chest: Effort normal Neurologic: Patient is alert Skin: Skin is warm Psychiatric: Patient has normal mood and affect    Ortho Exam: Orthopedic exam demonstrates reasonable rotator cuff strength on the left-hand side to infraspinatus super space and subscap muscle testing.  He does have about 120 of forward flexion slightly less than 90 of abduction and about 35 of external rotation.  Course grinding is present with internal/external rotation of that left arm.  Left knee demonstrates valgus alignment effusion intact extensor mechanism stable collateral and cruciate  ligaments with palpable pedal pulses.  Specialty Comments:  No specialty comments available.  Imaging: No results found.   PMFS History: Patient Active Problem List   Diagnosis Date Noted  . Smoker 05/04/2015  . HTN (hypertension) 03/19/2012  . Depression 03/19/2012   Past Medical History:  Diagnosis Date  . Anxiety   . Arthritis   . Depression   . Hypertension     Family History  Problem Relation Age of Onset  . Stroke Father     No past surgical history on file. Social History   Occupational History    . Not on file.   Social History Main Topics  . Smoking status: Current Some Day Smoker    Packs/day: 0.25    Years: 39.00    Types: Cigarettes  . Smokeless tobacco: Never Used  . Alcohol use 7.2 oz/week    12 Cans of beer per week  . Drug use: No  . Sexual activity: Not on file

## 2016-10-13 NOTE — Progress Notes (Signed)
10/13/2016 2:10 PM   DOB: August 16, 1950 / MRN: [id number]  SUBJECTIVE:  Keith Cobb is a 66 y.o. male presenting for med consult.  Here because his wife wants his meds changed.  He does not feel this way.  Meds below are current.  BP well controlled.  He drinks 12 beers daily, always has relatively normal labs, and does not plan to stop drinking. Has expressed to me concern and guilt over his mother-in-law who lives in his home and his wife is the primary care taker. Feels that he will never get "his time" as his mother-in-law commands too much care from he and his wife.    Seeing Dr. August Saucer for chronic shoulder pain and he is a candidate for surgery.  Dr. August Saucer was realistic with him regarding ROM.  Patient continues to ponder the benefits and risks of surgery at this time.    Depression screen PHQ 2/9 10/13/2016  Decreased Interest 0  Down, Depressed, Hopeless 0  PHQ - 2 Score 0  Altered sleeping -  Tired, decreased energy -  Change in appetite -  Feeling bad or failure about yourself  -  Trouble concentrating -  Moving slowly or fidgety/restless -  Suicidal thoughts -  PHQ-9 Score -  Difficult doing work/chores -      Current Outpatient Prescriptions:  .  DULoxetine (CYMBALTA) 60 MG capsule, Take 1 capsule (60 mg total) by mouth 2 (two) times daily., Disp: 180 capsule, Rfl: 3 .  gabapentin (NEURONTIN) 100 MG capsule, Take 1 capsule (100 mg total) by mouth 3 (three) times daily., Disp: 90 capsule, Rfl: 1 .  lisinopril (PRINIVIL,ZESTRIL) 10 MG tablet, TAKE ONE TABLET BY MOUTH ONCE DAILY FOR BLOOD PRESSURE, Disp: 90 tablet, Rfl: 3   He is allergic to bee venom.   He  has a past medical history of Anxiety; Arthritis; Depression; and Hypertension.    He  reports that he has been smoking Cigarettes.  He has a 9.75 pack-year smoking history. He has never used smokeless tobacco. He reports that he drinks about 7.2 oz of alcohol per week . He reports that he does not use drugs. He  has  no sexual activity history on file. The patient  has no past surgical history on file.  His family history includes Stroke in his father.  Review of Systems  Constitutional: Negative for chills, diaphoresis and fever.  Respiratory: Negative for cough, hemoptysis, sputum production, shortness of breath and wheezing.   Cardiovascular: Negative for chest pain, orthopnea and leg swelling.  Gastrointestinal: Negative for abdominal pain, blood in stool, constipation, diarrhea, heartburn, melena, nausea and vomiting.  Genitourinary: Negative for flank pain.  Skin: Negative for rash.  Neurological: Negative for dizziness.    The problem list and medications were reviewed and updated by myself where necessary and exist elsewhere in the encounter.   OBJECTIVE:  BP 100/68 (BP Location: Left Arm, Patient Position: Sitting, Cuff Size: Large)   Pulse (!) 110   Temp (!) 97.1 F (36.2 C) (Oral)   Resp 16   Ht 5' 8.6" (1.742 m)   Wt 214 lb (97.1 kg)   SpO2 99%   BMI 31.97 kg/m   Pulse Readings from Last 3 Encounters:  10/13/16 (!) 110  09/26/16 100  08/02/16 88   Lab Results  Component Value Date   WBC 6.2 05/09/2016   HGB 16.0 05/09/2016   HCT 47.5 05/09/2016   MCV 92 05/09/2016   PLT 190 05/09/2016  Lab Results  Component Value Date   CREATININE 0.78 05/09/2016   BUN 16 05/09/2016   NA 140 05/09/2016   K 4.4 05/09/2016   CL 100 05/09/2016   CO2 24 05/09/2016    Lab Results  Component Value Date   ALT 19 05/09/2016   AST 15 05/09/2016   ALKPHOS 86 05/09/2016   BILITOT 0.7 05/09/2016    Lab Results  Component Value Date   TSH 1.490 05/09/2016    Lab Results  Component Value Date   HGBA1C 5.2 05/09/2016    Lab Results  Component Value Date   CHOL 173 05/09/2016   HDL 53 05/09/2016   LDLCALC 87 05/09/2016   TRIG 164 (H) 05/09/2016   CHOLHDL 3.3 05/09/2016       Physical Exam  Constitutional: He appears well-developed. He is active and cooperative.   Non-toxic appearance.  Cardiovascular: Normal rate, regular rhythm, S1 normal, S2 normal, normal heart sounds, intact distal pulses and normal pulses.  Exam reveals no gallop and no friction rub.   No murmur heard. Pulmonary/Chest: Effort normal. No stridor. No tachypnea. No respiratory distress. He has no wheezes. He has no rales.  Abdominal: He exhibits no distension.  Musculoskeletal: He exhibits no edema.  Neurological: He is alert.  Skin: Skin is warm and dry. He is not diaphoretic. No pallor.  Vitals reviewed.   No results found for this or any previous visit (from the past 72 hour(s)).  No results found.  ASSESSMENT AND PLAN:  [name] was seen today for advice only.  Diagnoses and all orders for this visit:  Bilateral shoulder region arthritis Comments: He is considering right shoulder arthoplasty and is managed by Dr. August Saucer.     Episodic mood disorder (HCC): Continue duloxetine.    The patient is advised to call or return to clinic if he does not see an improvement in symptoms, or to seek the care of the closest emergency department if he worsens with the above plan.   Deliah Boston, MHS, PA-C Primary Care at [facility] 10/13/2016 2:10 PM

## 2016-10-13 NOTE — Patient Instructions (Signed)
     IF you received an x-ray today, you will receive an invoice from [facility]. Please contact [facility] at [phone] with questions or concerns regarding your invoice.   IF you received labwork today, you will receive an invoice from [facility]. Please contact [facility] at [phone] with questions or concerns regarding your invoice.   Our billing staff will not be able to assist you with questions regarding bills from these companies.  You will be contacted with the lab results as soon as they are available. The fastest way to get your results is to activate your My Chart account. Instructions are located on the last page of this paperwork. If you have not heard from us regarding the results in 2 weeks, please contact this office.     

## 2016-10-29 ENCOUNTER — Other Ambulatory Visit: Payer: Self-pay | Admitting: Physician Assistant

## 2016-11-02 ENCOUNTER — Telehealth: Payer: Self-pay | Admitting: Physician Assistant

## 2016-11-02 NOTE — Telephone Encounter (Signed)
Pt is calling to get a refill on his gabapentin  Best number [phone]

## 2016-11-03 ENCOUNTER — Encounter: Payer: Self-pay | Admitting: Physician Assistant

## 2016-11-03 DIAGNOSIS — G8929 Other chronic pain: Secondary | ICD-10-CM

## 2016-11-03 MED ORDER — GABAPENTIN 100 MG PO CAPS: 100.0000 mg | ORAL_CAPSULE | Freq: Three times a day (TID) | ORAL | 1 refills | Status: DC

## 2016-11-04 NOTE — Telephone Encounter (Signed)
Ordered on 10/25.

## 2017-01-13 ENCOUNTER — Ambulatory Visit: Payer: Commercial Managed Care - PPO | Admitting: Physician Assistant

## 2017-01-16 ENCOUNTER — Ambulatory Visit: Payer: Commercial Managed Care - PPO | Admitting: Physician Assistant

## 2017-01-16 ENCOUNTER — Encounter: Payer: Self-pay | Admitting: Physician Assistant

## 2017-01-16 ENCOUNTER — Other Ambulatory Visit: Payer: Self-pay

## 2017-01-16 VITALS — BP 130/82 | HR 87 | Temp 97.5°F | Resp 16 | Ht 68.6 in | Wt 216.8 lb

## 2017-01-16 DIAGNOSIS — F39 Unspecified mood [affective] disorder: Secondary | ICD-10-CM

## 2017-01-16 DIAGNOSIS — M2550 Pain in unspecified joint: Secondary | ICD-10-CM | POA: Diagnosis not present

## 2017-01-16 DIAGNOSIS — G8929 Other chronic pain: Secondary | ICD-10-CM | POA: Diagnosis not present

## 2017-01-16 MED ORDER — NAPROXEN 500 MG PO TABS: 500.0000 mg | ORAL_TABLET | Freq: Every day | ORAL | 0 refills | Status: DC

## 2017-01-16 NOTE — Progress Notes (Signed)
01/18/2017 9:23 AM   DOB: Jun 14, 1950 / MRN: [id number]  SUBJECTIVE:  Keith Cobb is a 67 y.o. male presenting for recheck.  Complains of feelings of sadness.  His mother in-law moved out of the house and he felt this would enable him to start feeling better, however he finds himself still feeling dysthymic most of time.  He wonders if his duloxetine is still helping him.  He wants to change this medication but in the past has he has told me several times this is his "lifeline."  He has seen psychiatry in the past and tells me this never helped him.   He wonders if his chronic joint pain is 2/2 a rheumatological disease.  He is largely managed by ortho at this point and shoulder replacement has been recommended.    He is allergic to bee venom.   He  has a past medical history of Anxiety, Arthritis, Depression, and Hypertension.    He  reports that he has been smoking cigarettes.  He has a 9.75 pack-year smoking history. he has never used smokeless tobacco. He reports that he drinks about 7.2 oz of alcohol per week. He reports that he does not use drugs. He  has no sexual activity history on file. The patient  has no past surgical history on file.  His family history includes Stroke in his father.  Review of Systems  Constitutional: Negative for chills, diaphoresis and fever.  Eyes: Negative.   Respiratory: Negative for cough, hemoptysis, sputum production, shortness of breath and wheezing.   Cardiovascular: Negative for chest pain, orthopnea and leg swelling.  Gastrointestinal: Negative for nausea.  Musculoskeletal: Positive for joint pain and myalgias.  Skin: Negative for rash.  Neurological: Negative for dizziness, sensory change, speech change, focal weakness and headaches.    The problem list and medications were reviewed and updated by myself where necessary and exist elsewhere in the encounter.   OBJECTIVE:  BP 130/82 (BP Location: Right Arm, Patient Position: Sitting, Cuff  Size: Normal)   Pulse 87   Temp (!) 97.5 F (36.4 C) (Oral)   Resp 16   Ht 5' 8.6" (1.742 m)   Wt 216 lb 12.8 oz (98.3 kg)   SpO2 98%   BMI 32.39 kg/m   Physical Exam  Constitutional: He appears well-developed. He is active and cooperative.  Non-toxic appearance.  Cardiovascular: Normal rate, regular rhythm, S1 normal, S2 normal, normal heart sounds, intact distal pulses and normal pulses. Exam reveals no gallop and no friction rub.  No murmur heard. Pulmonary/Chest: Effort normal. No stridor. No tachypnea. No respiratory distress. He has no wheezes. He has no rales.  Abdominal: He exhibits no distension.  Musculoskeletal: He exhibits no edema.  Neurological: He is alert.  Skin: Skin is warm and dry. He is not diaphoretic. No pallor.  Vitals reviewed.   Results for orders placed or performed in visit on 01/16/17 (from the past 72 hour(s))  Sedimentation Rate     Status: None   Collection Time: 01/16/17  3:19 PM  Result Value Ref Range   Sed Rate 9 0 - 30 mm/hr  C-reactive protein     Status: None   Collection Time: 01/16/17  3:19 PM  Result Value Ref Range   CRP 1.7 0.0 - 4.9 mg/L  Rheumatoid factor     Status: None   Collection Time: 01/16/17  3:19 PM  Result Value Ref Range   Rhuematoid fact SerPl-aCnc 12.2 0.0 - 13.9 IU/mL  CYCLIC  CITRUL PEPTIDE ANTIBODY, IGG/IGA     Status: None   Collection Time: 01/16/17  3:19 PM  Result Value Ref Range   Cyclic Citrullin Peptide Ab 8 0 - 19 units    Comment:                           Negative               <20                           Weak positive      20 - 39                           Moderate positive  40 - 59                           Strong positive        >59   ANA, IFA (with reflex)     Status: None (Preliminary result)   Collection Time: 01/16/17  3:19 PM  Result Value Ref Range   ANA Titer 1 WILL FOLLOW     No results found.  ASSESSMENT AND PLAN:  Keith Cobb was seen today for follow-up.  Diagnoses and all  orders for this visit:  Chronic joint pain: I would expect an rheumatological condition to be evident by this time given the chronicity of his symptoms.  He lacks extra-articular symptoms. I suspect his labs will be negative however will screen per his request.  -     Sedimentation Rate -     C-reactive protein -     Rheumatoid factor -     Cancel: ANA, IFA Comprehensive Panel -     Cancel: Cyclic Citrul Peptide Antibody, IGG -     naproxen (NAPROSYN) 500 MG tablet; Take 1 tablet (500 mg total) by mouth at bedtime. -     ANA, IFA (with reflex) -     CYCLIC CITRUL PEPTIDE ANTIBODY, IGG/IGA -     CYCLIC CITRUL PEPTIDE ANTIBODY, IGG/IGA -     ANA, IFA (with reflex)  Episodic mood disorder: No SI/HI.  In addition to his duloxetine we have tried Wellbutrin and hydroxazine, neither of which provided any benefit.  He does drink and does not plan to stop.  I will see him back in about three months.   The patient is advised to call or return to clinic if he does not see an improvement in symptoms, or to seek the care of the closest emergency department if he worsens with the above plan.   Deliah BostonMichael Rashay Barnette, MHS, PA-C [facility] at [facility] 01/18/2017 9:23 AM

## 2017-01-16 NOTE — Patient Instructions (Signed)
     IF you received an x-ray today, you will receive an invoice from [facility]. Please contact [facility] at [phone] with questions or concerns regarding your invoice.   IF you received labwork today, you will receive an invoice from [facility]. Please contact [facility] at [phone] with questions or concerns regarding your invoice.   Our billing staff will not be able to assist you with questions regarding bills from these companies.  You will be contacted with the lab results as soon as they are available. The fastest way to get your results is to activate your My Chart account. Instructions are located on the last page of this paperwork. If you have not heard from us regarding the results in 2 weeks, please contact this office.     

## 2017-01-18 LAB — CYCLIC CITRUL PEPTIDE ANTIBODY, IGG/IGA: Cyclic Citrullin Peptide Ab: 8 units (ref 0–19)

## 2017-01-18 LAB — SEDIMENTATION RATE: Sed Rate: 9 mm/hr (ref 0–30)

## 2017-01-18 LAB — C-REACTIVE PROTEIN: CRP: 1.7 mg/L (ref 0.0–4.9)

## 2017-01-18 LAB — ANTINUCLEAR ANTIBODIES, IFA: ANA Titer 1: NEGATIVE

## 2017-01-18 LAB — RHEUMATOID FACTOR: RHEUMATOID FACTOR: 12.2 [IU]/mL (ref 0.0–13.9)

## 2017-02-15 ENCOUNTER — Other Ambulatory Visit: Payer: Self-pay | Admitting: Physician Assistant

## 2017-02-15 DIAGNOSIS — F3162 Bipolar disorder, current episode mixed, moderate: Secondary | ICD-10-CM

## 2017-04-11 ENCOUNTER — Other Ambulatory Visit: Payer: Self-pay | Admitting: Physician Assistant

## 2017-04-11 DIAGNOSIS — M2550 Pain in unspecified joint: Principal | ICD-10-CM

## 2017-04-11 DIAGNOSIS — G8929 Other chronic pain: Secondary | ICD-10-CM

## 2017-04-11 DIAGNOSIS — F3162 Bipolar disorder, current episode mixed, moderate: Secondary | ICD-10-CM

## 2017-04-12 MED ORDER — DULOXETINE HCL 60 MG PO CPEP: 60.0000 mg | ORAL_CAPSULE | Freq: Two times a day (BID) | ORAL | 0 refills | Status: DC

## 2017-05-08 ENCOUNTER — Other Ambulatory Visit: Payer: Self-pay | Admitting: Physician Assistant

## 2017-05-08 DIAGNOSIS — M2550 Pain in unspecified joint: Principal | ICD-10-CM

## 2017-05-08 DIAGNOSIS — G8929 Other chronic pain: Secondary | ICD-10-CM

## 2017-05-08 DIAGNOSIS — F3162 Bipolar disorder, current episode mixed, moderate: Secondary | ICD-10-CM

## 2017-05-12 ENCOUNTER — Encounter: Payer: Commercial Managed Care - PPO | Admitting: Physician Assistant

## 2017-05-19 ENCOUNTER — Ambulatory Visit: Payer: Commercial Managed Care - PPO | Admitting: Physician Assistant

## 2017-05-19 ENCOUNTER — Encounter: Payer: Self-pay | Admitting: Physician Assistant

## 2017-05-19 VITALS — BP 124/80 | HR 91 | Temp 97.7°F | Resp 16 | Ht 68.6 in | Wt 217.6 lb

## 2017-05-19 DIAGNOSIS — M2550 Pain in unspecified joint: Secondary | ICD-10-CM | POA: Diagnosis not present

## 2017-05-19 DIAGNOSIS — I10 Essential (primary) hypertension: Secondary | ICD-10-CM | POA: Diagnosis not present

## 2017-05-19 DIAGNOSIS — F3162 Bipolar disorder, current episode mixed, moderate: Secondary | ICD-10-CM

## 2017-05-19 DIAGNOSIS — G8929 Other chronic pain: Secondary | ICD-10-CM

## 2017-05-19 MED ORDER — LISINOPRIL 10 MG PO TABS
ORAL_TABLET | ORAL | 3 refills | Status: DC
Start: 2017-05-19 — End: 2017-10-25

## 2017-05-19 MED ORDER — NAPROXEN 500 MG PO TABS: 500.0000 mg | ORAL_TABLET | Freq: Every day | ORAL | 0 refills | Status: DC

## 2017-05-19 MED ORDER — DULOXETINE HCL 60 MG PO CPEP: 60.0000 mg | ORAL_CAPSULE | Freq: Two times a day (BID) | ORAL | 0 refills | Status: DC

## 2017-05-19 MED ORDER — PREDNISONE 50 MG PO TABS: 50.0000 mg | ORAL_TABLET | Freq: Every day | ORAL | 0 refills | Status: AC

## 2017-05-19 NOTE — Progress Notes (Signed)
05/21/2017 6:23 PM   DOB: [birth year] / MRN: [id number]  SUBJECTIVE:  Keith Cobb is a 67 y.o. male presenting for discussion of chronic pain symptoms.  Has bilateral shoulder pain and has seen Dr. August Saucer in ortho who is willing to operate should Keith Cobb desire that.  Keith Cobb feels he is not ready for this and has seen some improvement in his pain lately because he has to pull himself up into a [employer] with large tires.  Overall he feels well.  He continues taking his duloxetine and lisinopril.  He has not been drinking as much lately.   He is allergic to bee venom.   He  has a past medical history of Anxiety, Arthritis, Depression, and Hypertension.    He  reports that he has been smoking cigarettes.  He has a 9.75 pack-year smoking history. He has never used smokeless tobacco. He reports that he drinks about 7.2 oz of alcohol per week. He reports that he does not use drugs. He  has no sexual activity history on file. The patient  has no past surgical history on file.  His family history includes Stroke in his father.  Review of Systems  Constitutional: Negative for chills, diaphoresis and fever.  Eyes: Negative.   Respiratory: Negative for cough, hemoptysis, sputum production, shortness of breath and wheezing.   Cardiovascular: Negative for chest pain, orthopnea and leg swelling.  Gastrointestinal: Negative for nausea.  Musculoskeletal: Positive for joint pain and myalgias. Negative for back pain, falls and neck pain.  Skin: Negative for rash.  Neurological: Negative for dizziness, sensory change, speech change, focal weakness and headaches.    The problem list and medications were reviewed and updated by myself where necessary and exist elsewhere in the encounter.   OBJECTIVE:  BP 124/80 (BP Location: Left Arm, Patient Position: Sitting, Cuff Size: Normal)   Pulse 91   Temp 97.7 F (36.5 C) (Oral)   Resp 16   Ht 5' 8.6" (1.742 m)   Wt 217 lb 9.6 oz (98.7 kg)   SpO2  97%   BMI 32.51 kg/m   Physical Exam  Constitutional: He is oriented to person, place, and time. He appears well-developed. He does not appear ill.  Eyes: Pupils are equal, round, and reactive to light. Conjunctivae and EOM are normal.  Cardiovascular: Normal rate, regular rhythm, S1 normal, S2 normal, normal heart sounds, intact distal pulses and normal pulses. Exam reveals no gallop and no friction rub.  No murmur heard. Pulmonary/Chest: Effort normal. No stridor. No respiratory distress. He has no wheezes. He has no rales.  Abdominal: He exhibits no distension.  Musculoskeletal: Normal range of motion. He exhibits tenderness (left shoulder proper with joint line tenderness). He exhibits no edema.  Neurological: He is alert and oriented to person, place, and time. No cranial nerve deficit. Coordination normal.  Skin: Skin is warm and dry. He is not diaphoretic.  Psychiatric: He has a normal mood and affect.  Nursing note and vitals reviewed.   Lab Results  Component Value Date   HGBA1C 5.4 05/19/2017   Lab Results  Component Value Date   WBC 5.9 05/19/2017   HGB 16.4 05/19/2017   HCT 47.2 05/19/2017   MCV 90 05/19/2017   PLT 211 05/19/2017    Lab Results  Component Value Date   CREATININE 0.86 05/19/2017   BUN 13 05/19/2017   NA 139 05/19/2017   K 4.6 05/19/2017   CL 101 05/19/2017   CO2  23 05/19/2017    Lab Results  Component Value Date   ALT 38 05/19/2017   AST 44 (H) 05/19/2017   ALKPHOS 66 05/19/2017   BILITOT 0.9 05/19/2017    Lab Results  Component Value Date   TSH 1.490 05/09/2016    Lab Results  Component Value Date   HGBA1C 5.4 05/19/2017    Lab Results  Component Value Date   CHOL 155 05/19/2017   HDL 54 05/19/2017   LDLCALC 77 05/19/2017   TRIG 119 05/19/2017   CHOLHDL 2.9 05/19/2017      Results for orders placed or performed in visit on 05/19/17 (from the past 72 hour(s))  CBC     Status: None   Collection Time: 05/19/17  4:04 PM    Result Value Ref Range   WBC 5.9 3.4 - 10.8 x10E3/uL   RBC 5.27 4.14 - 5.80 x10E6/uL   Hemoglobin 16.4 13.0 - 17.7 g/dL   Hematocrit 45.4 09.8 - 51.0 %   MCV 90 79 - 97 fL   MCH 31.1 26.6 - 33.0 pg   MCHC 34.7 31.5 - 35.7 g/dL   RDW 11.9 14.7 - 82.9 %   Platelets 211 150 - 379 x10E3/uL  Hemoglobin A1c     Status: None   Collection Time: 05/19/17  4:04 PM  Result Value Ref Range   Hgb A1c MFr Bld 5.4 4.8 - 5.6 %    Comment:          Prediabetes: 5.7 - 6.4          Diabetes: >6.4          Glycemic control for adults with diabetes: <7.0    Est. average glucose Bld gHb Est-mCnc 108 mg/dL  Basic metabolic panel     Status: Abnormal   Collection Time: 05/19/17  4:04 PM  Result Value Ref Range   Glucose 104 (H) 65 - 99 mg/dL   BUN 13 8 - 27 mg/dL   Creatinine, Ser 5.62 0.76 - 1.27 mg/dL   GFR calc non Af Amer 90 >59 mL/min/1.73   GFR calc Af Amer 104 >59 mL/min/1.73   BUN/Creatinine Ratio 15 10 - 24   Sodium 139 134 - 144 mmol/L   Potassium 4.6 3.5 - 5.2 mmol/L   Chloride 101 96 - 106 mmol/L   CO2 23 20 - 29 mmol/L   Calcium 10.0 8.6 - 10.2 mg/dL  Hepatic function panel     Status: Abnormal   Collection Time: 05/19/17  4:04 PM  Result Value Ref Range   Total Protein 7.3 6.0 - 8.5 g/dL   Albumin 4.7 3.6 - 4.8 g/dL   Bilirubin Total 0.9 0.0 - 1.2 mg/dL   Bilirubin, Direct 1.30 0.00 - 0.40 mg/dL   Alkaline Phosphatase 66 39 - 117 IU/L   AST 44 (H) 0 - 40 IU/L   ALT 38 0 - 44 IU/L  Lipid panel     Status: None   Collection Time: 05/19/17  4:04 PM  Result Value Ref Range   Cholesterol, Total 155 100 - 199 mg/dL   Triglycerides 865 0 - 149 mg/dL   HDL 54 >78 mg/dL   VLDL Cholesterol Cal 24 5 - 40 mg/dL   LDL Calculated 77 0 - 99 mg/dL   Chol/HDL Ratio 2.9 0.0 - 5.0 ratio    Comment:  T. Chol/HDL Ratio                                             Men  Women                               1/2 Avg.Risk  3.4    3.3                                    Avg.Risk  5.0    4.4                                2X Avg.Risk  9.6    7.1                                3X Avg.Risk 23.4   11.0     No results found.  ASSESSMENT AND PLAN:  Keith Cobb was seen today for shoulder/knee pain.  Diagnoses and all orders for this visit:  Essential hypertension -     lisinopril (PRINIVIL,ZESTRIL) 10 MG tablet; TAKE ONE TABLET BY MOUTH ONCE DAILY FOR BLOOD PRESSURE -     CBC -     Hemoglobin A1c -     Basic metabolic panel -     Hepatic function panel -     Lipid panel  Chronic joint pain -     naproxen (NAPROSYN) 500 MG tablet; Take 1 tablet (500 mg total) by mouth at bedtime. -     predniSONE (DELTASONE) 50 MG tablet; Take 1 tablet (50 mg total) by mouth daily with breakfast for 5 days. Do not take Naprosyn with this.  Bipolar 1 disorder, mixed, moderate (HCC) -     DULoxetine (CYMBALTA) 60 MG capsule; Take 1 capsule (60 mg total) by mouth 2 (two) times daily.    The patient is advised to call or return to clinic if he does not see an improvement in symptoms, or to seek the care of the closest emergency department if he worsens with the above plan.   Deliah Boston, MHS, PA-C Primary Care at [facility] 05/21/2017 6:23 PM

## 2017-05-19 NOTE — Patient Instructions (Signed)
     IF you received an x-ray today, you will receive an invoice from [facility]. Please contact [facility] at [phone] with questions or concerns regarding your invoice.   IF you received labwork today, you will receive an invoice from [facility]. Please contact [facility] at [phone] with questions or concerns regarding your invoice.   Our billing staff will not be able to assist you with questions regarding bills from these companies.  You will be contacted with the lab results as soon as they are available. The fastest way to get your results is to activate your My Chart account. Instructions are located on the last page of this paperwork. If you have not heard from us regarding the results in 2 weeks, please contact this office.     

## 2017-05-20 LAB — BASIC METABOLIC PANEL
BUN / CREAT RATIO: 15 (ref 10–24)
BUN: 13 mg/dL (ref 8–27)
CHLORIDE: 101 mmol/L (ref 96–106)
CO2: 23 mmol/L (ref 20–29)
Calcium: 10 mg/dL (ref 8.6–10.2)
Creatinine, Ser: 0.86 mg/dL (ref 0.76–1.27)
GFR calc non Af Amer: 90 mL/min/{1.73_m2} (ref >59)
GFR, EST AFRICAN AMERICAN: 104 mL/min/{1.73_m2} (ref >59)
GLUCOSE: 104 mg/dL — AB (ref 65–99)
Potassium: 4.6 mmol/L (ref 3.5–5.2)
SODIUM: 139 mmol/L (ref 134–144)

## 2017-05-20 LAB — LIPID PANEL
CHOLESTEROL TOTAL: 155 mg/dL (ref 100–199)
Chol/HDL Ratio: 2.9 ratio (ref 0.0–5.0)
HDL: 54 mg/dL (ref >39)
LDL Calculated: 77 mg/dL (ref 0–99)
TRIGLYCERIDES: 119 mg/dL (ref 0–149)
VLDL CHOLESTEROL CAL: 24 mg/dL (ref 5–40)

## 2017-05-20 LAB — HEPATIC FUNCTION PANEL
ALK PHOS: 66 IU/L (ref 39–117)
ALT: 38 IU/L (ref 0–44)
AST: 44 IU/L — ABNORMAL HIGH (ref 0–40)
Albumin: 4.7 g/dL (ref 3.6–4.8)
BILIRUBIN, DIRECT: 0.3 mg/dL (ref 0.00–0.40)
Bilirubin Total: 0.9 mg/dL (ref 0.0–1.2)
Total Protein: 7.3 g/dL (ref 6.0–8.5)

## 2017-05-20 LAB — CBC
Hematocrit: 47.2 % (ref 37.5–51.0)
Hemoglobin: 16.4 g/dL (ref 13.0–17.7)
MCH: 31.1 pg (ref 26.6–33.0)
MCHC: 34.7 g/dL (ref 31.5–35.7)
MCV: 90 fL (ref 79–97)
PLATELETS: 211 10*3/uL (ref 150–379)
RBC: 5.27 x10E6/uL (ref 4.14–5.80)
RDW: 14 % (ref 12.3–15.4)
WBC: 5.9 10*3/uL (ref 3.4–10.8)

## 2017-05-20 LAB — HEMOGLOBIN A1C
ESTIMATED AVERAGE GLUCOSE: 108 mg/dL
Hgb A1c MFr Bld: 5.4 % (ref 4.8–5.6)

## 2017-05-27 ENCOUNTER — Encounter: Payer: Self-pay | Admitting: Physician Assistant

## 2017-05-29 ENCOUNTER — Other Ambulatory Visit: Payer: Self-pay | Admitting: Physician Assistant

## 2017-05-29 DIAGNOSIS — M2550 Pain in unspecified joint: Principal | ICD-10-CM

## 2017-05-29 DIAGNOSIS — F3162 Bipolar disorder, current episode mixed, moderate: Secondary | ICD-10-CM

## 2017-05-29 DIAGNOSIS — G8929 Other chronic pain: Secondary | ICD-10-CM

## 2017-08-10 ENCOUNTER — Ambulatory Visit: Payer: Commercial Managed Care - PPO | Admitting: Physician Assistant

## 2017-08-10 ENCOUNTER — Other Ambulatory Visit: Payer: Self-pay | Admitting: Physician Assistant

## 2017-08-10 DIAGNOSIS — F3162 Bipolar disorder, current episode mixed, moderate: Secondary | ICD-10-CM

## 2017-08-10 DIAGNOSIS — M2550 Pain in unspecified joint: Principal | ICD-10-CM

## 2017-08-10 DIAGNOSIS — G8929 Other chronic pain: Secondary | ICD-10-CM

## 2017-08-10 MED ORDER — NAPROXEN 500 MG PO TABS: 500.0000 mg | ORAL_TABLET | Freq: Every day | ORAL | 0 refills | Status: DC

## 2017-08-10 MED ORDER — DULOXETINE HCL 60 MG PO CPEP: 60.0000 mg | ORAL_CAPSULE | Freq: Two times a day (BID) | ORAL | 3 refills | Status: DC

## 2017-08-23 ENCOUNTER — Ambulatory Visit (INDEPENDENT_AMBULATORY_CARE_PROVIDER_SITE_OTHER): Payer: Medicare Other | Admitting: Physician Assistant

## 2017-08-23 ENCOUNTER — Other Ambulatory Visit: Payer: Self-pay

## 2017-08-23 ENCOUNTER — Encounter: Payer: Self-pay | Admitting: Physician Assistant

## 2017-08-23 VITALS — BP 129/80 | HR 89 | Temp 97.4°F | Resp 16 | Ht 68.6 in | Wt 215.6 lb

## 2017-08-23 DIAGNOSIS — M2550 Pain in unspecified joint: Secondary | ICD-10-CM | POA: Diagnosis not present

## 2017-08-23 DIAGNOSIS — M19012 Primary osteoarthritis, left shoulder: Secondary | ICD-10-CM

## 2017-08-23 DIAGNOSIS — G8929 Other chronic pain: Secondary | ICD-10-CM

## 2017-08-23 MED ORDER — NAPROXEN 500 MG PO TABS: 500.0000 mg | ORAL_TABLET | Freq: Every day | ORAL | 1 refills | Status: DC

## 2017-08-23 NOTE — Patient Instructions (Addendum)
    Set up an appointment with Dr. Neva SeatGreene in two months for a establish care and follow up.    I will be at [address] [place] and would be thrilled to see you over there.     I will contact you with your lab results within the next 2 weeks.  If you have not heard from us then please contact us. The fastest way to get your results is to register for My Chart.   IF you received an x-ray today, you will receive an invoice from [facility]. Please contact [facility] at [phone] with questions or concerns regarding your invoice.   IF you received labwork today, you will receive an invoice from [facility]. Please contact [facility] at [phone] with questions or concerns regarding your invoice.   Our billing staff will not be able to assist you with questions regarding bills from these companies.  You will be contacted with the lab results as soon as they are available. The fastest way to get your results is to activate your My Chart account. Instructions are located on the last page of this paperwork. If you have not heard from us regarding the results in 2 weeks, please contact this office.

## 2017-08-23 NOTE — Progress Notes (Signed)
08/23/2017 2:07 PM   DOB: 07/17/1950 / MRN: [id number]  SUBJECTIVE:  Keith Cobb is a 67 y.o. male presenting for left shoulder pain. This is not a new symptom. He has been seen by Dr. August Saucerean and knows he will need surgery eventually.  The problem is worsening steadily. He has tried gabapentin, tylenol, flexeril, Aleve, prednisone, all of which help temporarily. At the current time he takes aleve as needed and is getting by okay.  He would like to know who to see for his care once I have left [facility].  I have advised that he see Dr. Neva SeatGreene.   He is allergic to bee venom.   He  has a past medical history of Anxiety, Arthritis, Depression, and Hypertension.    He  reports that he has been smoking cigarettes. He has a 9.75 pack-year smoking history. He has never used smokeless tobacco. He reports that he drinks about 12.0 standard drinks of alcohol per week. He reports that he does not use drugs. He  has no sexual activity history on file. The patient  has no past surgical history on file.  His family history includes Stroke in his father.  Review of Systems  Constitutional: Negative for chills, diaphoresis and fever.  Respiratory: Negative for cough, hemoptysis, sputum production, shortness of breath and wheezing.   Cardiovascular: Negative for chest pain, orthopnea and leg swelling.  Gastrointestinal: Negative for nausea.  Musculoskeletal: Positive for joint pain and myalgias. Negative for back pain, falls and neck pain.  Skin: Negative for rash.  Neurological: Negative for dizziness.    The problem list and medications were reviewed and updated by myself where necessary and exist elsewhere in the encounter.   OBJECTIVE:  BP 129/80   Pulse 89   Temp (!) 97.4 F (36.3 C)   Resp 16   Ht 5' 8.6" (1.742 m)   Wt 215 lb 9.6 oz (97.8 kg)   SpO2 96%   BMI 32.21 kg/m   Wt Readings from Last 3 Encounters:  08/23/17 215 lb 9.6 oz (97.8 kg)  05/19/17 217 lb 9.6 oz (98.7 kg)  01/16/17  216 lb 12.8 oz (98.3 kg)   Temp Readings from Last 3 Encounters:  08/23/17 (!) 97.4 F (36.3 C)  05/19/17 97.7 F (36.5 C) (Oral)  01/16/17 (!) 97.5 F (36.4 C) (Oral)   BP Readings from Last 3 Encounters:  08/23/17 129/80  05/19/17 124/80  01/16/17 130/82   Pulse Readings from Last 3 Encounters:  08/23/17 89  05/19/17 91  01/16/17 87    Physical Exam  Constitutional: He is oriented to person, place, and time. He appears well-developed. He does not appear ill.  Eyes: Pupils are equal, round, and reactive to light. Conjunctivae and EOM are normal.  Cardiovascular: Normal rate.  Pulmonary/Chest: Effort normal.  Abdominal: He exhibits no distension.  Musculoskeletal: Normal range of motion.       Arms: Neurological: He is alert and oriented to person, place, and time. No cranial nerve deficit. Coordination normal.  Skin: Skin is warm and dry. He is not diaphoretic.  Psychiatric: He has a normal mood and affect.  Nursing note and vitals reviewed.   Lab Results  Component Value Date   HGBA1C 5.4 05/19/2017    Lab Results  Component Value Date   WBC 5.9 05/19/2017   HGB 16.4 05/19/2017   HCT 47.2 05/19/2017   MCV 90 05/19/2017   PLT 211 05/19/2017    Lab Results  Component Value  Date   CREATININE 0.86 05/19/2017   BUN 13 05/19/2017   NA 139 05/19/2017   K 4.6 05/19/2017   CL 101 05/19/2017   CO2 23 05/19/2017    Lab Results  Component Value Date   ALT 38 05/19/2017   AST 44 (H) 05/19/2017   ALKPHOS 66 05/19/2017   BILITOT 0.9 05/19/2017    Lab Results  Component Value Date   TSH 1.490 05/09/2016    Lab Results  Component Value Date   CHOL 155 05/19/2017   HDL 54 05/19/2017   LDLCALC 77 05/19/2017   TRIG 119 05/19/2017   CHOLHDL 2.9 05/19/2017     ASSESSMENT AND PLAN:  Keith Cobb was seen today for medication refill.  Diagnoses and all orders for this visit:  Primary osteoarthritis of left shoulder: Overall patient is in good health.  He  drinks too much and knows this and he has no plan to stop.  He is a very mild hypertensive that has been well controlled on a low dose of lisinopril. I have tried to get him to go for a colonoscopy and lung ct for his history of smoking however he never went to these appointments.    Chronic joint pain -     naproxen (NAPROSYN) 500 MG tablet; Take 1 tablet (500 mg total) by mouth at bedtime.    The patient is advised to call or return to clinic if he does not see an improvement in symptoms, or to seek the care of the closest emergency department if he worsens with the above plan.   Deliah BostonMichael Clark, MHS, PA-C Primary Care at [facility] 08/23/2017 2:07 PM

## 2017-10-25 ENCOUNTER — Other Ambulatory Visit: Payer: Self-pay

## 2017-10-25 ENCOUNTER — Ambulatory Visit (INDEPENDENT_AMBULATORY_CARE_PROVIDER_SITE_OTHER): Payer: Medicare Other | Admitting: Family Medicine

## 2017-10-25 ENCOUNTER — Encounter: Payer: Self-pay | Admitting: Family Medicine

## 2017-10-25 VITALS — BP 138/84 | HR 76 | Temp 97.5°F | Ht 70.0 in | Wt 216.0 lb

## 2017-10-25 DIAGNOSIS — Z23 Encounter for immunization: Secondary | ICD-10-CM | POA: Diagnosis not present

## 2017-10-25 DIAGNOSIS — Z9181 History of falling: Secondary | ICD-10-CM

## 2017-10-25 DIAGNOSIS — I10 Essential (primary) hypertension: Secondary | ICD-10-CM | POA: Diagnosis not present

## 2017-10-25 DIAGNOSIS — F514 Sleep terrors [night terrors]: Secondary | ICD-10-CM

## 2017-10-25 DIAGNOSIS — F1010 Alcohol abuse, uncomplicated: Secondary | ICD-10-CM

## 2017-10-25 DIAGNOSIS — F3162 Bipolar disorder, current episode mixed, moderate: Secondary | ICD-10-CM | POA: Diagnosis not present

## 2017-10-25 MED ORDER — DULOXETINE HCL 60 MG PO CPEP: 60.0000 mg | ORAL_CAPSULE | Freq: Two times a day (BID) | ORAL | 1 refills | Status: DC

## 2017-10-25 MED ORDER — LISINOPRIL 10 MG PO TABS
ORAL_TABLET | ORAL | 1 refills | Status: DC
Start: 2017-10-25 — End: 2018-05-01

## 2017-10-25 NOTE — Patient Instructions (Addendum)
Mood swings can be due to multiple causes. I would recommend cutting back on alcohol to begin with, continue Cymbalta for now. We can discuss this further in 6 weeks. Return to the clinic or go to the nearest emergency room if any of your symptoms worsen or new symptoms occur.  No change in lisinopril dose for now.   Please follow up in next 6-8 weeks to discuss your knee issues further, but if any new weakness - be seen in emergency room.   I will refer you to sleep specialist to discuss the sleep issues and falling out of bed.   Return to the clinic or go to the nearest emergency room if any of your symptoms worsen or new symptoms occur.   I recommend cutting back on alcohol. If you have difficulty cutting back, here are a few resources: [facility]  Address: [address], [place], [place] [address]  Phone: [phone]  [facility]  Address: [address] [place], [place] [address]  Phone: [phone]  [facility] Address: [address] [place], [place], [place] [address]  Phone: [phone]  Let me know if you are ready to quit smoking, and if I can provide any assistance.    Steps to Quit Smoking Smoking tobacco can be harmful to your health and can affect almost every organ in your body. Smoking puts you, and those around you, at risk for developing many serious chronic diseases. Quitting smoking is difficult, but it is one of the best things that you can do for your health. It is never too late to quit. What are the benefits of quitting smoking? When you quit smoking, you lower your risk of developing serious diseases and conditions, such as:  Lung cancer or lung disease, such as COPD.  Heart disease.  Stroke.  Heart attack.  Infertility.  Osteoporosis and bone fractures.  Additionally, symptoms such as coughing, wheezing, and shortness of breath may get better when you quit. You may also find that you get sick less often because your body is  stronger at fighting off colds and infections. If you are pregnant, quitting smoking can help to reduce your chances of having a baby of low birth weight. How do I get ready to quit? When you decide to quit smoking, create a plan to make sure that you are successful. Before you quit:  Pick a date to quit. Set a date within the next two weeks to give you time to prepare.  Write down the reasons why you are quitting. Keep this list in places where you will see it often, such as on your bathroom mirror or in your car or wallet.  Identify the people, places, things, and activities that make you want to smoke (triggers) and avoid them. Make sure to take these actions: ? Throw away all cigarettes at home, at work, and in your car. ? Throw away smoking accessories, such as Set designer. ? Clean your car and make sure to empty the ashtray. ? Clean your home, including curtains and carpets.  Tell your family, friends, and coworkers that you are quitting. Support from your loved ones can make quitting easier.  Talk with your health care provider about your options for quitting smoking.  Find out what treatment options are covered by your health insurance.  What strategies can I use to quit smoking? Talk with your healthcare provider about different strategies to quit smoking. Some strategies include:  Quitting smoking altogether instead  of gradually lessening how much you smoke over a period of time. Research shows that quitting "cold [place]" is more successful than gradually quitting.  Attending in-person counseling to help you build problem-solving skills. You are more likely to have success in quitting if you attend several counseling sessions. Even short sessions of 10 minutes can be effective.  Finding resources and support systems that can help you to quit smoking and remain smoke-free after you quit. These resources are most helpful when you use them often. They can  include: ? Online chats with a Veterinary surgeon. ? Telephone quitlines. ? Automotive engineer. ? Support groups or group counseling. ? Text messaging programs. ? Mobile phone applications.  Taking medicines to help you quit smoking. (If you are pregnant or breastfeeding, talk with your health care provider first.) Some medicines contain nicotine and some do not. Both types of medicines help with cravings, but the medicines that include nicotine help to relieve withdrawal symptoms. Your health care provider may recommend: ? Nicotine patches, gum, or lozenges. ? Nicotine inhalers or sprays. ? Non-nicotine medicine that is taken by mouth.  Talk with your health care provider about combining strategies, such as taking medicines while you are also receiving in-person counseling. Using these two strategies together makes you more likely to succeed in quitting than if you used either strategy on its own. If you are pregnant or breastfeeding, talk with your health care provider about finding counseling or other support strategies to quit smoking. Do not take medicine to help you quit smoking unless told to do so by your health care provider. What things can I do to make it easier to quit? Quitting smoking might feel overwhelming at first, but there is a lot that you can do to make it easier. Take these important actions:  Reach out to your family and friends and ask that they support and encourage you during this time. Call telephone quitlines, reach out to support groups, or work with a counselor for support.  Ask people who smoke to avoid smoking around you.  Avoid places that trigger you to smoke, such as bars, parties, or smoke-break areas at work.  Spend time around people who do not smoke.  Lessen stress in your life, because stress can be a smoking trigger for some people. To lessen stress, try: ? Exercising regularly. ? Deep-breathing exercises. ? Yoga. ? Meditating. ? Performing a  body scan. This involves closing your eyes, scanning your body from head to toe, and noticing which parts of your body are particularly tense. Purposefully relax the muscles in those areas.  Download or purchase mobile phone or tablet apps (applications) that can help you stick to your quit plan by providing reminders, tips, and encouragement. There are many free apps, such as QuitGuide from the [employer] developer for Disease Control and Prevention). You can find other support for quitting smoking (smoking cessation) through smokefree.gov and other websites.  How will I feel when I quit smoking? Within the first 24 hours of quitting smoking, you may start to feel some withdrawal symptoms. These symptoms are usually most noticeable 2-3 days after quitting, but they usually do not last beyond 2-3 weeks. Changes or symptoms that you might experience include:  Mood swings.  Restlessness, anxiety, or irritation.  Difficulty concentrating.  Dizziness.  Strong cravings for sugary foods in addition to nicotine.  Mild weight gain.  Constipation.  Nausea.  Coughing or a sore throat.  Changes in how your medicines work in your body.  A depressed mood.  Difficulty sleeping (insomnia).  After the first 2-3 weeks of quitting, you may start to notice more positive results, such as:  Improved sense of smell and taste.  Decreased coughing and sore throat.  Slower heart rate.  Lower blood pressure.  Clearer skin.  The ability to breathe more easily.  Fewer sick days.  Quitting smoking is very challenging for most people. Do not get discouraged if you are not successful the first time. Some people need to make many attempts to quit before they achieve long-term success. Do your best to stick to your quit plan, and talk with your health care provider if you have any questions or concerns. This information is not intended to replace advice given to you by your health care provider. Make sure you  discuss any questions you have with your health care provider. Document Released: 12/21/2000 Document Revised: 08/25/2015 Document Reviewed: 05/13/2014 Elsevier Interactive Patient Education  [employer].    If you have lab work done today you will be contacted with your lab results within the next 2 weeks.  If you have not heard from [place] then please contact us. The fastest way to get your results is to register for My Chart.   IF you received an x-ray today, you will receive an invoice from [facility]. Please contact [facility] at [phone] with questions or concerns regarding your invoice.   IF you received labwork today, you will receive an invoice from [facility]. Please contact [facility] at [phone] with questions or concerns regarding your invoice.   Our billing staff will not be able to assist you with questions regarding bills from these companies.  You will be contacted with the lab results as soon as they are available. The fastest way to get your results is to activate your My Chart account. Instructions are located on the last page of this paperwork. If you have not heard from [place] regarding the results in 2 weeks, please contact this office.

## 2017-10-25 NOTE — Progress Notes (Signed)
Subjective:  By signing my name below, I, Keith Cobb, attest that this documentation has been prepared under the direction and in the presence of [name], MD Electronically Signed: Charline Bills, ED Scribe 10/25/2017 at 1:41 PM.   Patient ID: Keith Cobb, male    DOB: [birth year], 67 y.o.   MRN: [id number]  Chief Complaint  Patient presents with  . Establish Care    former [facility] pt  . Fall    positive fall screen 1 time   HPI Keith Cobb is a 67 y.o. male who presents to Primary Care at [facility] to establish care. New pt, formerly followed by Deliah Boston, PA-C. H/o HTN, depression, chronic L shoulder pain followed by Dr. August Saucer, tobacco use and alcohol use/absue.  HTN Lab Results  Component Value Date   CREATININE 0.86 05/19/2017   BP Readings from Last 3 Encounters:  10/25/17 138/84  08/23/17 129/80  05/19/17 124/80  Lisinopril 10 mg qd. - Denies new cough, lightheadedness, dizziness, new side-effects.  Depression Treated with Cymbalta 60 mg qd. - Pt states that his wife thinks he needs to change his dose or meds. Reports anxiety attacks and mood swings with depression, states he has only had 1 this month. States "I've had a very hard life and upbringing but I've fought through it and I've made it. I can get depressed easily or I can continue to work through it with [employer] grace". Denies SI. Depression screen [facility] 2/9 10/25/2017 05/19/2017 01/16/2017 10/13/2016 09/26/2016  Decreased Interest 0 0 0 0 0  Down, Depressed, Hopeless 0 0 0 0 0  PHQ - 2 Score 0 0 0 0 0  Altered sleeping - - - - -  Tired, decreased energy - - - - -  Change in appetite - - - - -  Feeling bad or failure about yourself  - - - - -  Trouble concentrating - - - - -  Moving slowly or fidgety/restless - - - - -  Suicidal thoughts - - - - -  PHQ-9 Score - - - - -  Difficult doing work/chores - - - - -   Chronic L Shoulder Pain Intermittent Aleve at last OV with Naproxen prn.  Tobacco  Abuse Cessation discussion in the past. Recommended lung CA CT. - Smoking 1 pack per wk x 53 yrs, 7.5 pack yrs. States he is trying to cut back on smoking. He has tried Chantix and seeing a psychiatrist in the past but he has to want to quit for himself. He would like to have a lung CT done. Denies hemoptysis, unexplained weight loss.  Alcohol Use Drinks ~1 case of beer in 1 wk. States he has spent 90 days in sobriety house ~9-10 yrs ago but started relapsing the very next day. Reports he is not interested in getting help since he doesn't get drunk. States "if they take this away from me, I have nothing". He does report a strong family h/o alcoholism/alcohol use.  Denies difficulty working or functioning.  Fall Pt reports abrasions to his knees from falling out of the bed twice over the past 3 months. States he has had bizarre dreams in which he felt like "his body was picked up and thrown onto his hands and knees". Pt also reports difficulty getting up off the ground when he falls. States he has lost his balance and fallen in the bathtub in the past as well, but has had some knee issues as well as shoulder  issues for some time.  Reports that it takes a while for him to stand if he falls due to knee and shoulder pain which has gradually worsened over the yrs. Denies difficulty ambulating.   Patient Active Problem List   Diagnosis Date Noted  . Smoker 05/04/2015  . HTN (hypertension) 03/19/2012  . Depression 03/19/2012   Past Medical History:  Diagnosis Date  . Anxiety   . Arthritis   . Depression   . Hypertension    No past surgical history on file. Allergies  Allergen Reactions  . Bee Venom    Prior to Admission medications   Medication Sig Start Date End Date Taking? Authorizing Provider  DULoxetine (CYMBALTA) 60 MG capsule Take 1 capsule (60 mg total) by mouth 2 (two) times daily. 08/10/17   Ofilia Neas, PA-C  lisinopril (PRINIVIL,ZESTRIL) 10 MG tablet TAKE ONE TABLET BY MOUTH  ONCE DAILY FOR BLOOD PRESSURE 05/19/17   Ofilia Neas, PA-C  naproxen (NAPROSYN) 500 MG tablet Take 1 tablet (500 mg total) by mouth at bedtime. 08/23/17   Ofilia Neas, PA-C   Social History   Socioeconomic History  . Marital status: Married    Spouse name: Not on file  . Number of children: Not on file  . Years of education: Not on file  . Highest education level: Not on file  Occupational History  . Not on file  Social Needs  . Financial resource strain: Not on file  . Food insecurity:    Worry: Not on file    Inability: Not on file  . Transportation needs:    Medical: Not on file    Non-medical: Not on file  Tobacco Use  . Smoking status: Current Some Day Smoker    Packs/day: 0.25    Years: 39.00    Pack years: 9.75    Types: Cigarettes  . Smokeless tobacco: Never Used  Substance and Sexual Activity  . Alcohol use: Yes    Alcohol/week: 12.0 standard drinks    Types: 12 Cans of beer per week  . Drug use: No  . Sexual activity: Not on file  Lifestyle  . Physical activity:    Days per week: Not on file    Minutes per session: Not on file  . Stress: Not on file  Relationships  . Social connections:    Talks on phone: Not on file    Gets together: Not on file    Attends religious service: Not on file    Active member of club or organization: Not on file    Attends meetings of clubs or organizations: Not on file    Relationship status: Not on file  . Intimate partner violence:    Fear of current or ex partner: Not on file    Emotionally abused: Not on file    Physically abused: Not on file    Forced sexual activity: Not on file  Other Topics Concern  . Not on file  Social History Narrative  . Not on file   Review of Systems  Constitutional: Negative for fatigue and unexpected weight change.  Eyes: Negative for visual disturbance.  Respiratory: Negative for cough, chest tightness and shortness of breath.   Cardiovascular: Negative for chest pain,  palpitations and leg swelling.  Gastrointestinal: Negative for abdominal pain and blood in stool.  Musculoskeletal: Positive for arthralgias. Negative for gait problem.  Skin: Positive for wound.  Neurological: Negative for dizziness, light-headedness and headaches.  Psychiatric/Behavioral: Positive for dysphoric mood  and sleep disturbance. Negative for suicidal ideas. The patient is nervous/anxious.       Objective:   Physical Exam  Constitutional: He is oriented to person, place, and time. He appears well-developed and well-nourished.  HENT:  Head: Normocephalic and atraumatic.  Eyes: Pupils are equal, round, and reactive to light. EOM are normal.  Neck: No JVD present. Carotid bruit is not present.  Cardiovascular: Normal rate, regular rhythm and normal heart sounds.  No murmur heard. Pulmonary/Chest: Effort normal and breath sounds normal. He has no rales.  Musculoskeletal: He exhibits no edema.  Neurological: He is alert and oriented to person, place, and time.  Skin: Skin is warm and dry.  Psychiatric: He has a normal mood and affect.  Vitals reviewed. Ambulates without assistive device, no apparent focal weakness.    Vitals:   10/25/17 1326  BP: 138/84  Pulse: 76  Temp: (!) 97.5 F (36.4 C)  TempSrc: Oral  SpO2: 96%  Weight: 216 lb (98 kg)  Height: 5\' 10"  (1.778 m)      Assessment & Plan:    EDWORD CU is a 67 y.o. male Need for influenza vaccination - Plan: Flu vaccine HIGH DOSE PF (Fluzone High dose)  Essential hypertension - Plan: lisinopril (PRINIVIL,ZESTRIL) 10 MG tablet  -Stable at present, continue lisinopril same dose for now.  Pain on labs next visit  Bipolar 1 disorder, mixed, moderate (HCC) - Plan: DULoxetine (CYMBALTA) 60 MG capsule  -On review of chart, has been treated for bipolar in the past with Depakote and Cymbalta.  Due to side effects had to decrease dose of Depakote and came off of this medicine in October 2017.   -Continue Cymbalta for  now, plans to follow-up to discuss mood swings further, but reports only one in the past month.    - Potentially could consider other mood stabilizer or psychiatry eval.  Discussed potential alcohol induced mood disorder that may also contribute to symptoms.  Recommended to cut back on alcohol use, but currently drinking 1 case per week.   -  Denies suicidal ideation, plan for follow-up in the next 6 weeks to discuss mood symptoms further.    Night terrors - Plan: Ambulatory referral to Sleep Studies  -As above, potential night terrors with falling out of bed due to movement.  Evaluate with sleep specialist to determine if other testing indicated, as it appears to be more than periodic leg movement issue.  Alcohol abuse  -History of treatment the past, drinking 1 case per week at this time.  Recommended decreased use given mood symptoms above, resources provided if difficulty cutting back  History of fall  -Nonfocal exam, history of chronic knee issues and shoulder issues, plans to follow-up to discuss symptoms further but fall precautions and RTC/ER precautions were given  Meds ordered this encounter  Medications  . lisinopril (PRINIVIL,ZESTRIL) 10 MG tablet    Sig: TAKE ONE TABLET BY MOUTH ONCE DAILY FOR BLOOD PRESSURE    Dispense:  90 tablet    Refill:  1  . DULoxetine (CYMBALTA) 60 MG capsule    Sig: Take 1 capsule (60 mg total) by mouth 2 (two) times daily.    Dispense:  180 capsule    Refill:  1   Patient Instructions   Mood swings can be due to multiple causes. I would recommend cutting back on alcohol to begin with, continue Cymbalta for now. We can discuss this further in 6 weeks. Return to the clinic or go to the  nearest emergency room if any of your symptoms worsen or new symptoms occur.  No change in lisinopril dose for now.   Please follow up in next 6-8 weeks to discuss your knee issues further, but if any new weakness - be seen in emergency room.   I will refer you to  sleep specialist to discuss the sleep issues and falling out of bed.   Return to the clinic or go to the nearest emergency room if any of your symptoms worsen or new symptoms occur.   I recommend cutting back on alcohol. If you have difficulty cutting back, here are a few resources: [facility]  Address: [address] [place], [place] [address]  Phone: [phone]  [facility]  Address: [address], [place], [place] [address]  Phone: [phone]  [facility] Address: [address] [place], [place], [place] [address]  Phone: [phone]  Let me know if you are ready to quit smoking, and if I can provide any assistance.    Steps to Quit Smoking Smoking tobacco can be harmful to your health and can affect almost every organ in your body. Smoking puts you, and those around you, at risk for developing many serious chronic diseases. Quitting smoking is difficult, but it is one of the best things that you can do for your health. It is never too late to quit. What are the benefits of quitting smoking? When you quit smoking, you lower your risk of developing serious diseases and conditions, such as:  Lung cancer or lung disease, such as COPD.  Heart disease.  Stroke.  Heart attack.  Infertility.  Osteoporosis and bone fractures.  Additionally, symptoms such as coughing, wheezing, and shortness of breath may get better when you quit. You may also find that you get sick less often because your body is stronger at fighting off colds and infections. If you are pregnant, quitting smoking can help to reduce your chances of having a baby of low birth weight. How do I get ready to quit? When you decide to quit smoking, create a plan to make sure that you are successful. Before you quit:  Pick a date to quit. Set a date within the next two weeks to give you time to prepare.  Write down the reasons why you are quitting. Keep this list in places where you will see it  often, such as on your bathroom mirror or in your car or wallet.  Identify the people, places, things, and activities that make you want to smoke (triggers) and avoid them. Make sure to take these actions: ? Throw away all cigarettes at home, at work, and in your car. ? Throw away smoking accessories, such as Set designer. ? Clean your car and make sure to empty the ashtray. ? Clean your home, including curtains and carpets.  Tell your family, friends, and coworkers that you are quitting. Support from your loved ones can make quitting easier.  Talk with your health care provider about your options for quitting smoking.  Find out what treatment options are covered by your health insurance.  What strategies can I use to quit smoking? Talk with your healthcare provider about different strategies to quit smoking. Some strategies include:  Quitting smoking altogether instead of gradually lessening how much you smoke over a period of time. Research shows that quitting "cold [place]" is more successful than gradually quitting.  Attending in-person counseling to help you build problem-solving skills. You are more  likely to have success in quitting if you attend several counseling sessions. Even short sessions of 10 minutes can be effective.  Finding resources and support systems that can help you to quit smoking and remain smoke-free after you quit. These resources are most helpful when you use them often. They can include: ? Online chats with a Veterinary surgeon. ? Telephone quitlines. ? Automotive engineer. ? Support groups or group counseling. ? Text messaging programs. ? Mobile phone applications.  Taking medicines to help you quit smoking. (If you are pregnant or breastfeeding, talk with your health care provider first.) Some medicines contain nicotine and some do not. Both types of medicines help with cravings, but the medicines that include nicotine help to relieve withdrawal  symptoms. Your health care provider may recommend: ? Nicotine patches, gum, or lozenges. ? Nicotine inhalers or sprays. ? Non-nicotine medicine that is taken by mouth.  Talk with your health care provider about combining strategies, such as taking medicines while you are also receiving in-person counseling. Using these two strategies together makes you more likely to succeed in quitting than if you used either strategy on its own. If you are pregnant or breastfeeding, talk with your health care provider about finding counseling or other support strategies to quit smoking. Do not take medicine to help you quit smoking unless told to do so by your health care provider. What things can I do to make it easier to quit? Quitting smoking might feel overwhelming at first, but there is a lot that you can do to make it easier. Take these important actions:  Reach out to your family and friends and ask that they support and encourage you during this time. Call telephone quitlines, reach out to support groups, or work with a counselor for support.  Ask people who smoke to avoid smoking around you.  Avoid places that trigger you to smoke, such as bars, parties, or smoke-break areas at work.  Spend time around people who do not smoke.  Lessen stress in your life, because stress can be a smoking trigger for some people. To lessen stress, try: ? Exercising regularly. ? Deep-breathing exercises. ? Yoga. ? Meditating. ? Performing a body scan. This involves closing your eyes, scanning your body from head to toe, and noticing which parts of your body are particularly tense. Purposefully relax the muscles in those areas.  Download or purchase mobile phone or tablet apps (applications) that can help you stick to your quit plan by providing reminders, tips, and encouragement. There are many free apps, such as QuitGuide from the [employer] developer for Disease Control and Prevention). You can find other support for  quitting smoking (smoking cessation) through smokefree.gov and other websites.  How will I feel when I quit smoking? Within the first 24 hours of quitting smoking, you may start to feel some withdrawal symptoms. These symptoms are usually most noticeable 2-3 days after quitting, but they usually do not last beyond 2-3 weeks. Changes or symptoms that you might experience include:  Mood swings.  Restlessness, anxiety, or irritation.  Difficulty concentrating.  Dizziness.  Strong cravings for sugary foods in addition to nicotine.  Mild weight gain.  Constipation.  Nausea.  Coughing or a sore throat.  Changes in how your medicines work in your body.  A depressed mood.  Difficulty sleeping (insomnia).  After the first 2-3 weeks of quitting, you may start to notice more positive results, such as:  Improved sense of smell and taste.  Decreased coughing and  sore throat.  Slower heart rate.  Lower blood pressure.  Clearer skin.  The ability to breathe more easily.  Fewer sick days.  Quitting smoking is very challenging for most people. Do not get discouraged if you are not successful the first time. Some people need to make many attempts to quit before they achieve long-term success. Do your best to stick to your quit plan, and talk with your health care provider if you have any questions or concerns. This information is not intended to replace advice given to you by your health care provider. Make sure you discuss any questions you have with your health care provider. Document Released: 12/21/2000 Document Revised: 08/25/2015 Document Reviewed: 05/13/2014 Elsevier Interactive Patient Education  [employer].    If you have lab work done today you will be contacted with your lab results within the next 2 weeks.  If you have not heard from [place] then please contact us. The fastest way to get your results is to register for My Chart.   IF you received an x-ray today,  you will receive an invoice from [facility]. Please contact [facility] at [phone] with questions or concerns regarding your invoice.   IF you received labwork today, you will receive an invoice from [facility]. Please contact [facility] at [phone] with questions or concerns regarding your invoice.   Our billing staff will not be able to assist you with questions regarding bills from these companies.  You will be contacted with the lab results as soon as they are available. The fastest way to get your results is to activate your My Chart account. Instructions are located on the last page of this paperwork. If you have not heard from [place] regarding the results in 2 weeks, please contact this office.       I personally performed the services described in this documentation, which was scribed in my presence. The recorded information has been reviewed and considered for accuracy and completeness, addended by me as needed, and agree with information above.  Signed,   Meredith Staggers, MD Primary Care at [facility].  10/25/17 3:58 PM

## 2017-12-12 ENCOUNTER — Ambulatory Visit: Payer: Medicare Other | Admitting: Family Medicine

## 2017-12-18 ENCOUNTER — Institutional Professional Consult (permissible substitution): Payer: Medicare Other | Admitting: Neurology

## 2017-12-27 ENCOUNTER — Ambulatory Visit: Payer: Medicare Other | Admitting: Family Medicine

## 2018-04-25 ENCOUNTER — Telehealth: Payer: Self-pay | Admitting: Registered Nurse

## 2018-04-25 NOTE — Telephone Encounter (Signed)
I called Keith Cobb to discuss the need for a Medicare Annual Wellness Visit. When trying to reach his mobile number, the call went straight to the voicemail box, which was full. I will attempt to call his home number at this time.

## 2018-05-01 ENCOUNTER — Other Ambulatory Visit: Payer: Self-pay | Admitting: Family Medicine

## 2018-05-01 ENCOUNTER — Other Ambulatory Visit: Payer: Self-pay | Admitting: Physician Assistant

## 2018-05-01 DIAGNOSIS — F3162 Bipolar disorder, current episode mixed, moderate: Secondary | ICD-10-CM

## 2018-05-01 DIAGNOSIS — I10 Essential (primary) hypertension: Secondary | ICD-10-CM

## 2018-05-01 DIAGNOSIS — M2550 Pain in unspecified joint: Principal | ICD-10-CM

## 2018-05-01 DIAGNOSIS — G8929 Other chronic pain: Secondary | ICD-10-CM

## 2018-05-01 NOTE — Telephone Encounter (Signed)
Requested medication (s) are due for refill today: yes  Requested medication (s) are on the active medication list: yes  Last refill:  10/25/17 #90 with 1 refill  Future visit scheduled: No  Notes to clinic:  Unable to fill per protocol. No future appt scheduled and no recent labs.     Requested Prescriptions  Pending Prescriptions Disp Refills   lisinopril (ZESTRIL) 10 MG tablet [Pharmacy Med Name: LISINOPRIL 10 MG TABLET] 90 tablet 1    Sig: TAKE 1 TABLET BY MOUTH ONCE DAILY FOR BLOOD PRESSURE     Cardiovascular:  ACE Inhibitors Failed - 05/01/2018 11:55 AM      Failed - Cr in normal range and within 180 days    Creat  Date Value Ref Range Status  05/04/2015 0.82 0.70 - 1.25 mg/dL Final   Creatinine, Ser  Date Value Ref Range Status  05/19/2017 0.86 0.76 - 1.27 mg/dL Final         Failed - K in normal range and within 180 days    Potassium  Date Value Ref Range Status  05/19/2017 4.6 3.5 - 5.2 mmol/L Final         Failed - Valid encounter within last 6 months    Recent Outpatient Visits          6 months ago Need for influenza vaccination   [facility], Asencion Partridge, MD   8 months ago Primary osteoarthritis of left shoulder   [facility], Marolyn Hammock, PA-C   11 months ago Essential hypertension   [facility], Marolyn Hammock, PA-C   1 year ago Chronic joint pain   [facility], Marolyn Hammock, PA-C   1 year ago Bilateral shoulder region arthritis   [facility] at [facility], Marolyn Hammock, PA-C             Passed - Patient is not pregnant      Passed - Last BP in normal range    BP Readings from Last 1 Encounters:  10/25/17 138/84        DULoxetine (CYMBALTA) 60 MG capsule [Pharmacy Med Name: DULOXETINE HCL DR 60 MG CAP] 180 capsule 1    Sig: TAKE 1 CAPSULE (60 MG TOTAL) BY MOUTH 2 TIMES DAILY.     Psychiatry: Antidepressants - SNRI Failed - 05/01/2018 11:55 AM      Failed - Valid encounter within last  6 months    Recent Outpatient Visits          6 months ago Need for influenza vaccination   [facility] at [facility], Asencion Partridge, MD   8 months ago Primary osteoarthritis of left shoulder   [facility] at [facility], Marolyn Hammock, PA-C   11 months ago Essential hypertension   [facility], Marolyn Hammock, PA-C   1 year ago Chronic joint pain   [facility], Marolyn Hammock, PA-C   1 year ago Bilateral shoulder region arthritis   Primary Care at [facility], Marolyn Hammock, PA-C             Passed - Completed PHQ-2 or PHQ-9 in the last 360 days.      Passed - Last BP in normal range    BP Readings from Last 1 Encounters:  10/25/17 138/84

## 2018-05-01 NOTE — Telephone Encounter (Signed)
Due for office visit - 90 day supply granted - please schedule follow up.

## 2018-05-15 ENCOUNTER — Telehealth: Payer: Self-pay

## 2018-05-15 ENCOUNTER — Encounter: Payer: Self-pay | Admitting: Family Medicine

## 2018-05-15 NOTE — Telephone Encounter (Signed)
Hello,   Please reach out to patient to schedule his CPE.   Thank you,  Doyne Keel

## 2018-06-19 ENCOUNTER — Encounter: Payer: Self-pay | Admitting: Family Medicine

## 2018-07-03 ENCOUNTER — Encounter: Payer: Medicare Other | Admitting: Family Medicine

## 2018-07-16 ENCOUNTER — Ambulatory Visit (INDEPENDENT_AMBULATORY_CARE_PROVIDER_SITE_OTHER): Payer: Medicare Other | Admitting: Family Medicine

## 2018-07-16 ENCOUNTER — Other Ambulatory Visit: Payer: Self-pay

## 2018-07-16 ENCOUNTER — Encounter: Payer: Self-pay | Admitting: Family Medicine

## 2018-07-16 VITALS — BP 138/82 | HR 98 | Temp 98.8°F | Resp 16 | Ht 67.72 in | Wt 204.0 lb

## 2018-07-16 DIAGNOSIS — I10 Essential (primary) hypertension: Secondary | ICD-10-CM

## 2018-07-16 DIAGNOSIS — R739 Hyperglycemia, unspecified: Secondary | ICD-10-CM | POA: Diagnosis not present

## 2018-07-16 DIAGNOSIS — F3162 Bipolar disorder, current episode mixed, moderate: Secondary | ICD-10-CM

## 2018-07-16 DIAGNOSIS — Z0001 Encounter for general adult medical examination with abnormal findings: Secondary | ICD-10-CM | POA: Diagnosis not present

## 2018-07-16 DIAGNOSIS — Z1211 Encounter for screening for malignant neoplasm of colon: Secondary | ICD-10-CM

## 2018-07-16 DIAGNOSIS — Z0000 Encounter for general adult medical examination without abnormal findings: Secondary | ICD-10-CM

## 2018-07-16 DIAGNOSIS — Z23 Encounter for immunization: Secondary | ICD-10-CM | POA: Diagnosis not present

## 2018-07-16 DIAGNOSIS — Z1329 Encounter for screening for other suspected endocrine disorder: Secondary | ICD-10-CM | POA: Diagnosis not present

## 2018-07-16 DIAGNOSIS — Z131 Encounter for screening for diabetes mellitus: Secondary | ICD-10-CM | POA: Diagnosis not present

## 2018-07-16 DIAGNOSIS — F1010 Alcohol abuse, uncomplicated: Secondary | ICD-10-CM

## 2018-07-16 NOTE — Patient Instructions (Addendum)
I am concerned about your depression and possible bipolar disorder as current medicine may not be the right one for your symptoms.   You may need to be on a mood stabilizer, but that would need to be discussed with psychiatrist. Let me know when I can refer you to a psychiatrist.  Alcohol abuse is also likely affecting your mood swings and symptoms. I recommend cutting back on alcohol, and here are resources: [facility]  Address: [address] [place], [place] [address]  Phone: [phone]  [facility]  Address: [address] [place], [place] [address]  Phone: [phone]  [facility] Address: [place], [place], [place] [address]  Phone: [phone]  Option for counseling:  [facility]: [phone]  Call and schedule dentist and eye doctor appointments. Let me know if referral is needed.   Walking is ok and if around other people, should wear mask.   I recommend wearing hearing aids. Follow up with audiologist if needed to adjust settings.   Recheck in 1 month to discuss mood symptoms further.  Thanks for coming in today.    Preventive Care 55 Years and Older, Male Preventive care refers to lifestyle choices and visits with your health care provider that can promote health and wellness. This includes:  A yearly physical exam. This is also called an annual well check.  Regular dental and eye exams.  Immunizations.  Screening for certain conditions.  Healthy lifestyle choices, such as diet and exercise. What can I expect for my preventive care visit? Physical exam Your health care provider will check:  Height and weight. These may be used to calculate body mass index (BMI), which is a measurement that tells if you are at a healthy weight.  Heart rate and blood pressure.  Your skin for abnormal spots. Counseling Your health care provider may ask you questions about:  Alcohol, tobacco, and drug  use.  Emotional well-being.  Home and relationship well-being.  Sexual activity.  Eating habits.  History of falls.  Memory and ability to understand (cognition).  Work and work Statistician. What immunizations do I need?  Influenza (flu) vaccine  This is recommended every year. Tetanus, diphtheria, and pertussis (Tdap) vaccine  You may need a Td booster every 10 years. Varicella (chickenpox) vaccine  You may need this vaccine if you have not already been vaccinated. Zoster (shingles) vaccine  You may need this after age 76. Pneumococcal conjugate (PCV13) vaccine  One dose is recommended after age 60. Pneumococcal polysaccharide (PPSV23) vaccine  One dose is recommended after age 27. Measles, mumps, and rubella (MMR) vaccine  You may need at least one dose of MMR if you were born in 1957 or later. You may also need a second dose. Meningococcal conjugate (MenACWY) vaccine  You may need this if you have certain conditions. Hepatitis A vaccine  You may need this if you have certain conditions or if you travel or work in places where you may be exposed to hepatitis A. Hepatitis B vaccine  You may need this if you have certain conditions or if you travel or work in places where you may be exposed to hepatitis B. Haemophilus influenzae type b (Hib) vaccine  You may need this if you have certain conditions. You may receive vaccines as individual doses or as more than one vaccine together in one shot (combination vaccines). Talk with your health care provider about the risks and benefits of combination vaccines. What tests do  I need? Blood tests  Lipid and cholesterol levels. These may be checked every 5 years, or more frequently depending on your overall health.  Hepatitis C test.  Hepatitis B test. Screening  Lung cancer screening. You may have this screening every year starting at age 72 if you have a 30-pack-year history of smoking and currently smoke or have  quit within the past 15 years.  Colorectal cancer screening. All adults should have this screening starting at age 61 and continuing until age 54. Your health care provider may recommend screening at age 2 if you are at increased risk. You will have tests every 1-10 years, depending on your results and the type of screening test.  Prostate cancer screening. Recommendations will vary depending on your family history and other risks.  Diabetes screening. This is done by checking your blood sugar (glucose) after you have not eaten for a while (fasting). You may have this done every 1-3 years.  Abdominal aortic aneurysm (AAA) screening. You may need this if you are a current or former smoker.  Sexually transmitted disease (STD) testing. Follow these instructions at home: Eating and drinking  Eat a diet that includes fresh fruits and vegetables, whole grains, lean protein, and low-fat dairy products. Limit your intake of foods with high amounts of sugar, saturated fats, and salt.  Take vitamin and mineral supplements as recommended by your health care provider.  Do not drink alcohol if your health care provider tells you not to drink.  If you drink alcohol: ? Limit how much you have to 0-2 drinks a day. ? Be aware of how much alcohol is in your drink. In the [place] one drink equals one 12 oz bottle of beer (355 mL), one 5 oz glass of wine (148 mL), or one 1 oz glass of hard liquor (44 mL). Lifestyle  Take daily care of your teeth and gums.  Stay active. Exercise for at least 30 minutes on 5 or more days each week.  Do not use any products that contain nicotine or tobacco, such as cigarettes, e-cigarettes, and chewing tobacco. If you need help quitting, ask your health care provider.  If you are sexually active, practice safe sex. Use a condom or other form of protection to prevent STIs (sexually transmitted infections).  Talk with your health care provider about taking a low-dose aspirin  or statin. What's next?  Visit your health care provider once a year for a well check visit.  Ask your health care provider how often you should have your eyes and teeth checked.  Stay up to date on all vaccines. This information is not intended to replace advice given to you by your health care provider. Make sure you discuss any questions you have with your health care provider. Document Released: 01/23/2015 Document Revised: 12/21/2017 Document Reviewed: 12/21/2017 Elsevier Patient Education  2020 [employer].    Steps to Quit Smoking Smoking tobacco is the leading cause of preventable death. It can affect almost every organ in the body. Smoking puts you and those around you at risk for developing many serious chronic diseases. Quitting smoking can be difficult, but it is one of the best things that you can do for your health. It is never too late to quit. How do I get ready to quit? When you decide to quit smoking, create a plan to help you succeed. Before you quit:  Pick a date to quit. Set a date within the next 2 weeks to give you time  to prepare.  Write down the reasons why you are quitting. Keep this list in places where you will see it often.  Tell your family, friends, and co-workers that you are quitting. Support from your loved ones can make quitting easier.  Talk with your health care provider about your options for quitting smoking.  Find out what treatment options are covered by your health insurance.  Identify people, places, things, and activities that make you want to smoke (triggers). Avoid them. What first steps can I take to quit smoking?  Throw away all cigarettes at home, at work, and in your car.  Throw away smoking accessories, such as Scientist, research (medical).  Clean your car. Make sure to empty the ashtray.  Clean your home, including curtains and carpets. What strategies can I use to quit smoking? Talk with your health care provider about combining  strategies, such as taking medicines while you are also receiving in-person counseling. Using these two strategies together makes you more likely to succeed in quitting than if you used either strategy on its own.  If you are pregnant or breastfeeding, talk with your health care provider about finding counseling or other support strategies to quit smoking. Do not take medicine to help you quit smoking unless your health care provider tells you to do so. To quit smoking: Quit right away  Quit smoking completely, instead of gradually reducing how much you smoke over a period of time. Research shows that stopping smoking right away is more successful than gradually quitting.  Attend in-person counseling to help you build problem-solving skills. You are more likely to succeed in quitting if you attend counseling sessions regularly. Even short sessions of 10 minutes can be effective. Take medicine You may take medicines to help you quit smoking. Some medicines require a prescription and some you can purchase over-the-counter. Medicines may have nicotine in them to replace the nicotine in cigarettes. Medicines may:  Help to stop cravings.  Help to relieve withdrawal symptoms. Your health care provider may recommend:  Nicotine patches, gum, or lozenges.  Nicotine inhalers or sprays.  Non-nicotine medicine that is taken by mouth. Find resources Find resources and support systems that can help you to quit smoking and remain smoke-free after you quit. These resources are most helpful when you use them often. They include:  Online chats with a Social worker.  Telephone quitlines.  Printed Furniture conservator/restorer.  Support groups or group counseling.  Text messaging programs.  Mobile phone apps or applications. Use apps that can help you stick to your quit plan by providing reminders, tips, and encouragement. There are many free apps for mobile devices as well as websites. Examples include Quit Guide  from the [employer] and smokefree.gov What things can I do to make it easier to quit?   Reach out to your family and friends for support and encouragement. Call telephone quitlines (1-800-QUIT-NOW), reach out to support groups, or work with a counselor for support.  Ask people who smoke to avoid smoking around you.  Avoid places that trigger you to smoke, such as bars, parties, or smoke-break areas at work.  Spend time with people who do not smoke.  Lessen the stress in your life. Stress can be a smoking trigger for some people. To lessen stress, try: ? Exercising regularly. ? Doing deep-breathing exercises. ? Doing yoga. ? Meditating. ? Performing a body scan. This involves closing your eyes, scanning your body from head to toe, and noticing which parts of your body are  particularly tense. Try to relax the muscles in those areas. How will I feel when I quit smoking? Day 1 to 3 weeks Within the first 24 hours of quitting smoking, you may start to feel withdrawal symptoms. These symptoms are usually most noticeable 2-3 days after quitting, but they usually do not last for more than 2-3 weeks. You may experience these symptoms:  Mood swings.  Restlessness, anxiety, or irritability.  Trouble concentrating.  Dizziness.  Strong cravings for sugary foods and nicotine.  Mild weight gain.  Constipation.  Nausea.  Coughing or a sore throat.  Changes in how the medicines that you take for unrelated issues work in your body.  Depression.  Trouble sleeping (insomnia). Week 3 and afterward After the first 2-3 weeks of quitting, you may start to notice more positive results, such as:  Improved sense of smell and taste.  Decreased coughing and sore throat.  Slower heart rate.  Lower blood pressure.  Clearer skin.  The ability to breathe more easily.  Fewer sick days. Quitting smoking can be very challenging. Do not get discouraged if you are not successful the first time. Some  people need to make many attempts to quit before they achieve long-term success. Do your best to stick to your quit plan, and talk with your health care provider if you have any questions or concerns. Summary  Smoking tobacco is the leading cause of preventable death. Quitting smoking is one of the best things that you can do for your health.  When you decide to quit smoking, create a plan to help you succeed.  Quit smoking right away, not slowly over a period of time.  When you start quitting, seek help from your health care provider, family, or friends. This information is not intended to replace advice given to you by your health care provider. Make sure you discuss any questions you have with your health care provider. Document Released: 12/21/2000 Document Revised: 03/16/2018 Document Reviewed: 03/17/2018 Elsevier Patient Education  [employer].       If you have lab work done today you will be contacted with your lab results within the next 2 weeks.  If you have not heard from [place] then please contact us. The fastest way to get your results is to register for My Chart.   IF you received an x-ray today, you will receive an invoice from [facility]. Please contact [facility] at [phone] with questions or concerns regarding your invoice.   IF you received labwork today, you will receive an invoice from [facility]. Please contact [facility] at [phone] with questions or concerns regarding your invoice.   Our billing staff will not be able to assist you with questions regarding bills from these companies.  You will be contacted with the lab results as soon as they are available. The fastest way to get your results is to activate your My Chart account. Instructions are located on the last page of this paperwork. If you have not heard from [place] regarding the results in 2 weeks, please contact this office.

## 2018-07-16 NOTE — Progress Notes (Signed)
Subjective:    Patient ID: Keith Cobb, male    DOB: [birth year], 68 y.o.   MRN: [id number]  HPI Keith Cobb is a 68 y.o. male Presents today for: Chief Complaint  Patient presents with   Medicare Wellness   Care team: PCP - me.  Ortho [facility]  Hypertension: BP Readings from Last 3 Encounters:  07/16/18 (!) 145/85  10/25/17 138/84  08/23/17 129/80   Lab Results  Component Value Date   CREATININE 0.86 05/19/2017  on lisinopril 64m QD, rare missed dose of med, then takes next day.  Home readings: 140-150/85-92.  No chest pain/dizziness.  No blood in stool.   Depression: Bipolar by prior notes.  Refuses to see psychiatrist.  cymbalta [id number].  Some family stressors,, deaths in the family past 6 months.   Have increased symptoms.  Feels like more depressed past few months.  Some highs, then lows - tearful at times.  Here with wife. Mood swings at times.  Coped up in house. Less activity due to pandemic and heat.  Alcohol: drinking 8-9 beers per day. Has received treatment/rehab years ago.  Denies   [facility] 07/16/2018 10/25/2017 05/19/2017 01/16/2017 10/13/2016  Decreased Interest 0 0 0 0 0  Down, Depressed, Hopeless 0 0 0 0 0  PHQ - 2 Score 0 0 0 0 0  Altered sleeping - - - - -  Tired, decreased energy - - - - -  Change in appetite - - - - -  Feeling bad or failure about yourself  - - - - -  Trouble concentrating - - - - -  Moving slowly or fidgety/restless - - - - -  Suicidal thoughts - - - - -  PHQ-9 Score - - - - -  Difficult doing work/chores - - - - -   Alcohol  - as above.   Tobacco: 6-7 cigs per day. Plans to continue cut back on own.   No advanced directive - declines info.   Cancer screening: Colonoscopy: 2004. Repeat 10 years. Would like Cologuard.  PSA 03/2014 normal. Declined testing.   Immunization History  Administered Date(s) Administered   Influenza, High Dose Seasonal PF 11/03/2016, 10/25/2017   Influenza,inj,Quad  PF,6+ Mos 10/30/2012, 12/31/2014, 11/06/2015   Pneumococcal Conjugate-13 05/09/2016   Tdap 05/04/2015  pneumovax today.   Fall screen - no falls in past year.   No loose rugs. Step into basement, but living area one level.    6CIT Screen 07/16/2018  What Year? 0 points  What month? 0 points  What time? 0 points  Count back from 20 0 points  Months in reverse 0 points  Repeat phrase 0 points  Total Score 0     Functional Status Survey: Is the patient deaf or have difficulty hearing?: Yes(hearing has become worst) Does the patient have difficulty seeing, even when wearing glasses/contacts?: Yes Does the patient have difficulty concentrating, remembering, or making decisions?: No Does the patient have difficulty walking or climbing stairs?: Yes(Pt has trouble with balance) Does the patient have difficulty dressing or bathing?: No Does the patient have difficulty doing errands alone such as visiting a doctor's office or shopping?: Yes   Has hearing aids, but not wearing as sometimes concerned about the appearance. Has not met with audiologist recently.     Hearing Screening   125Hz 250Hz 500Hz 1000Hz 2000Hz 3000Hz 4000Hz 6000Hz 8000Hz  Right ear:           Left  ear:             Visual Acuity Screening   Right eye Left eye Both eyes  Without correction:     With correction: 20/20 20/20 20/20  no recent optho eval.   Dentist: few years ago.   Exercise: less since April due to pandemic.    Patient Active Problem List   Diagnosis Date Noted   Smoker 05/04/2015   HTN (hypertension) 03/19/2012   Depression 03/19/2012   Past Medical History:  Diagnosis Date   Anxiety    Arthritis    Depression    Hypertension    History reviewed. No pertinent surgical history. Allergies  Allergen Reactions   Bee Venom    Prior to Admission medications   Medication Sig Start Date End Date Taking? Authorizing Provider  DULoxetine (CYMBALTA) 60 MG capsule TAKE 1 CAPSULE  (60 MG TOTAL) BY MOUTH 2 TIMES DAILY. 05/01/18  Yes Wendie Agreste, MD  lisinopril (ZESTRIL) 10 MG tablet TAKE 1 TABLET BY MOUTH ONCE DAILY FOR BLOOD PRESSURE 05/01/18  Yes Wendie Agreste, MD   Social History   Socioeconomic History   Marital status: Married    Spouse name: Not on file   Number of children: Not on file   Years of education: Not on file   Highest education level: Not on file  Occupational History   Not on file  Social Needs   Financial resource strain: Not on file   Food insecurity    Worry: Not on file    Inability: Not on file   Transportation needs    Medical: Not on file    Non-medical: Not on file  Tobacco Use   Smoking status: Current Some Day Smoker    Packs/day: 0.25    Years: 39.00    Pack years: 9.75    Types: Cigarettes   Smokeless tobacco: Never Used  Substance and Sexual Activity   Alcohol use: Yes    Alcohol/week: 12.0 standard drinks    Types: 12 Cans of beer per week   Drug use: No   Sexual activity: Not on file  Lifestyle   Physical activity    Days per week: Not on file    Minutes per session: Not on file   Stress: Not on file  Relationships   Social connections    Talks on phone: Not on file    Gets together: Not on file    Attends religious service: Not on file    Active member of club or organization: Not on file    Attends meetings of clubs or organizations: Not on file    Relationship status: Not on file   Intimate partner violence    Fear of current or ex partner: Not on file    Emotionally abused: Not on file    Physically abused: Not on file    Forced sexual activity: Not on file  Other Topics Concern   Not on file  Social History Narrative   Not on file    Review of Systems Per HPI, otherwise 13 point ROS negative.     Objective:   Physical Exam Vitals signs reviewed.  Constitutional:      Appearance: He is well-developed.  HENT:     Head: Normocephalic and atraumatic.     Right Ear:  External ear normal.     Left Ear: External ear normal.  Eyes:     Conjunctiva/sclera: Conjunctivae normal.     Pupils: Pupils are equal,  round, and reactive to light.  Neck:     Musculoskeletal: Normal range of motion and neck supple.     Thyroid: No thyromegaly.  Cardiovascular:     Rate and Rhythm: Normal rate and regular rhythm.     Heart sounds: Normal heart sounds.  Pulmonary:     Effort: Pulmonary effort is normal. No respiratory distress.     Breath sounds: Normal breath sounds. No wheezing.  Abdominal:     General: There is no distension.     Palpations: Abdomen is soft.     Tenderness: There is no abdominal tenderness.  Musculoskeletal: Normal range of motion.        General: No tenderness.  Lymphadenopathy:     Cervical: No cervical adenopathy.  Skin:    General: Skin is warm and dry.  Neurological:     Mental Status: He is alert and oriented to person, place, and time.     Deep Tendon Reflexes: Reflexes are normal and symmetric.  Psychiatric:        Behavior: Behavior normal.    Vitals:   07/16/18 1331 07/16/18 1452  BP: (!) 145/85 138/82  Pulse: 98   Resp: 16   Temp: 98.8 F (37.1 C)   TempSrc: Oral   SpO2: 98%   Weight: 204 lb (92.5 kg)   Height: 5' 7.72" (1.72 m)        Assessment & Plan:   Keith Cobb is a 68 y.o. male Medicare annual wellness visit, subsequent  - - anticipatory guidance as below in AVS, screening labs if needed. Health maintenance items as above in HPI discussed/recommended as applicable.  - depression, fall,  functional status screening as above.. Any positive responses noted as above. Advanced directives discussed as in CHL.   -Stressed importance of wearing hearing aids, and follow-up with audiologist if difficulty hearing with aids  -Recommended follow-up with ophthalmologist/optometrist, and dentist -low intensity exercise with walking discussed using appropriate social distancing and mask to minimize exposure to  COVID   Bipolar 1 disorder, mixed, moderate ([place]) - Plan: TSH Alcohol abuse  -Reports depression in the past, but with mood swings and prior notes reviewed it does appear he has possible bipolar disorder..  Suspect this is complicated by alcohol addiction/abuse and possible substance-induced mood disorder component.  - I strongly recommended follow-up with psychiatrist for medication adjustments given continued mood swings and lack of mood stabilizer at this time, but also the importance of alcohol cessation/cutting back and resources provided for professional help.  He refused a psychiatrist referral at this time.  Cymbalta was temporarily refilled but will discuss further at follow-up visit in 1 month  Essential hypertension - Plan: Comprehensive metabolic panel, TSH  -Stable, continue lisinopril  Screening for colon cancer - Plan: Cologuard  -Limitations/risk of Cologuard were discussed including potential need for colonoscopy if positive and repeat testing interval.  Understanding expressed, order placed  Screening for thyroid disorder - Plan: TSH  Screening for diabetes mellitus - Plan: Hemoglobin A1c Hyperglycemia - Plan: Hemoglobin A1c  -Prior mild hyperglycemia, A1c looked okay.  Repeat A1c  Need for pneumococcal vaccine - Plan: Pneumococcal polysaccharide vaccine 23-valent greater than or equal to 2yo subcutaneous/IM given    No orders of the defined types were placed in this encounter.  Patient Instructions    I am concerned about your depression and possible bipolar disorder as current medicine may not be the right one for your symptoms.   You may need to be on a  mood stabilizer, but that would need to be discussed with psychiatrist. Let me know when I can refer you to a psychiatrist.  Alcohol abuse is also likely affecting your mood swings and symptoms. I recommend cutting back on alcohol, and here are resources: [facility]  Address: [address] [place], [place]  [address]  Phone: [phone]  [facility]  Address: [address], [place], [place] [address]  Phone: [phone]  [facility] Address: [place], [place], [place] [address]  Phone: [phone]  Option for counseling:  [facility]: [phone]  Call and schedule dentist and eye doctor appointments. Let me know if referral is needed.   Walking is ok and if around other people, should wear mask.   I recommend wearing hearing aids. Follow up with audiologist if needed to adjust settings.   Recheck in 1 month to discuss mood symptoms further.  Thanks for coming in today.    Preventive Care 75 Years and Older, Male Preventive care refers to lifestyle choices and visits with your health care provider that can promote health and wellness. This includes:  A yearly physical exam. This is also called an annual well check.  Regular dental and eye exams.  Immunizations.  Screening for certain conditions.  Healthy lifestyle choices, such as diet and exercise. What can I expect for my preventive care visit? Physical exam Your health care provider will check:  Height and weight. These may be used to calculate body mass index (BMI), which is a measurement that tells if you are at a healthy weight.  Heart rate and blood pressure.  Your skin for abnormal spots. Counseling Your health care provider may ask you questions about:  Alcohol, tobacco, and drug use.  Emotional well-being.  Home and relationship well-being.  Sexual activity.  Eating habits.  History of falls.  Memory and ability to understand (cognition).  Work and work Statistician. What immunizations do I need?  Influenza (flu) vaccine  This is recommended every year. Tetanus, diphtheria, and pertussis (Tdap) vaccine  You may need a Td booster every 10 years. Varicella (chickenpox) vaccine  You may need this vaccine if you have not already been  vaccinated. Zoster (shingles) vaccine  You may need this after age 77. Pneumococcal conjugate (PCV13) vaccine  One dose is recommended after age 48. Pneumococcal polysaccharide (PPSV23) vaccine  One dose is recommended after age 17. Measles, mumps, and rubella (MMR) vaccine  You may need at least one dose of MMR if you were born in 1957 or later. You may also need a second dose. Meningococcal conjugate (MenACWY) vaccine  You may need this if you have certain conditions. Hepatitis A vaccine  You may need this if you have certain conditions or if you travel or work in places where you may be exposed to hepatitis A. Hepatitis B vaccine  You may need this if you have certain conditions or if you travel or work in places where you may be exposed to hepatitis B. Haemophilus influenzae type b (Hib) vaccine  You may need this if you have certain conditions. You may receive vaccines as individual doses or as more than one vaccine together in one shot (combination vaccines). Talk with your health care provider about the risks and benefits of combination vaccines. What tests do I need? Blood tests  Lipid and cholesterol levels. These may be checked every 5 years, or more frequently depending on your overall health.  Hepatitis C test.  Hepatitis  B test. Screening  Lung cancer screening. You may have this screening every year starting at age 66 if you have a 30-pack-year history of smoking and currently smoke or have quit within the past 15 years.  Colorectal cancer screening. All adults should have this screening starting at age 57 and continuing until age 3. Your health care provider may recommend screening at age 10 if you are at increased risk. You will have tests every 1-10 years, depending on your results and the type of screening test.  Prostate cancer screening. Recommendations will vary depending on your family history and other risks.  Diabetes screening. This is done by  checking your blood sugar (glucose) after you have not eaten for a while (fasting). You may have this done every 1-3 years.  Abdominal aortic aneurysm (AAA) screening. You may need this if you are a current or former smoker.  Sexually transmitted disease (STD) testing. Follow these instructions at home: Eating and drinking  Eat a diet that includes fresh fruits and vegetables, whole grains, lean protein, and low-fat dairy products. Limit your intake of foods with high amounts of sugar, saturated fats, and salt.  Take vitamin and mineral supplements as recommended by your health care provider.  Do not drink alcohol if your health care provider tells you not to drink.  If you drink alcohol: ? Limit how much you have to 0-2 drinks a day. ? Be aware of how much alcohol is in your drink. In the [place] one drink equals one 12 oz bottle of beer (355 mL), one 5 oz glass of wine (148 mL), or one 1 oz glass of hard liquor (44 mL). Lifestyle  Take daily care of your teeth and gums.  Stay active. Exercise for at least 30 minutes on 5 or more days each week.  Do not use any products that contain nicotine or tobacco, such as cigarettes, e-cigarettes, and chewing tobacco. If you need help quitting, ask your health care provider.  If you are sexually active, practice safe sex. Use a condom or other form of protection to prevent STIs (sexually transmitted infections).  Talk with your health care provider about taking a low-dose aspirin or statin. What's next?  Visit your health care provider once a year for a well check visit.  Ask your health care provider how often you should have your eyes and teeth checked.  Stay up to date on all vaccines. This information is not intended to replace advice given to you by your health care provider. Make sure you discuss any questions you have with your health care provider. Document Released: 01/23/2015 Document Revised: 12/21/2017 Document Reviewed:  12/21/2017 Elsevier Patient Education  2020 [employer].    Steps to Quit Smoking Smoking tobacco is the leading cause of preventable death. It can affect almost every organ in the body. Smoking puts you and those around you at risk for developing many serious chronic diseases. Quitting smoking can be difficult, but it is one of the best things that you can do for your health. It is never too late to quit. How do I get ready to quit? When you decide to quit smoking, create a plan to help you succeed. Before you quit:  Pick a date to quit. Set a date within the next 2 weeks to give you time to prepare.  Write down the reasons why you are quitting. Keep this list in places where you will see it often.  Tell your family, friends, and co-workers that  you are quitting. Support from your loved ones can make quitting easier.  Talk with your health care provider about your options for quitting smoking.  Find out what treatment options are covered by your health insurance.  Identify people, places, things, and activities that make you want to smoke (triggers). Avoid them. What first steps can I take to quit smoking?  Throw away all cigarettes at home, at work, and in your car.  Throw away smoking accessories, such as Scientist, research (medical).  Clean your car. Make sure to empty the ashtray.  Clean your home, including curtains and carpets. What strategies can I use to quit smoking? Talk with your health care provider about combining strategies, such as taking medicines while you are also receiving in-person counseling. Using these two strategies together makes you more likely to succeed in quitting than if you used either strategy on its own.  If you are pregnant or breastfeeding, talk with your health care provider about finding counseling or other support strategies to quit smoking. Do not take medicine to help you quit smoking unless your health care provider tells you to do so. To quit  smoking: Quit right away  Quit smoking completely, instead of gradually reducing how much you smoke over a period of time. Research shows that stopping smoking right away is more successful than gradually quitting.  Attend in-person counseling to help you build problem-solving skills. You are more likely to succeed in quitting if you attend counseling sessions regularly. Even short sessions of 10 minutes can be effective. Take medicine You may take medicines to help you quit smoking. Some medicines require a prescription and some you can purchase over-the-counter. Medicines may have nicotine in them to replace the nicotine in cigarettes. Medicines may:  Help to stop cravings.  Help to relieve withdrawal symptoms. Your health care provider may recommend:  Nicotine patches, gum, or lozenges.  Nicotine inhalers or sprays.  Non-nicotine medicine that is taken by mouth. Find resources Find resources and support systems that can help you to quit smoking and remain smoke-free after you quit. These resources are most helpful when you use them often. They include:  Online chats with a Social worker.  Telephone quitlines.  Printed Furniture conservator/restorer.  Support groups or group counseling.  Text messaging programs.  Mobile phone apps or applications. Use apps that can help you stick to your quit plan by providing reminders, tips, and encouragement. There are many free apps for mobile devices as well as websites. Examples include Quit Guide from the [employer] and smokefree.gov What things can I do to make it easier to quit?   Reach out to your family and friends for support and encouragement. Call telephone quitlines (1-800-QUIT-NOW), reach out to support groups, or work with a counselor for support.  Ask people who smoke to avoid smoking around you.  Avoid places that trigger you to smoke, such as bars, parties, or smoke-break areas at work.  Spend time with people who do not smoke.  Lessen the  stress in your life. Stress can be a smoking trigger for some people. To lessen stress, try: ? Exercising regularly. ? Doing deep-breathing exercises. ? Doing yoga. ? Meditating. ? Performing a body scan. This involves closing your eyes, scanning your body from head to toe, and noticing which parts of your body are particularly tense. Try to relax the muscles in those areas. How will I feel when I quit smoking? Day 1 to 3 weeks Within the first 24 hours of quitting  smoking, you may start to feel withdrawal symptoms. These symptoms are usually most noticeable 2-3 days after quitting, but they usually do not last for more than 2-3 weeks. You may experience these symptoms:  Mood swings.  Restlessness, anxiety, or irritability.  Trouble concentrating.  Dizziness.  Strong cravings for sugary foods and nicotine.  Mild weight gain.  Constipation.  Nausea.  Coughing or a sore throat.  Changes in how the medicines that you take for unrelated issues work in your body.  Depression.  Trouble sleeping (insomnia). Week 3 and afterward After the first 2-3 weeks of quitting, you may start to notice more positive results, such as:  Improved sense of smell and taste.  Decreased coughing and sore throat.  Slower heart rate.  Lower blood pressure.  Clearer skin.  The ability to breathe more easily.  Fewer sick days. Quitting smoking can be very challenging. Do not get discouraged if you are not successful the first time. Some people need to make many attempts to quit before they achieve long-term success. Do your best to stick to your quit plan, and talk with your health care provider if you have any questions or concerns. Summary  Smoking tobacco is the leading cause of preventable death. Quitting smoking is one of the best things that you can do for your health.  When you decide to quit smoking, create a plan to help you succeed.  Quit smoking right away, not slowly over a  period of time.  When you start quitting, seek help from your health care provider, family, or friends. This information is not intended to replace advice given to you by your health care provider. Make sure you discuss any questions you have with your health care provider. Document Released: 12/21/2000 Document Revised: 03/16/2018 Document Reviewed: 03/17/2018 Elsevier Patient Education  [employer].       If you have lab work done today you will be contacted with your lab results within the next 2 weeks.  If you have not heard from [place] then please contact us. The fastest way to get your results is to register for My Chart.   IF you received an x-ray today, you will receive an invoice from [facility]. Please contact [facility] at [phone] with questions or concerns regarding your invoice.   IF you received labwork today, you will receive an invoice from [facility]. Please contact [facility] at [phone] with questions or concerns regarding your invoice.   Our billing staff will not be able to assist you with questions regarding bills from these companies.  You will be contacted with the lab results as soon as they are available. The fastest way to get your results is to activate your My Chart account. Instructions are located on the last page of this paperwork. If you have not heard from [place] regarding the results in 2 weeks, please contact this office.       Signed,   Merri Ray, MD [facility].  07/17/18 1:50 PM

## 2018-07-17 ENCOUNTER — Encounter: Payer: Self-pay | Admitting: Family Medicine

## 2018-07-17 LAB — HEMOGLOBIN A1C
Est. average glucose Bld gHb Est-mCnc: 111 mg/dL
Hgb A1c MFr Bld: 5.5 % (ref 4.8–5.6)

## 2018-07-17 LAB — COMPREHENSIVE METABOLIC PANEL
ALT: 39 IU/L (ref 0–44)
AST: 42 IU/L — ABNORMAL HIGH (ref 0–40)
Albumin/Globulin Ratio: 1.7 (ref 1.2–2.2)
Albumin: 4.7 g/dL (ref 3.8–4.8)
Alkaline Phosphatase: 90 IU/L (ref 39–117)
BUN/Creatinine Ratio: 13 (ref 10–24)
BUN: 10 mg/dL (ref 8–27)
Bilirubin Total: 1.4 mg/dL — ABNORMAL HIGH (ref 0.0–1.2)
CO2: 21 mmol/L (ref 20–29)
Calcium: 10.1 mg/dL (ref 8.6–10.2)
Chloride: 100 mmol/L (ref 96–106)
Creatinine, Ser: 0.79 mg/dL (ref 0.76–1.27)
GFR calc Af Amer: 107 mL/min/{1.73_m2} (ref >59)
GFR calc non Af Amer: 93 mL/min/{1.73_m2} (ref >59)
Globulin, Total: 2.7 g/dL (ref 1.5–4.5)
Glucose: 110 mg/dL — ABNORMAL HIGH (ref 65–99)
Potassium: 4.5 mmol/L (ref 3.5–5.2)
Sodium: 136 mmol/L (ref 134–144)
Total Protein: 7.4 g/dL (ref 6.0–8.5)

## 2018-07-17 LAB — TSH: TSH: 1.67 u[IU]/mL (ref 0.450–4.500)

## 2018-07-17 MED ORDER — LISINOPRIL 10 MG PO TABS: ORAL_TABLET | ORAL | 0 refills | Status: DC

## 2018-07-17 MED ORDER — DULOXETINE HCL 60 MG PO CPEP: 60.0000 mg | ORAL_CAPSULE | Freq: Two times a day (BID) | ORAL | 1 refills | Status: DC

## 2018-07-23 DIAGNOSIS — Z1211 Encounter for screening for malignant neoplasm of colon: Secondary | ICD-10-CM | POA: Diagnosis not present

## 2018-08-01 LAB — COLOGUARD: Cologuard: NEGATIVE

## 2018-10-02 ENCOUNTER — Other Ambulatory Visit: Payer: Self-pay

## 2018-10-02 ENCOUNTER — Ambulatory Visit (INDEPENDENT_AMBULATORY_CARE_PROVIDER_SITE_OTHER): Payer: Medicare Other | Admitting: Family Medicine

## 2018-10-02 DIAGNOSIS — Z23 Encounter for immunization: Secondary | ICD-10-CM | POA: Diagnosis not present

## 2018-10-02 NOTE — Progress Notes (Signed)
Nurse visit

## 2018-10-14 ENCOUNTER — Other Ambulatory Visit: Payer: Self-pay | Admitting: Family Medicine

## 2018-10-14 DIAGNOSIS — I10 Essential (primary) hypertension: Secondary | ICD-10-CM

## 2018-10-21 IMAGING — US US AORTA SCREENING (MEDICARE)
1 series · 14 of 17 positions shown · non-contrast
Comparison: None.

CLINICAL DATA: Male between 65-75 years of age with a smoking
history.

EXAM:
US ABDOMINAL AORTA MEDICARE SCREENING
TECHNIQUE: Ultrasound examination of the abdominal aorta was performed as a
screening evaluation for abdominal aortic aneurysm.

[Series 1: us aorta screening (medicare) · 0.33mm/px · 14 of 17 slices shown]
[im 1/17]
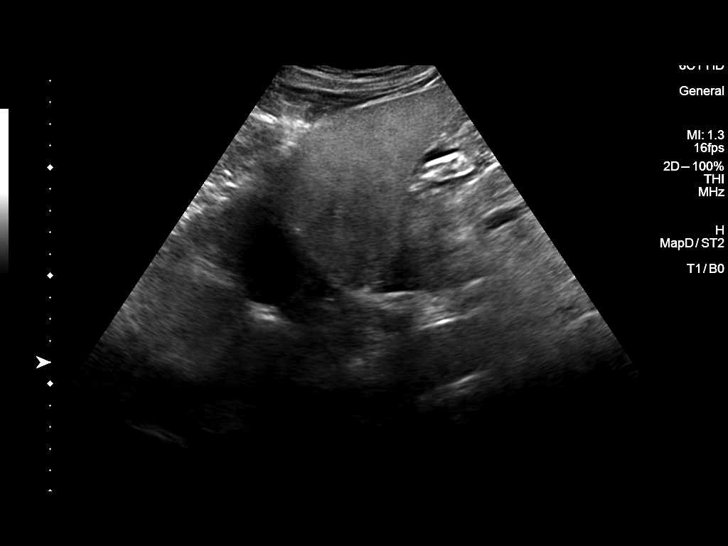
[im 2/17]
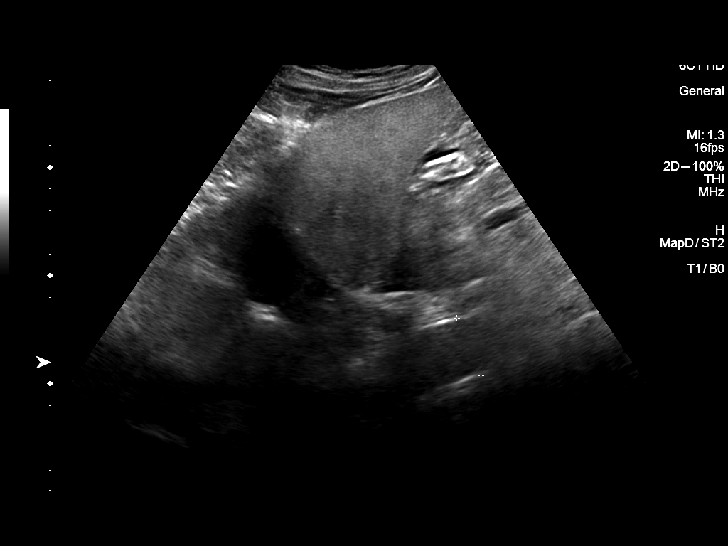
[im 4/17]
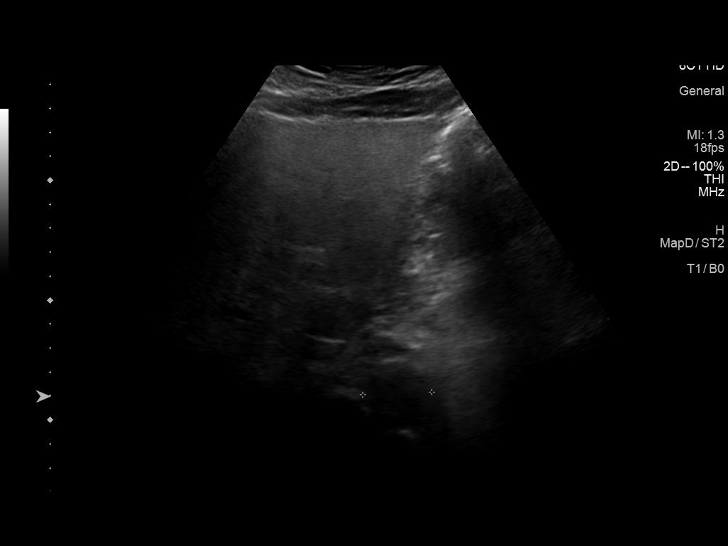
[im 5/17]
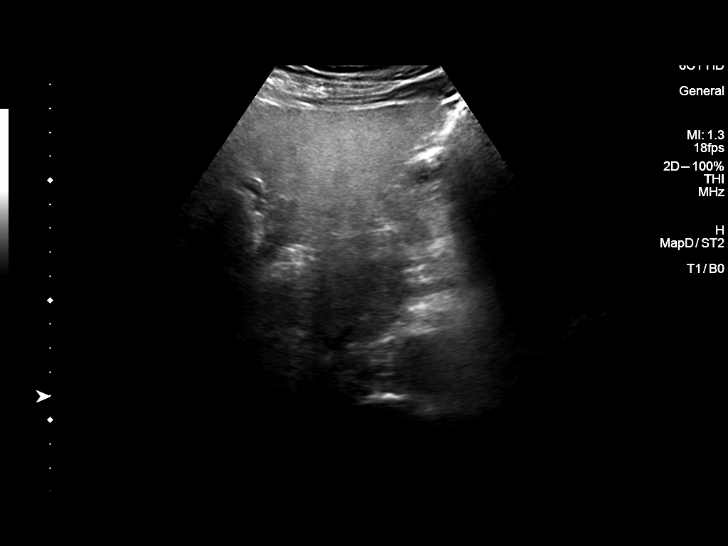
[im 6/17]
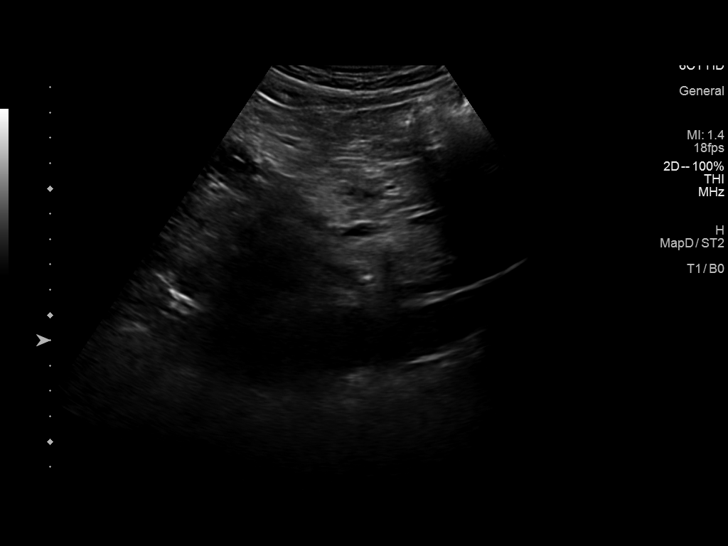
[im 7/17]
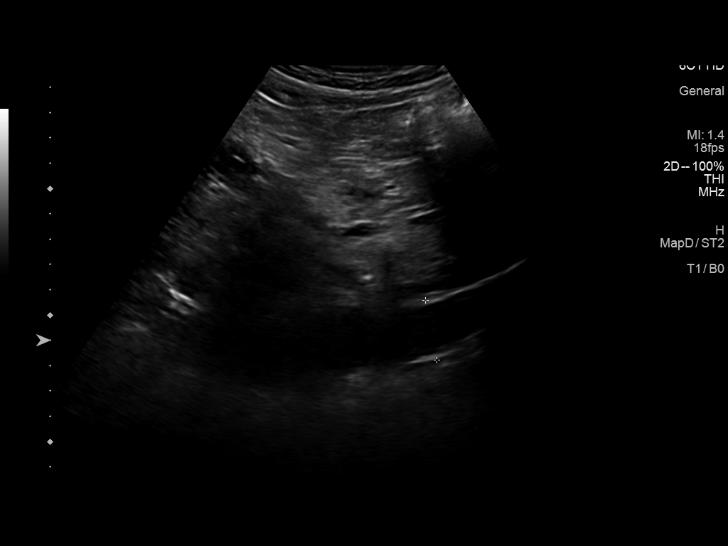
[im 8/17]
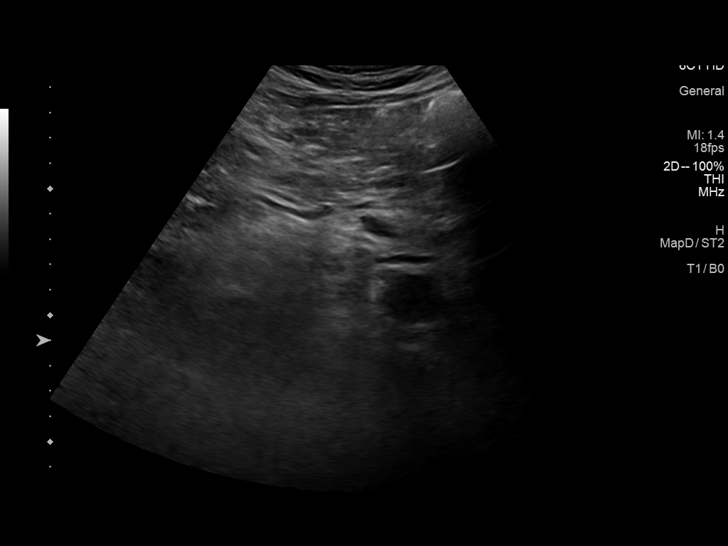
[im 10/17]
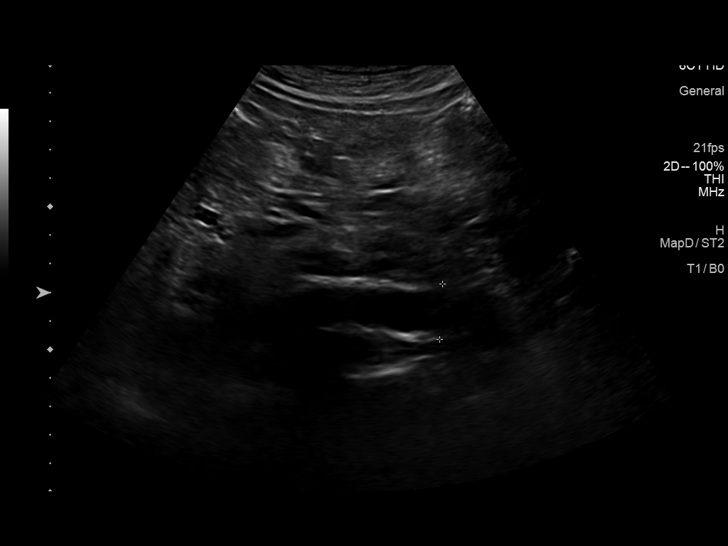
[im 11/17]
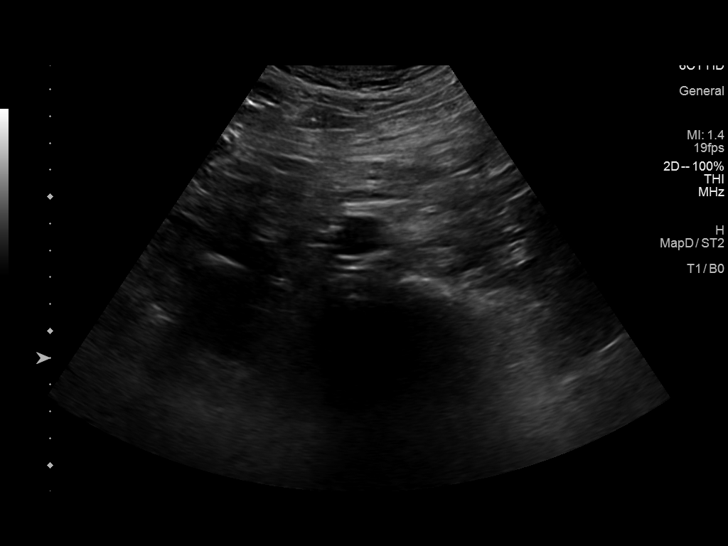
[im 12/17]
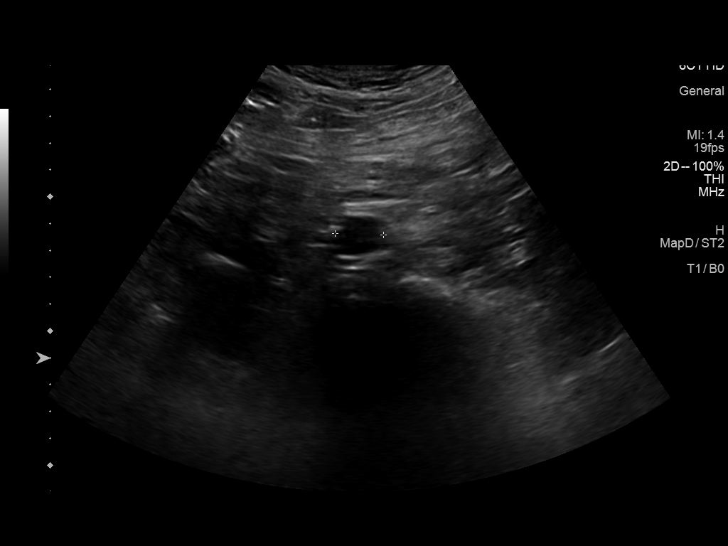
[im 13/17]
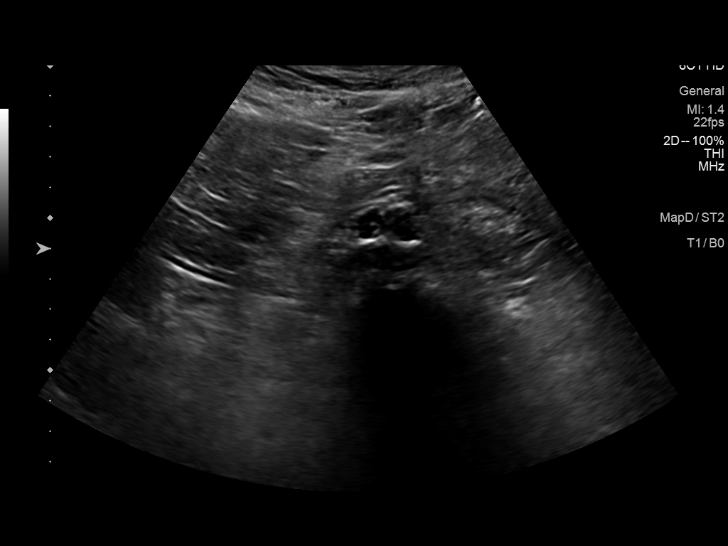
[im 14/17]
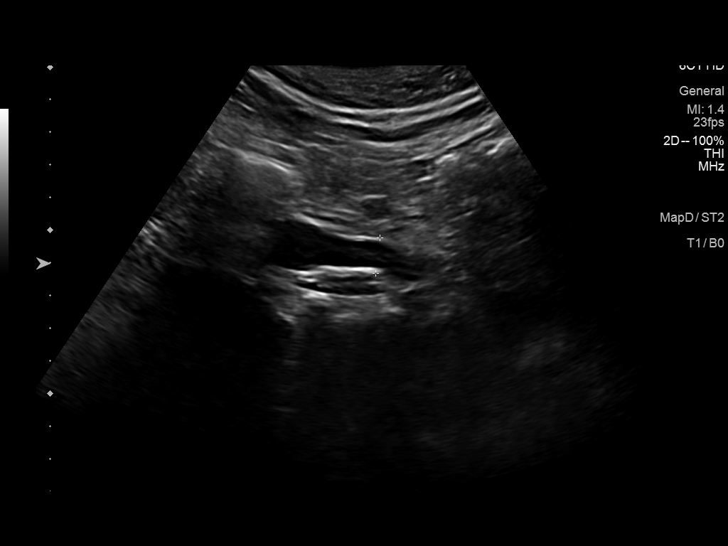
[im 16/17]
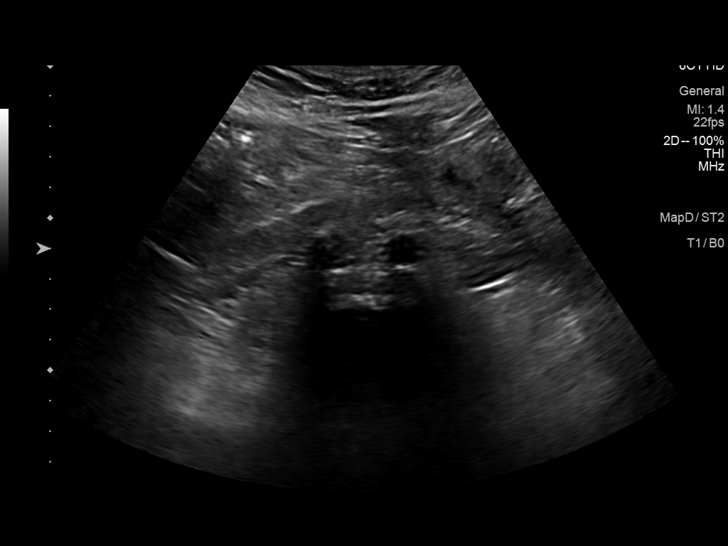
[im 17/17]
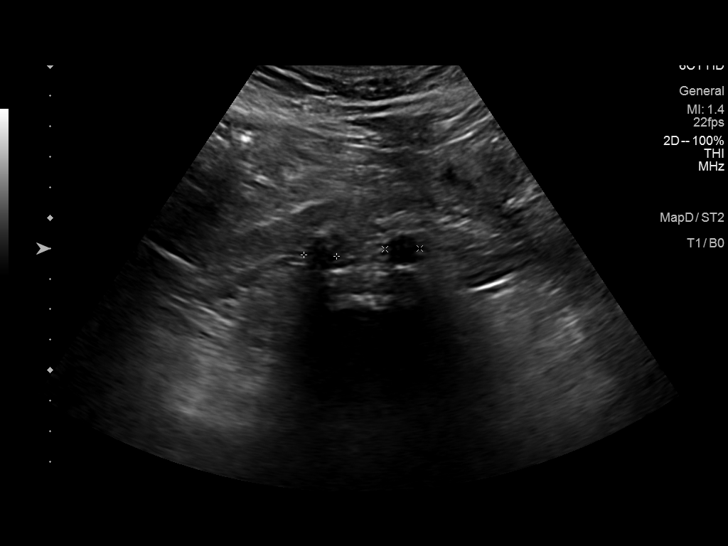

[14 of 17 positions shown; findings below may reference images not displayed]

FINDINGS: Abdominal aortic measurements as follows:

Proximal:  2.9 x 2.9 cm

Mid:  2.4 x 2.5 cm

Distal:  2.0 x 1.8 cm
IMPRESSION: Mild ectasia of the abdominal aorta with the mid aspect of the
abdominal aorta measuring 2.5 cm in diameter. Ectatic abdominal
aorta at risk for aneurysm development. Recommend followup by
ultrasound in 5 years. This recommendation follows ACR consensus
guidelines: White Paper of the ACR Incidental Findings Committee II

## 2019-01-12 ENCOUNTER — Other Ambulatory Visit: Payer: Self-pay | Admitting: Family Medicine

## 2019-01-12 DIAGNOSIS — F3162 Bipolar disorder, current episode mixed, moderate: Secondary | ICD-10-CM

## 2019-01-12 DIAGNOSIS — I10 Essential (primary) hypertension: Secondary | ICD-10-CM

## 2019-01-13 ENCOUNTER — Other Ambulatory Visit: Payer: Self-pay | Admitting: Family Medicine

## 2019-01-13 DIAGNOSIS — I10 Essential (primary) hypertension: Secondary | ICD-10-CM

## 2019-01-14 ENCOUNTER — Telehealth: Payer: Self-pay | Admitting: *Deleted

## 2019-01-14 MED ORDER — LISINOPRIL 10 MG PO TABS: ORAL_TABLET | ORAL | 0 refills | Status: DC

## 2019-01-14 NOTE — Telephone Encounter (Signed)
Requested medication (s) are due for refill today: yes  Requested medication (s) are on the active medication list:  yes  Last refill:  10/14/2018  Future visit scheduled: no  Notes to clinic:   no valid encounter within last 6 months   Requested Prescriptions  Pending Prescriptions Disp Refills   lisinopril (ZESTRIL) 10 MG tablet [Pharmacy Med Name: LISINOPRIL 10 MG TABLET] 90 tablet 0    Sig: TAKE 1 TABLET BY MOUTH EVERY DAY FOR BLOOD PRESSURE      Cardiovascular:  ACE Inhibitors Failed - 01/13/2019  9:58 AM      Failed - Cr in normal range and within 180 days    Creat  Date Value Ref Range Status  05/04/2015 0.82 0.70 - 1.25 mg/dL Final   Creatinine, Ser  Date Value Ref Range Status  07/16/2018 0.79 0.76 - 1.27 mg/dL Final          Failed - K in normal range and within 180 days    Potassium  Date Value Ref Range Status  07/16/2018 4.5 3.5 - 5.2 mmol/L Final          Failed - Valid encounter within last 6 months    Recent Outpatient Visits           3 months ago Need for prophylactic vaccination and inoculation against influenza   [facility], Meda Coffee, MD   6 months ago Medicare annual wellness visit, subsequent   [facility], Asencion Partridge, MD   1 year ago Need for influenza vaccination   [facility], Asencion Partridge, MD   1 year ago Primary osteoarthritis of left shoulder   [facility], Marolyn Hammock, PA-C   1 year ago Essential hypertension   Primary [facility] at [facility], Marolyn Hammock, PA-C              Passed - Patient is not pregnant      Passed - Last BP in normal range    BP Readings from Last 1 Encounters:  07/16/18 138/82

## 2019-01-14 NOTE — Telephone Encounter (Signed)
Prescription was sent  Please schedule an appointment

## 2019-01-24 ENCOUNTER — Telehealth: Payer: Self-pay | Admitting: Family Medicine

## 2019-01-24 NOTE — Telephone Encounter (Signed)
Called pt LVM for him to call back FR

## 2019-03-21 NOTE — Telephone Encounter (Signed)
Done

## 2019-04-03 ENCOUNTER — Other Ambulatory Visit: Payer: Self-pay

## 2019-04-03 ENCOUNTER — Telehealth (INDEPENDENT_AMBULATORY_CARE_PROVIDER_SITE_OTHER): Payer: Medicare Other | Admitting: Family Medicine

## 2019-04-03 DIAGNOSIS — I10 Essential (primary) hypertension: Secondary | ICD-10-CM

## 2019-04-03 DIAGNOSIS — F3162 Bipolar disorder, current episode mixed, moderate: Secondary | ICD-10-CM

## 2019-04-03 MED ORDER — LISINOPRIL 10 MG PO TABS: ORAL_TABLET | ORAL | 1 refills | Status: DC

## 2019-04-03 MED ORDER — DULOXETINE HCL 60 MG PO CPEP: 60.0000 mg | ORAL_CAPSULE | Freq: Two times a day (BID) | ORAL | 1 refills | Status: DC

## 2019-04-03 NOTE — Progress Notes (Signed)
Virtual Visit via Video Note  I connected with Keith Cobb on 04/03/19 at 558pm by a video enabled telemedicine application [employer] and verified that I am speaking with the correct person using two identifiers.   I discussed the limitations, risks, security and privacy concerns of performing an evaluation and management service by telephone and the availability of in person appointments. I also discussed with the patient that there may be a patient responsible charge related to this service. The patient expressed understanding and agreed to proceed, consent obtained  Chief complaint:  Chief Complaint  Patient presents with  . Medication Refill    need refills on depression and BP medication. pt has nightmares sometimes and sometimes wakes up crying on the days he feels depressed. it's not an everyday thing, but the pt stats is does happen every now and then.pt hasn't had any issues with bp at home.     History of Present Illness: Keith Cobb is a 69 y.o. male  Depression: Last discussed at medicare wellness visit July 16, 2018. Had reported depression in the past but with mood swings and prior notes thought to have possible bipolar disorder.  Thought to be possibly complicated by alcohol addiction/abuse (8-9 drinks per day at that time and previous alcohol rehab), substance-induced mood disorder.   Recommended follow-up with psychiatrist for medication adjustments as not on mood stabilizer.  Recommended alcohol cessation/cutting back with resources provided for professional help.  He refused psychiatrist referral at that time.  I did refill his Cymbalta 60 mg twice daily temporarily but plan for follow-up visit in 1 month.  Has not followed up since July 2020.  Has ups and downs. Wife's son passed away last 08-29-2022. Having a lot of fear with the pandemic. Has not yet received covid vaccine.  Has some bad dreams, afraid of dying. Tearful at times. Denies mood swings, not like last year.   Alcohol: still drinking, drinking about 5 beers per day. Did not pursue any resources for alcohol cessation.  Still taking Cymbalta twice per day.  Sometimes feels down, other times has lots of energy, feeling happy, staying up late at times - moderate feelings of thee energy levels at times.  Denies suicidal ideation or homicidal ideation.  Using OTC testosterone booster. Declines therapy/counseling.     Depression screen [facility] 2/9 04/03/2019 07/16/2018 10/25/2017 05/19/2017 01/16/2017  Decreased Interest 0 0 0 0 0  Down, Depressed, Hopeless 1 0 0 0 0  PHQ - 2 Score 1 0 0 0 0  Altered sleeping - - - - -  Tired, decreased energy - - - - -  Change in appetite - - - - -  Feeling bad or failure about yourself  - - - - -  Trouble concentrating - - - - -  Moving slowly or fidgety/restless - - - - -  Suicidal thoughts - - - - -  PHQ-9 Score - - - - -  Difficult doing work/chores - - - - -    Hypertension: Lisinopril 10 mg daily.  Taking meds daily.  Home readings: 123/79 this afternoon.  Constitutional: Negative for fatigue and unexpected weight change.  Eyes: Negative for visual disturbance.  Respiratory: Negative for cough, chest tightness and shortness of breath.   Cardiovascular: Negative for chest pain, palpitations and leg swelling.  Gastrointestinal: Negative for abdominal pain and blood in stool.  Neurological: Negative for dizziness, light-headedness and headaches.     BP Readings from Last 3 Encounters:  04/03/19 140/87  07/16/18 138/82  10/25/17 138/84   Lab Results  Component Value Date   CREATININE 0.79 07/16/2018       Patient Active Problem List   Diagnosis Date Noted  . Smoker 05/04/2015  . HTN (hypertension) 03/19/2012  . Depression 03/19/2012   Past Medical History:  Diagnosis Date  . Anxiety   . Arthritis   . Depression   . Hypertension    No past surgical history on file. Allergies  Allergen Reactions  . Bee Venom    Prior to Admission  medications   Medication Sig Start Date End Date Taking? Authorizing Provider  DULoxetine (CYMBALTA) 60 MG capsule TAKE 1 CAPSULE BY MOUTH TWICE A DAY 01/13/19  Yes [name], MD  lisinopril (ZESTRIL) 10 MG tablet TAKE 1 TABLET BY MOUTH EVERY DAY FOR BLOOD PRESSURE 01/14/19  Yes [name], MD   Social History   Socioeconomic History  . Marital status: Married    Spouse name: Not on file  . Number of children: Not on file  . Years of education: Not on file  . Highest education level: Not on file  Occupational History  . Not on file  Tobacco Use  . Smoking status: Current Some Day Smoker    Packs/day: 0.25    Years: 39.00    Pack years: 9.75    Types: Cigarettes  . Smokeless tobacco: Never Used  Substance and Sexual Activity  . Alcohol use: Yes    Alcohol/week: 12.0 standard drinks    Types: 12 Cans of beer per week  . Drug use: No  . Sexual activity: Not on file  Other Topics Concern  . Not on file  Social History Narrative  . Not on file   Social Determinants of Health   Financial Resource Strain:   . Difficulty of Paying Living Expenses:   Food Insecurity:   . Worried About Programme researcher, broadcasting/film/video in the Last Year:   . Barista in the Last Year:   Transportation Needs:   . Freight forwarder (Medical):   Marland Kitchen Lack of Transportation (Non-Medical):   Physical Activity:   . Days of Exercise per Week:   . Minutes of Exercise per Session:   Stress:   . Feeling of Stress :   Social Connections:   . Frequency of Communication with Friends and Family:   . Frequency of Social Gatherings with Friends and Family:   . Attends Religious Services:   . Active Member of Clubs or Organizations:   . Attends Banker Meetings:   Marland Kitchen Marital Status:   Intimate Partner Violence:   . Fear of Current or Ex-Partner:   . Emotionally Abused:   Marland Kitchen Physically Abused:   . Sexually Abused:     Observations/Objective:   Assessment and Plan: Bipolar 1  disorder, mixed, moderate (HCC) - Plan: DULoxetine (CYMBALTA) 60 MG capsule  -History of bipolar per chart, symptoms do sound concerning for bipolar disorder, not typical depression.  Psychiatry eval recommended last year but declined.  Discussed again today and he did agree to meet with psychiatry.  Potentially may need mood stabilizer.  Denies suicidal/homicidal ideation.  Will continue Cymbalta for now.  -Alcohol abuse/addiction also discussed with resources provided.  Potentially may have substance-induced mood disorder as component  -Therapy/counseling declined  Essential hypertension - Plan: lisinopril (ZESTRIL) 10 MG tablet  -Stable, continue same regimen, lab only visit next few weeks.  25-month follow-up  Follow Up Instructions:  6 months.  I discussed the assessment and treatment plan with the patient. The patient was provided an opportunity to ask questions and all were answered. The patient agreed with the plan and demonstrated an understanding of the instructions.   The patient was advised to call back or seek an in-person evaluation if the symptoms worsen or if the condition fails to improve as anticipated.  I provided 24 minutes of non-face-to-face time during this encounter.   Wendie Agreste, MD

## 2019-04-03 NOTE — Patient Instructions (Addendum)
   Nurse visit for labs in next 2 weeks, same dose lisinopril for now.   COVID-19 Vaccine Information can be found at: [email or web address] For questions related to vaccine distribution or appointments, please email [email or web address] or call [phone].   As we discussed I am suspicious of bipolar disorder, not just depression may be causing your mood symptoms.  Alcohol abuse can also worsen mood symptoms.  I do recommend meeting with a substance abuse specialist and decreasing alcohol use, see some information below.  I will refill the Cymbalta for now, but will refer you to psychiatry to evaluate your medications and recommendations on any changes.  Return to the clinic or go to the nearest emergency room if any of your symptoms worsen or new symptoms occur.  Alcohol cessation resources: [facility]  Address: [address], [place], [place] [address]  Phone: [phone]  [facility]  Address: [address] [place], [place] [address]  Phone: [phone]  [facility] Address: [address] [place], [place], [place] [address]  Phone: [phone]        If you have lab work done today you will be contacted with your lab results within the next 2 weeks.  If you have not heard from [place] then please contact us. The fastest way to get your results is to register for My Chart.   IF you received an x-ray today, you will receive an invoice from [facility]. Please contact [facility] at [phone] with questions or concerns regarding your invoice.   IF you received labwork today, you will receive an invoice from [facility]. Please contact [facility] at [phone] with questions or concerns regarding your invoice.   Our billing staff will not be able to assist you with questions regarding bills from these companies.  You will be contacted with the lab results as soon as they are  available. The fastest way to get your results is to activate your My Chart account. Instructions are located on the last page of this paperwork. If you have not heard from [place] regarding the results in 2 weeks, please contact this office.

## 2019-04-04 ENCOUNTER — Telehealth: Payer: Medicare Other | Admitting: Family Medicine

## 2019-04-15 ENCOUNTER — Ambulatory Visit: Payer: Medicare Other

## 2019-04-15 ENCOUNTER — Other Ambulatory Visit: Payer: Self-pay

## 2019-04-15 DIAGNOSIS — I10 Essential (primary) hypertension: Secondary | ICD-10-CM | POA: Diagnosis not present

## 2019-04-15 LAB — COMPREHENSIVE METABOLIC PANEL
ALT: 32 IU/L (ref 0–44)
AST: 36 IU/L (ref 0–40)
Albumin/Globulin Ratio: 1.8 (ref 1.2–2.2)
Albumin: 4.2 g/dL (ref 3.8–4.8)
Alkaline Phosphatase: 82 IU/L (ref 39–117)
BUN/Creatinine Ratio: 12 (ref 10–24)
BUN: 9 mg/dL (ref 8–27)
Bilirubin Total: 1 mg/dL (ref 0.0–1.2)
CO2: 25 mmol/L (ref 20–29)
Calcium: 9.3 mg/dL (ref 8.6–10.2)
Chloride: 99 mmol/L (ref 96–106)
Creatinine, Ser: 0.78 mg/dL (ref 0.76–1.27)
GFR calc Af Amer: 107 mL/min/{1.73_m2} (ref >59)
GFR calc non Af Amer: 93 mL/min/{1.73_m2} (ref >59)
Globulin, Total: 2.4 g/dL (ref 1.5–4.5)
Glucose: 118 mg/dL — ABNORMAL HIGH (ref 65–99)
Potassium: 4.4 mmol/L (ref 3.5–5.2)
Sodium: 137 mmol/L (ref 134–144)
Total Protein: 6.6 g/dL (ref 6.0–8.5)

## 2019-04-18 ENCOUNTER — Encounter: Payer: Self-pay | Admitting: Family Medicine

## 2019-04-18 NOTE — Telephone Encounter (Signed)
Pt would like to know if there were concerns on his blood work yesterday.

## 2019-04-24 ENCOUNTER — Ambulatory Visit (INDEPENDENT_AMBULATORY_CARE_PROVIDER_SITE_OTHER): Payer: Medicare Other | Admitting: Family Medicine

## 2019-04-24 ENCOUNTER — Other Ambulatory Visit: Payer: Self-pay

## 2019-04-24 ENCOUNTER — Encounter: Payer: Self-pay | Admitting: Family Medicine

## 2019-04-24 VITALS — BP 139/85 | HR 99 | Temp 98.2°F | Ht 67.5 in | Wt 211.0 lb

## 2019-04-24 DIAGNOSIS — B0230 Zoster ocular disease, unspecified: Secondary | ICD-10-CM | POA: Diagnosis not present

## 2019-04-24 DIAGNOSIS — B0229 Other postherpetic nervous system involvement: Secondary | ICD-10-CM

## 2019-04-24 MED ORDER — HYDROCODONE-ACETAMINOPHEN 5-325 MG PO TABS: 1.0000 | ORAL_TABLET | Freq: Four times a day (QID) | ORAL | 0 refills | Status: DC | PRN

## 2019-04-24 MED ORDER — VALACYCLOVIR HCL 1 G PO TABS: 1000.0000 mg | ORAL_TABLET | Freq: Three times a day (TID) | ORAL | 0 refills | Status: DC

## 2019-04-24 NOTE — Progress Notes (Signed)
Subjective:  Patient ID: Keith Cobb, male    DOB: 09-Dec-1950  Age: 69 y.o. MRN: [id number]  CC:  Chief Complaint  Patient presents with  . edma    on pt's R eye, nost and R side of pt's face. pt states it's painful. showed up sunday afternoon. pt was doing yard work on sunday. pt thinks it maybe poison oak or ivy.  pt has some drainage coming from his R eye. pt is sure he wasn't stung by bee.    HPI Keith Cobb presents for   Face Swelling:  Started 3 days ago. Noticed after mowing. Was picking some weeds, moving some brush - not sure if poison ivy there. Started with soreness 2 days ago - R side of face. Painful. R face only. No left sided symptoms. R face to cron of head.  Rash on face only - no genital or mouth rash.  No sick contacts.  R eye watering -  No blurry vision.   Tx: neosporin on forehead. Visine eye drops. Calamine lotion lotion.   History of alcohol abuse, bipolar per chart, discussed at telemedicine visit March 24 and plan for psychiatry follow-up.  We continued Cymbalta at that time.  Has not had shingles vaccine.   History Patient Active Problem List   Diagnosis Date Noted  . Smoker 05/04/2015  . HTN (hypertension) 03/19/2012  . Depression 03/19/2012   Past Medical History:  Diagnosis Date  . Anxiety   . Arthritis   . Depression   . Hypertension    No past surgical history on file. Allergies  Allergen Reactions  . Bee Venom    Prior to Admission medications   Medication Sig Start Date End Date Taking? Authorizing Provider  DULoxetine (CYMBALTA) 60 MG capsule Take 1 capsule (60 mg total) by mouth 2 (two) times daily. 04/03/19  Yes [name], MD  lisinopril (ZESTRIL) 10 MG tablet TAKE 1 TABLET BY MOUTH EVERY DAY FOR BLOOD PRESSURE 04/03/19  Yes [name], MD   Social History   Socioeconomic History  . Marital status: Married    Spouse name: Not on file  . Number of children: Not on file  . Years of education: Not on file    . Highest education level: Not on file  Occupational History  . Not on file  Tobacco Use  . Smoking status: Current Some Day Smoker    Packs/day: 0.25    Years: 39.00    Pack years: 9.75    Types: Cigarettes  . Smokeless tobacco: Never Used  Substance and Sexual Activity  . Alcohol use: Yes    Alcohol/week: 12.0 standard drinks    Types: 12 Cans of beer per week  . Drug use: No  . Sexual activity: Not on file  Other Topics Concern  . Not on file  Social History Narrative  . Not on file   Social Determinants of Health   Financial Resource Strain:   . Difficulty of Paying Living Expenses:   Food Insecurity:   . Worried About Programme researcher, broadcasting/film/video in the Last Year:   . Barista in the Last Year:   Transportation Needs:   . Freight forwarder (Medical):   Marland Kitchen Lack of Transportation (Non-Medical):   Physical Activity:   . Days of Exercise per Week:   . Minutes of Exercise per Session:   Stress:   . Feeling of Stress :   Social Connections:   . Frequency  of Communication with Friends and Family:   . Frequency of Social Gatherings with Friends and Family:   . Attends Religious Services:   . Active Member of Clubs or Organizations:   . Attends Archivist Meetings:   Marland Kitchen Marital Status:   Intimate Partner Violence:   . Fear of Current or Ex-Partner:   . Emotionally Abused:   Marland Kitchen Physically Abused:   . Sexually Abused:     Review of Systems As above in HPi  Objective:   Vitals:   04/24/19 1608  BP: 139/85  Pulse: 99  Temp: 98.2 F (36.8 C)  TempSrc: Temporal  SpO2: 98%  Weight: 211 lb (95.7 kg)  Height: 5' 7.5" (1.715 m)    Physical Exam Vitals reviewed.  Constitutional:      General: He is not in acute distress.    Appearance: He is well-developed.  HENT:     Head: Normocephalic and atraumatic.  Eyes:      Comments: Anterior chamber clear,  Proparacaine 2 gtts applied,with less soreness after gtts. Lids everted, no foreign body  identified. Fluorescein applied, uptake as above.   Flushed with sterile water, no complications.   Cardiovascular:     Rate and Rhythm: Normal rate.  Pulmonary:     Effort: Pulmonary effort is normal.  Neurological:     Mental Status: He is alert and oriented to person, place, and time.           Assessment & Plan:  Keith Cobb is a 69 y.o. male . 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 - Plan: valACYclovir (VALTREX) 1000 MG tablet  Herpes zoster ophthalmicus of right eye - Plan: valACYclovir (VALTREX) 1000 MG tablet  -Zoster of face, suspected zoster ophthalmicus.  -Start Valtrex 1 g 3 times daily, hydrocodone provided if needed for pain, potential side effects discussed.  Will have urgent evaluation with ophthalmology tomorrow morning.  Synthetic eyedrops if needed for now.  Meds ordered this encounter  Medications  . valACYclovir (VALTREX) 1000 MG tablet    Sig: Take 1 tablet (1,000 mg total) by mouth 3 (three) times daily.    Dispense:  21 tablet    Refill:  0  . HYDROcodone-acetaminophen (NORCO/VICODIN) 5-325 MG tablet    Sig: Take 1 tablet by mouth every 6 (six) hours as needed for moderate pain.    Dispense:  20 tablet    Refill:  0   Patient Instructions    Unfortunately rash does appear to be due to shingles, and may be involving your right back.  Start Valtrex 1 g 3 times per day as soon as possible, and I will have you evaluated tomorrow with ophthalmology.  Okay to use synthetic eyedrops for now if needed.  Hydrocodone prescribed if needed for pain.  Try to avoid combination with other sedating medications or alcohol.   Shingles  Shingles, which is also known as herpes zoster, is an infection that causes a painful skin rash and fluid-filled blisters. It is caused by a virus. Shingles only develops in people who:  Have had chickenpox.  Have been given a medicine to protect against chickenpox (have been vaccinated). Shingles is rare in this  group. What are the causes? Shingles is caused by varicella-zoster virus (VZV). This is the same virus that causes chickenpox. After a person is exposed to VZV, the virus stays in the body in an inactive (dormant) state. Shingles develops if the virus is reactivated. This can happen many years after  the first (initial) exposure to VZV. It is not known what causes this virus to be reactivated. What increases the risk? People who have had chickenpox or received the chickenpox vaccine are at risk for shingles. Shingles infection is more common in people who:  Are older than age 55.  Have a weakened disease-fighting system (immune system), such as people with: ? HIV. ? AIDS. ? Cancer.  Are taking medicines that weaken the immune system, such as transplant medicines.  Are experiencing a lot of stress. What are the signs or symptoms? Early symptoms of this condition include itching, tingling, and pain in an area on your skin. Pain may be described as burning, stabbing, or throbbing. A few days or weeks after early symptoms start, a painful red rash appears. The rash is usually on one side of the body and has a band-like or belt-like pattern. The rash eventually turns into fluid-filled blisters that break open, change into scabs, and dry up in about 2-3 weeks. At any time during the infection, you may also develop:  A fever.  Chills.  A headache.  An upset stomach. How is this diagnosed? This condition is diagnosed with a skin exam. Skin or fluid samples may be taken from the blisters before a diagnosis is made. These samples are examined under a microscope or sent to a lab for testing. How is this treated? The rash may last for several weeks. There is not a specific cure for this condition. Your health care provider will probably prescribe medicines to help you manage pain, recover more quickly, and avoid long-term problems. Medicines may include:  Antiviral drugs.  Anti-inflammatory  drugs.  Pain medicines.  Anti-itching medicines (antihistamines). If the area involved is on your face, you may be referred to a specialist, such as an eye doctor (ophthalmologist) or an ear, nose, and throat (ENT) doctor (otolaryngologist) to help you avoid eye problems, chronic pain, or disability. Follow these instructions at home: Medicines  Take over-the-counter and prescription medicines only as told by your health care provider.  Apply an anti-itch cream or numbing cream to the affected area as told by your health care provider. Relieving itching and discomfort   Apply cold, wet cloths (cold compresses) to the area of the rash or blisters as told by your health care provider.  Cool baths can be soothing. Try adding baking soda or dry oatmeal to the water to reduce itching. Do not bathe in hot water. Blister and rash care  Keep your rash covered with a loose bandage (dressing). Wear loose-fitting clothing to help ease the pain of material rubbing against the rash.  Keep your rash and blisters clean by washing the area with mild soap and cool water as told by your health care provider.  Check your rash every day for signs of infection. Check for: ? More redness, swelling, or pain. ? Fluid or blood. ? Warmth. ? Pus or a bad smell.  Do not scratch your rash or pick at your blisters. To help avoid scratching: ? Keep your fingernails clean and cut short. ? Wear gloves or mittens while you sleep, if scratching is a problem. General instructions  Rest as told by your health care provider.  Keep all follow-up visits as told by your health care provider. This is important.  Wash your hands often with soap and water. If soap and water are not available, use hand sanitizer. Doing this lowers your chance of getting a bacterial skin infection.  Before your blisters change  into scabs, your shingles infection can cause chickenpox in people who have never had it or have never been  vaccinated against it. To prevent this from happening, avoid contact with other people, especially: ? Babies. ? Pregnant women. ? Children who have eczema. ? Elderly people who have transplants. ? People who have chronic illnesses, such as cancer or AIDS. Contact a health care provider if:  Your pain is not relieved with prescribed medicines.  Your pain does not get better after the rash heals.  You have signs of infection in the rash area, such as: ? More redness, swelling, or pain around the rash. ? Fluid or blood coming from the rash. ? The rash area feeling warm to the touch. ? Pus or a bad smell coming from the rash. Get help right away if:  The rash is on your face or nose.  You have facial pain, pain around your eye area, or loss of feeling on one side of your face.  You have difficulty seeing.  You have ear pain or have ringing in your ear.  You have a loss of taste.  Your condition gets worse. Summary  Shingles, which is also known as herpes zoster, is an infection that causes a painful skin rash and fluid-filled blisters.  This condition is diagnosed with a skin exam. Skin or fluid samples may be taken from the blisters and examined before the diagnosis is made.  Keep your rash covered with a loose bandage (dressing). Wear loose-fitting clothing to help ease the pain of material rubbing against the rash.  Before your blisters change into scabs, your shingles infection can cause chickenpox in people who have never had it or have never been vaccinated against it. This information is not intended to replace advice given to you by your health care provider. Make sure you discuss any questions you have with your health care provider. Document Revised: 04/20/2018 Document Reviewed: 08/31/2016 Elsevier Patient Education  [employer].    If you have lab work done today you will be contacted with your lab results within the next 2 weeks.  If you have not heard  from [place] then please contact us. The fastest way to get your results is to register for My Chart.   IF you received an x-ray today, you will receive an invoice from [facility]. Please contact [facility] Radiology at [phone] with questions or concerns regarding your invoice.   IF you received labwork today, you will receive an invoice from [facility]. Please contact [facility] at [phone] with questions or concerns regarding your invoice.   Our billing staff will not be able to assist you with questions regarding bills from these companies.  You will be contacted with the lab results as soon as they are available. The fastest way to get your results is to activate your My Chart account. Instructions are located on the last page of this paperwork. If you have not heard from [place] regarding the results in 2 weeks, please contact this office.         Signed, Meredith Staggers, MD [facility]

## 2019-04-24 NOTE — Patient Instructions (Addendum)
Unfortunately rash does appear to be due to shingles, and may be involving your right back.  Start Valtrex 1 g 3 times per day as soon as possible, and I will have you evaluated tomorrow with ophthalmology.  Okay to use synthetic eyedrops for now if needed.  Hydrocodone prescribed if needed for pain.  Try to avoid combination with other sedating medications or alcohol.   Shingles  Shingles, which is also known as herpes zoster, is an infection that causes a painful skin rash and fluid-filled blisters. It is caused by a virus. Shingles only develops in people who:  Have had chickenpox.  Have been given a medicine to protect against chickenpox (have been vaccinated). Shingles is rare in this group. What are the causes? Shingles is caused by varicella-zoster virus (VZV). This is the same virus that causes chickenpox. After a person is exposed to VZV, the virus stays in the body in an inactive (dormant) state. Shingles develops if the virus is reactivated. This can happen many years after the first (initial) exposure to VZV. It is not known what causes this virus to be reactivated. What increases the risk? People who have had chickenpox or received the chickenpox vaccine are at risk for shingles. Shingles infection is more common in people who:  Are older than age 34.  Have a weakened disease-fighting system (immune system), such as people with: ? HIV. ? AIDS. ? Cancer.  Are taking medicines that weaken the immune system, such as transplant medicines.  Are experiencing a lot of stress. What are the signs or symptoms? Early symptoms of this condition include itching, tingling, and pain in an area on your skin. Pain may be described as burning, stabbing, or throbbing. A few days or weeks after early symptoms start, a painful red rash appears. The rash is usually on one side of the body and has a band-like or belt-like pattern. The rash eventually turns into fluid-filled blisters that break  open, change into scabs, and dry up in about 2-3 weeks. At any time during the infection, you may also develop:  A fever.  Chills.  A headache.  An upset stomach. How is this diagnosed? This condition is diagnosed with a skin exam. Skin or fluid samples may be taken from the blisters before a diagnosis is made. These samples are examined under a microscope or sent to a lab for testing. How is this treated? The rash may last for several weeks. There is not a specific cure for this condition. Your health care provider will probably prescribe medicines to help you manage pain, recover more quickly, and avoid long-term problems. Medicines may include:  Antiviral drugs.  Anti-inflammatory drugs.  Pain medicines.  Anti-itching medicines (antihistamines). If the area involved is on your face, you may be referred to a specialist, such as an eye doctor (ophthalmologist) or an ear, nose, and throat (ENT) doctor (otolaryngologist) to help you avoid eye problems, chronic pain, or disability. Follow these instructions at home: Medicines  Take over-the-counter and prescription medicines only as told by your health care provider.  Apply an anti-itch cream or numbing cream to the affected area as told by your health care provider. Relieving itching and discomfort   Apply cold, wet cloths (cold compresses) to the area of the rash or blisters as told by your health care provider.  Cool baths can be soothing. Try adding baking soda or dry oatmeal to the water to reduce itching. Do not bathe in hot water. Blister and rash care  Keep your rash covered with a loose bandage (dressing). Wear loose-fitting clothing to help ease the pain of material rubbing against the rash.  Keep your rash and blisters clean by washing the area with mild soap and cool water as told by your health care provider.  Check your rash every day for signs of infection. Check for: ? More redness, swelling, or pain. ? Fluid  or blood. ? Warmth. ? Pus or a bad smell.  Do not scratch your rash or pick at your blisters. To help avoid scratching: ? Keep your fingernails clean and cut short. ? Wear gloves or mittens while you sleep, if scratching is a problem. General instructions  Rest as told by your health care provider.  Keep all follow-up visits as told by your health care provider. This is important.  Wash your hands often with soap and water. If soap and water are not available, use hand sanitizer. Doing this lowers your chance of getting a bacterial skin infection.  Before your blisters change into scabs, your shingles infection can cause chickenpox in people who have never had it or have never been vaccinated against it. To prevent this from happening, avoid contact with other people, especially: ? Babies. ? Pregnant women. ? Children who have eczema. ? Elderly people who have transplants. ? People who have chronic illnesses, such as cancer or AIDS. Contact a health care provider if:  Your pain is not relieved with prescribed medicines.  Your pain does not get better after the rash heals.  You have signs of infection in the rash area, such as: ? More redness, swelling, or pain around the rash. ? Fluid or blood coming from the rash. ? The rash area feeling warm to the touch. ? Pus or a bad smell coming from the rash. Get help right away if:  The rash is on your face or nose.  You have facial pain, pain around your eye area, or loss of feeling on one side of your face.  You have difficulty seeing.  You have ear pain or have ringing in your ear.  You have a loss of taste.  Your condition gets worse. Summary  Shingles, which is also known as herpes zoster, is an infection that causes a painful skin rash and fluid-filled blisters.  This condition is diagnosed with a skin exam. Skin or fluid samples may be taken from the blisters and examined before the diagnosis is made.  Keep your rash  covered with a loose bandage (dressing). Wear loose-fitting clothing to help ease the pain of material rubbing against the rash.  Before your blisters change into scabs, your shingles infection can cause chickenpox in people who have never had it or have never been vaccinated against it. This information is not intended to replace advice given to you by your health care provider. Make sure you discuss any questions you have with your health care provider. Document Revised: 04/20/2018 Document Reviewed: 08/31/2016 Elsevier Patient Education  [employer].    If you have lab work done today you will be contacted with your lab results within the next 2 weeks.  If you have not heard from [place] then please contact us. The fastest way to get your results is to register for My Chart.   IF you received an x-ray today, you will receive an invoice from [facility]. Please contact [facility] at [phone] with questions or concerns regarding your invoice.   IF you received labwork today, you will receive  an Pharmacologist from [employer]. Please contact [place] [facility] at [phone] with questions or concerns regarding your invoice.   Our billing staff will not be able to assist you with questions regarding bills from these companies.  You will be contacted with the lab results as soon as they are available. The fastest way to get your results is to activate your My Chart account. Instructions are located on the last page of this paperwork. If you have not heard from [place] regarding the results in 2 weeks, please contact this office.

## 2019-04-25 DIAGNOSIS — H2513 Age-related nuclear cataract, bilateral: Secondary | ICD-10-CM | POA: Diagnosis not present

## 2019-04-25 DIAGNOSIS — B0239 Other herpes zoster eye disease: Secondary | ICD-10-CM | POA: Diagnosis not present

## 2019-04-25 DIAGNOSIS — H35033 Hypertensive retinopathy, bilateral: Secondary | ICD-10-CM | POA: Diagnosis not present

## 2019-04-25 NOTE — Addendum Note (Signed)
Addended by: Meredith Staggers R on: 04/25/2019 10:31 AM   Modules accepted: Orders

## 2019-04-26 ENCOUNTER — Telehealth: Payer: Self-pay | Admitting: Family Medicine

## 2019-04-26 NOTE — Telephone Encounter (Signed)
Spoke with wife Claris Che to let her husband know that he should be fine to take his shingle medication along with his other medications. I advised her that the provider would not prescribe him nothing that would interact with his other medication.

## 2019-04-26 NOTE — Telephone Encounter (Signed)
Pts wife called in and is wanting a nurse to give her a call. Pt was proscribed medication for his shingles and wants to know if he can still take the rest of his medication with this shingles medication. [phone] Please advise.

## 2019-04-29 DIAGNOSIS — H2513 Age-related nuclear cataract, bilateral: Secondary | ICD-10-CM | POA: Diagnosis not present

## 2019-04-29 DIAGNOSIS — H2000 Unspecified acute and subacute iridocyclitis: Secondary | ICD-10-CM | POA: Diagnosis not present

## 2019-04-29 DIAGNOSIS — B0239 Other herpes zoster eye disease: Secondary | ICD-10-CM | POA: Diagnosis not present

## 2019-04-29 DIAGNOSIS — H40051 Ocular hypertension, right eye: Secondary | ICD-10-CM | POA: Diagnosis not present

## 2019-05-02 DIAGNOSIS — H2000 Unspecified acute and subacute iridocyclitis: Secondary | ICD-10-CM | POA: Diagnosis not present

## 2019-05-02 DIAGNOSIS — H40051 Ocular hypertension, right eye: Secondary | ICD-10-CM | POA: Diagnosis not present

## 2019-05-02 DIAGNOSIS — H2513 Age-related nuclear cataract, bilateral: Secondary | ICD-10-CM | POA: Diagnosis not present

## 2019-05-02 DIAGNOSIS — B0239 Other herpes zoster eye disease: Secondary | ICD-10-CM | POA: Diagnosis not present

## 2019-05-02 DIAGNOSIS — H35033 Hypertensive retinopathy, bilateral: Secondary | ICD-10-CM | POA: Diagnosis not present

## 2019-05-10 ENCOUNTER — Other Ambulatory Visit: Payer: Self-pay | Admitting: Family Medicine

## 2019-05-10 DIAGNOSIS — B0230 Zoster ocular disease, unspecified: Secondary | ICD-10-CM

## 2019-05-11 MED ORDER — HYDROCODONE-ACETAMINOPHEN 5-325 MG PO TABS: 1.0000 | ORAL_TABLET | Freq: Four times a day (QID) | ORAL | 0 refills | Status: DC | PRN

## 2019-05-11 NOTE — Telephone Encounter (Signed)
Refilled

## 2019-05-27 DIAGNOSIS — H35033 Hypertensive retinopathy, bilateral: Secondary | ICD-10-CM | POA: Diagnosis not present

## 2019-05-27 DIAGNOSIS — H2513 Age-related nuclear cataract, bilateral: Secondary | ICD-10-CM | POA: Diagnosis not present

## 2019-05-27 DIAGNOSIS — B0239 Other herpes zoster eye disease: Secondary | ICD-10-CM | POA: Diagnosis not present

## 2019-05-27 DIAGNOSIS — H40051 Ocular hypertension, right eye: Secondary | ICD-10-CM | POA: Diagnosis not present

## 2019-05-27 DIAGNOSIS — H2000 Unspecified acute and subacute iridocyclitis: Secondary | ICD-10-CM | POA: Diagnosis not present

## 2019-06-01 ENCOUNTER — Encounter: Payer: Self-pay | Admitting: Family Medicine

## 2019-07-08 ENCOUNTER — Telehealth: Payer: Self-pay | Admitting: *Deleted

## 2019-07-08 NOTE — Telephone Encounter (Signed)
Schedule AWV.  

## 2019-08-26 DIAGNOSIS — H40051 Ocular hypertension, right eye: Secondary | ICD-10-CM | POA: Diagnosis not present

## 2019-08-26 DIAGNOSIS — H2513 Age-related nuclear cataract, bilateral: Secondary | ICD-10-CM | POA: Diagnosis not present

## 2019-08-26 DIAGNOSIS — B0239 Other herpes zoster eye disease: Secondary | ICD-10-CM | POA: Diagnosis not present

## 2019-08-26 DIAGNOSIS — H2000 Unspecified acute and subacute iridocyclitis: Secondary | ICD-10-CM | POA: Diagnosis not present

## 2019-08-26 DIAGNOSIS — H35033 Hypertensive retinopathy, bilateral: Secondary | ICD-10-CM | POA: Diagnosis not present

## 2019-09-07 DIAGNOSIS — M1712 Unilateral primary osteoarthritis, left knee: Secondary | ICD-10-CM | POA: Diagnosis not present

## 2019-09-22 ENCOUNTER — Other Ambulatory Visit: Payer: Self-pay | Admitting: Family Medicine

## 2019-09-22 DIAGNOSIS — I10 Essential (primary) hypertension: Secondary | ICD-10-CM

## 2019-09-22 NOTE — Telephone Encounter (Signed)
Requested Prescriptions  Pending Prescriptions Disp Refills  . lisinopril (ZESTRIL) 10 MG tablet [Pharmacy Med Name: LISINOPRIL 10 MG TABLET] 90 tablet 0    Sig: TAKE 1 TABLET BY MOUTH EVERY DAY FOR BLOOD PRESSURE     Cardiovascular:  ACE Inhibitors Passed - 09/22/2019  9:46 AM      Passed - Cr in normal range and within 180 days    Creat  Date Value Ref Range Status  05/04/2015 0.82 0.70 - 1.25 mg/dL Final   Creatinine, Ser  Date Value Ref Range Status  04/15/2019 0.78 0.76 - 1.27 mg/dL Final         Passed - K in normal range and within 180 days    Potassium  Date Value Ref Range Status  04/15/2019 4.4 3.5 - 5.2 mmol/L Final         Passed - Patient is not pregnant      Passed - Last BP in normal range    BP Readings from Last 1 Encounters:  04/24/19 139/85         Passed - Valid encounter within last 6 months    Recent Outpatient Visits          5 months ago 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   [facility], Asencion Partridge, MD   5 months ago Bipolar 1 disorder, mixed, moderate (HCC)   [facility], Asencion Partridge, MD   11 months ago Need for prophylactic vaccination and inoculation against influenza   [facility], Meda Coffee, MD   1 year ago Medicare annual wellness visit, subsequent   [facility], Asencion Partridge, MD   1 year ago Need for influenza vaccination   [facility], Asencion Partridge, MD      Future Appointments            In 1 week Neva Seat Asencion Partridge, MD [facility] [place], [facility]

## 2019-09-23 ENCOUNTER — Telehealth: Payer: Self-pay | Admitting: Family Medicine

## 2019-09-23 ENCOUNTER — Telehealth: Payer: Self-pay | Admitting: *Deleted

## 2019-09-23 NOTE — Telephone Encounter (Signed)
Schedule AWV.  

## 2019-09-23 NOTE — Telephone Encounter (Signed)
Pt wife is calling back to sch pts AWV. Please advise.

## 2019-09-30 ENCOUNTER — Other Ambulatory Visit: Payer: Self-pay | Admitting: Family Medicine

## 2019-09-30 DIAGNOSIS — F3162 Bipolar disorder, current episode mixed, moderate: Secondary | ICD-10-CM

## 2019-10-03 ENCOUNTER — Other Ambulatory Visit: Payer: Self-pay

## 2019-10-03 ENCOUNTER — Ambulatory Visit (INDEPENDENT_AMBULATORY_CARE_PROVIDER_SITE_OTHER): Payer: Medicare Other | Admitting: Family Medicine

## 2019-10-03 ENCOUNTER — Encounter: Payer: Self-pay | Admitting: Family Medicine

## 2019-10-03 VITALS — BP 124/84 | HR 95 | Temp 97.3°F | Ht 67.5 in | Wt 206.0 lb

## 2019-10-03 DIAGNOSIS — Z23 Encounter for immunization: Secondary | ICD-10-CM | POA: Diagnosis not present

## 2019-10-03 DIAGNOSIS — I10 Essential (primary) hypertension: Secondary | ICD-10-CM

## 2019-10-03 DIAGNOSIS — F3162 Bipolar disorder, current episode mixed, moderate: Secondary | ICD-10-CM

## 2019-10-03 DIAGNOSIS — R739 Hyperglycemia, unspecified: Secondary | ICD-10-CM | POA: Diagnosis not present

## 2019-10-03 DIAGNOSIS — F17210 Nicotine dependence, cigarettes, uncomplicated: Secondary | ICD-10-CM | POA: Diagnosis not present

## 2019-10-03 NOTE — Patient Instructions (Addendum)
  Blood pressure is controlled today, continue same medication.  I will check some blood work and will also look at your blood sugar on that blood work as it has been slightly elevated previously.  Okay to continue Cymbalta same dose for now, but I do recommend following up with mental health provider as we discussed as medications may need to be changed based on your previous symptoms.  I am glad to hear that you are doing okay for now.  Great job with cutting back on alcohol and tobacco.  Keep up the good work.  Let me know if there are questions, but otherwise will follow up in 6 months.   If you have lab work done today you will be contacted with your lab results within the next 2 weeks.  If you have not heard from [place] then please contact us. The fastest way to get your results is to register for My Chart.   IF you received an x-ray today, you will receive an invoice from [facility]. Please contact [facility] at [phone] with questions or concerns regarding your invoice.   IF you received labwork today, you will receive an invoice from [place]. Please contact [facility] at [phone] with questions or concerns regarding your invoice.   Our billing staff will not be able to assist you with questions regarding bills from these companies.  You will be contacted with the lab results as soon as they are available. The fastest way to get your results is to activate your My Chart account. Instructions are located on the last page of this paperwork. If you have not heard from [place] regarding the results in 2 weeks, please contact this office.

## 2019-10-03 NOTE — Progress Notes (Signed)
Subjective:  Patient ID: Keith Cobb, male    DOB: March 07, 1950  Age: 69 y.o. MRN: [id number]  CC:  Chief Complaint  Patient presents with  . Hypertension    PT reports serious issues with BP since last OV. other than a few high readings here and there. pt reports he sometimes getting dizzy with head movment back when the pt had shingles.     HPI Keith Cobb presents for  Hypertension: Lisinopril 10 mg daily.  Lightheadedness when had shingles, none since.  135-150/80's.   BP Readings from Last 3 Encounters:  10/03/19 124/84  04/24/19 139/85  04/03/19 140/87   Lab Results  Component Value Date   CREATININE 0.78 04/15/2019   Depression: Cymbalta 60mg  QD. Possible Bipolar with mood swings noted in past - plan on psychiatry follow up at [facility] hospital. meds working well.here with spouse - he is doing better. Denies manic symptoms.  Still drinking alcohol - 24 beers every 10 days. Cutting back on alcohol use and smoking. 1 pack every 7 days.   Some decreased motivation at times.    Depression screen [facility] 2/9 10/03/2019 04/24/2019 04/24/2019 04/03/2019 07/16/2018  Decreased Interest 0 0 0 0 0  Down, Depressed, Hopeless 1 0 0 1 0  PHQ - 2 Score 1 0 0 1 0  Altered sleeping - - - - -  Tired, decreased energy - - - - -  Change in appetite - - - - -  Feeling bad or failure about yourself  - - - - -  Trouble concentrating - - - - -  Moving slowly or fidgety/restless - - - - -  Suicidal thoughts - - - - -  PHQ-9 Score - - - - -  Difficult doing work/chores - - - - -   Followed by Dr. 09/16/2018 for L knee pain, possible need for knee replacement.   History Patient Active Problem List   Diagnosis Date Noted  . Smoker 05/04/2015  . HTN (hypertension) 03/19/2012  . Depression 03/19/2012   Past Medical History:  Diagnosis Date  . Anxiety   . Arthritis   . Depression   . Hypertension    No past surgical history on file. Allergies  Allergen Reactions  . Bee Venom    Prior to  Admission medications   Medication Sig Start Date End Date Taking? Authorizing Provider  DULoxetine (CYMBALTA) 60 MG capsule TAKE 1 CAPSULE BY MOUTH TWICE A DAY 09/30/19  Yes 10/02/19, MD  lisinopril (ZESTRIL) 10 MG tablet TAKE 1 TABLET BY MOUTH EVERY DAY FOR BLOOD PRESSURE 09/22/19  Yes 11/22/19, MD   Social History   Socioeconomic History  . Marital status: Married    Spouse name: Not on file  . Number of children: Not on file  . Years of education: Not on file  . Highest education level: Not on file  Occupational History  . Not on file  Tobacco Use  . Smoking status: Current Some Day Smoker    Packs/day: 0.25    Years: 39.00    Pack years: 9.75    Types: Cigarettes  . Smokeless tobacco: Never Used  Substance and Sexual Activity  . Alcohol use: Yes    Alcohol/week: 12.0 standard drinks    Types: 12 Cans of beer per week  . Drug use: No  . Sexual activity: Not on file  Other Topics Concern  . Not on file  Social History Narrative  . Not on file  Social Determinants of Health   Financial Resource Strain:   . Difficulty of Paying Living Expenses: Not on file  Food Insecurity:   . Worried About Programme researcher, broadcasting/film/video in the Last Year: Not on file  . Ran Out of Food in the Last Year: Not on file  Transportation Needs:   . Lack of Transportation (Medical): Not on file  . Lack of Transportation (Non-Medical): Not on file  Physical Activity:   . Days of Exercise per Week: Not on file  . Minutes of Exercise per Session: Not on file  Stress:   . Feeling of Stress : Not on file  Social Connections:   . Frequency of Communication with Friends and Family: Not on file  . Frequency of Social Gatherings with Friends and Family: Not on file  . Attends Religious Services: Not on file  . Active Member of Clubs or Organizations: Not on file  . Attends Banker Meetings: Not on file  . Marital Status: Not on file  Intimate Partner Violence:   . Fear of  Current or Ex-Partner: Not on file  . Emotionally Abused: Not on file  . Physically Abused: Not on file  . Sexually Abused: Not on file    Review of Systems  Constitutional: Negative for fatigue and unexpected weight change.  Eyes: Negative for visual disturbance.  Respiratory: Negative for cough, chest tightness and shortness of breath.   Cardiovascular: Negative for chest pain, palpitations and leg swelling.  Gastrointestinal: Negative for abdominal pain and blood in stool.  Neurological: Negative for dizziness, light-headedness and headaches.     Objective:   Vitals:   10/03/19 1348  BP: 124/84  Pulse: 95  Temp: (!) 97.3 F (36.3 C)  TempSrc: Temporal  SpO2: 97%  Weight: 206 lb (93.4 kg)  Height: 5' 7.5" (1.715 m)     Physical Exam Vitals reviewed.  Constitutional:      Appearance: He is well-developed.  HENT:     Head: Normocephalic and atraumatic.  Eyes:     Pupils: Pupils are equal, round, and reactive to light.  Neck:     Vascular: No carotid bruit or JVD.  Cardiovascular:     Rate and Rhythm: Normal rate and regular rhythm.     Heart sounds: Normal heart sounds. No murmur heard.   Pulmonary:     Effort: Pulmonary effort is normal.     Breath sounds: Normal breath sounds. No rales.  Skin:    General: Skin is warm and dry.  Neurological:     Mental Status: He is alert and oriented to person, place, and time.  Psychiatric:        Attention and Perception: Attention normal.        Mood and Affect: Mood normal.        Speech: Speech is tangential (slight).        Behavior: Behavior is cooperative.        Thought Content: Thought content does not include suicidal ideation.      Assessment & Plan:  Keith Cobb is a 69 y.o. male . Essential hypertension - Plan: Comprehensive metabolic panel  -  Stable, tolerating current regimen. Medications refilled. Labs pending as above.   Need for vaccination - Plan: Flu Vaccine QUAD High  Dose(Fluad)  Hyperglycemia - Plan: Comprehensive metabolic panel, Hemoglobin A1c  -Not fasting today, but will check A1c.  Bipolar 1 disorder, mixed, moderate (HCC)  -Depression versus bipolar disorder with previous mood swings Previously  has thought to have possible bipolar disorder.  Also thought to be complicated by the alcohol addiction/abuse previously.  Substance-induced mood disorder in differential.  Overall reports improved symptoms, denies recent manic symptoms.  Still recommended psychiatry follow-up, will extend Cymbalta prescription for now.  Commended on decreased alcohol use and tobacco use.  No orders of the defined types were placed in this encounter.  Patient Instructions    Blood pressure is controlled today, continue same medication.  I will check some blood work and will also look at your blood sugar on that blood work as it has been slightly elevated previously.  Okay to continue Cymbalta same dose for now, but I do recommend following up with mental health provider as we discussed as medications may need to be changed based on your previous symptoms.  I am glad to hear that you are doing okay for now.  Great job with cutting back on alcohol and tobacco.  Keep up the good work.  Let me know if there are questions, but otherwise will follow up in 6 months.   If you have lab work done today you will be contacted with your lab results within the next 2 weeks.  If you have not heard from [place] then please contact us. The fastest way to get your results is to register for My Chart.   IF you received an x-ray today, you will receive an invoice from [facility]. Please contact [facility] at [phone] with questions or concerns regarding your invoice.   IF you received labwork today, you will receive an invoice from [place]. Please contact [facility] at [phone] with questions or concerns regarding your invoice.   Our billing staff will not be able to  assist you with questions regarding bills from these companies.  You will be contacted with the lab results as soon as they are available. The fastest way to get your results is to activate your My Chart account. Instructions are located on the last page of this paperwork. If you have not heard from [place] regarding the results in 2 weeks, please contact this office.         Signed, Meredith Staggers, MD [facility]

## 2019-10-04 LAB — COMPREHENSIVE METABOLIC PANEL
ALT: 31 IU/L (ref 0–44)
AST: 30 IU/L (ref 0–40)
Albumin/Globulin Ratio: 1.6 (ref 1.2–2.2)
Albumin: 4.4 g/dL (ref 3.8–4.8)
Alkaline Phosphatase: 80 IU/L (ref 44–121)
BUN/Creatinine Ratio: 11 (ref 10–24)
BUN: 9 mg/dL (ref 8–27)
Bilirubin Total: 1.5 mg/dL — ABNORMAL HIGH (ref 0.0–1.2)
CO2: 26 mmol/L (ref 20–29)
Calcium: 9.4 mg/dL (ref 8.6–10.2)
Chloride: 99 mmol/L (ref 96–106)
Creatinine, Ser: 0.83 mg/dL (ref 0.76–1.27)
GFR calc Af Amer: 105 mL/min/{1.73_m2} (ref >59)
GFR calc non Af Amer: 90 mL/min/{1.73_m2} (ref >59)
Globulin, Total: 2.8 g/dL (ref 1.5–4.5)
Glucose: 106 mg/dL — ABNORMAL HIGH (ref 65–99)
Potassium: 4.8 mmol/L (ref 3.5–5.2)
Sodium: 139 mmol/L (ref 134–144)
Total Protein: 7.2 g/dL (ref 6.0–8.5)

## 2019-10-04 LAB — HEMOGLOBIN A1C
Est. average glucose Bld gHb Est-mCnc: 114 mg/dL
Hgb A1c MFr Bld: 5.6 % (ref 4.8–5.6)

## 2019-10-10 ENCOUNTER — Encounter: Payer: Self-pay | Admitting: Family Medicine

## 2019-10-10 NOTE — Telephone Encounter (Signed)
Pt notes having some dizzy spells with some high BP readings, notes it is infrequent but seems to happen when he is going to bed any recommendations we can make for him?

## 2019-10-10 NOTE — Telephone Encounter (Signed)
Or eval in office tomorrow with any provider if persistent dizziness.

## 2019-10-10 NOTE — Telephone Encounter (Signed)
Did not discuss dizziness last visit. More information needed on how high his blood pressure goes. Was controlled at last visit. If dizziness with head movement, then could be middle ear but would recommend eval in office early next week. ER/Urgent care over the weekend if any worsening symptoms.

## 2019-10-29 ENCOUNTER — Encounter: Payer: Self-pay | Admitting: Registered Nurse

## 2019-10-29 ENCOUNTER — Other Ambulatory Visit: Payer: Self-pay

## 2019-10-29 ENCOUNTER — Ambulatory Visit (INDEPENDENT_AMBULATORY_CARE_PROVIDER_SITE_OTHER): Payer: Medicare Other | Admitting: Registered Nurse

## 2019-10-29 VITALS — BP 122/79 | HR 89 | Temp 98.0°F | Resp 17 | Ht 67.5 in | Wt 203.8 lb

## 2019-10-29 DIAGNOSIS — L089 Local infection of the skin and subcutaneous tissue, unspecified: Secondary | ICD-10-CM | POA: Diagnosis not present

## 2019-10-29 NOTE — Patient Instructions (Signed)
° ° ° °  If you have lab work done today you will be contacted with your lab results within the next 2 weeks.  If you have not heard from us then please contact us. The fastest way to get your results is to register for My Chart. ° ° °IF you received an x-ray today, you will receive an invoice from [facility]. Please contact [facility] at [phone] with questions or concerns regarding your invoice.  ° °IF you received labwork today, you will receive an invoice from [facility]. Please contact [facility] at [phone] with questions or concerns regarding your invoice.  ° °Our billing staff will not be able to assist you with questions regarding bills from these companies. ° °You will be contacted with the lab results as soon as they are available. The fastest way to get your results is to activate your My Chart account. Instructions are located on the last page of this paperwork. If you have not heard from us regarding the results in 2 weeks, please contact this office. °  ° ° ° °

## 2019-10-30 MED ORDER — DOXYCYCLINE HYCLATE 100 MG PO TABS: 100.0000 mg | ORAL_TABLET | Freq: Two times a day (BID) | ORAL | 0 refills | Status: DC

## 2019-10-30 NOTE — Telephone Encounter (Signed)
patient is calling back looking for his antibiotic that pt states he was supposed to start yesterday after his visit  / unable to locate medicine in epic

## 2019-10-30 NOTE — Telephone Encounter (Signed)
Spoke with Keith Cobb and he is sending the mediation to the pharmacy.

## 2019-12-16 ENCOUNTER — Other Ambulatory Visit: Payer: Self-pay | Admitting: Family Medicine

## 2019-12-16 DIAGNOSIS — I10 Essential (primary) hypertension: Secondary | ICD-10-CM

## 2020-01-14 ENCOUNTER — Encounter: Payer: Self-pay | Admitting: Registered Nurse

## 2020-01-14 NOTE — Progress Notes (Signed)
Acute Office Visit  Subjective:    Patient ID: Keith Cobb, male    DOB: 1950-10-06, 70 y.o.   MRN: [id number]  Chief Complaint  Patient presents with  . Foot Pain    Patient states he has been having left toe pain for about two weeks . Per patient he thinks he was bit by an spider because it not in that much pain but keeps getting purple.    HPI Patient is in today for L great toe pain  Swollen and tender x 2 weeks Improving from a pain perspective but redness/purple color is persistent Some swelling in lower extremity No heat No hx of dvt No limits to ROM No purulent drainage  Past Medical History:  Diagnosis Date  . Anxiety   . Arthritis   . Depression   . Hypertension     No past surgical history on file.  Family History  Problem Relation Age of Onset  . Stroke Father     Social History   Socioeconomic History  . Marital status: Married    Spouse name: Not on file  . Number of children: Not on file  . Years of education: Not on file  . Highest education level: Not on file  Occupational History  . Not on file  Tobacco Use  . Smoking status: Current Some Day Smoker    Packs/day: 0.25    Years: 39.00    Pack years: 9.75    Types: Cigarettes  . Smokeless tobacco: Never Used  Substance and Sexual Activity  . Alcohol use: Yes    Alcohol/week: 12.0 standard drinks    Types: 12 Cans of beer per week  . Drug use: No  . Sexual activity: Not on file  Other Topics Concern  . Not on file  Social History Narrative  . Not on file   Social Determinants of Health   Financial Resource Strain: Not on file  Food Insecurity: Not on file  Transportation Needs: Not on file  Physical Activity: Not on file  Stress: Not on file  Social Connections: Not on file  Intimate Partner Violence: Not on file    Outpatient Medications Prior to Visit  Medication Sig Dispense Refill  . DULoxetine (CYMBALTA) 60 MG capsule TAKE 1 CAPSULE BY MOUTH TWICE A DAY 180  capsule 1  . lisinopril (ZESTRIL) 10 MG tablet TAKE 1 TABLET BY MOUTH EVERY DAY FOR BLOOD PRESSURE 90 tablet 0   No facility-administered medications prior to visit.    Allergies  Allergen Reactions  . Bee Venom     Review of Systems  Constitutional: Negative.   HENT: Negative.   Eyes: Negative.   Respiratory: Negative.   Cardiovascular: Negative.   Gastrointestinal: Negative.   Genitourinary: Negative.   Musculoskeletal: Negative.   Skin: Negative.   Neurological: Negative.   Psychiatric/Behavioral: Negative.   All other systems reviewed and are negative.      Objective:    Physical Exam Vitals and nursing note reviewed.  Constitutional:      Appearance: Normal appearance.  Cardiovascular:     Rate and Rhythm: Normal rate and regular rhythm.     Pulses: Normal pulses.     Heart sounds: Normal heart sounds. No murmur heard. No friction rub. No gallop.   Pulmonary:     Effort: Pulmonary effort is normal. No respiratory distress.     Breath sounds: Normal breath sounds. No stridor. No wheezing, rhonchi or rales.  Musculoskeletal:  General: No swelling, tenderness, deformity or signs of injury. Normal range of motion.     Right lower leg: No edema.     Left lower leg: No edema.  Skin:    General: Skin is warm and dry.     Coloration: Skin is not jaundiced or pale.     Findings: Bruising, erythema and lesion present. No rash.  Neurological:     General: No focal deficit present.     Mental Status: He is alert. Mental status is at baseline.  Psychiatric:        Mood and Affect: Mood normal.        Behavior: Behavior normal.        Thought Content: Thought content normal.        Judgment: Judgment normal.     BP 122/79   Pulse 89   Temp 98 F (36.7 C) (Temporal)   Resp 17   Ht 5' 7.5" (1.715 m)   Wt 203 lb 12.8 oz (92.4 kg)   SpO2 96%   BMI 31.45 kg/m  Wt Readings from Last 3 Encounters:  10/29/19 203 lb 12.8 oz (92.4 kg)  10/03/19 206 lb (93.4  kg)  04/24/19 211 lb (95.7 kg)    There are no preventive care reminders to display for this patient.  There are no preventive care reminders to display for this patient.   Lab Results  Component Value Date   TSH 1.670 07/16/2018   Lab Results  Component Value Date   WBC 5.9 05/19/2017   HGB 16.4 05/19/2017   HCT 47.2 05/19/2017   MCV 90 05/19/2017   PLT 211 05/19/2017   Lab Results  Component Value Date   NA 139 10/03/2019   K 4.8 10/03/2019   CO2 26 10/03/2019   GLUCOSE 106 (H) 10/03/2019   BUN 9 10/03/2019   CREATININE 0.83 10/03/2019   BILITOT 1.5 (H) 10/03/2019   ALKPHOS 80 10/03/2019   AST 30 10/03/2019   ALT 31 10/03/2019   PROT 7.2 10/03/2019   ALBUMIN 4.4 10/03/2019   CALCIUM 9.4 10/03/2019   Lab Results  Component Value Date   CHOL 155 05/19/2017   Lab Results  Component Value Date   HDL 54 05/19/2017   Lab Results  Component Value Date   LDLCALC 77 05/19/2017   Lab Results  Component Value Date   TRIG 119 05/19/2017   Lab Results  Component Value Date   CHOLHDL 2.9 05/19/2017   Lab Results  Component Value Date   HGBA1C 5.6 10/03/2019       Assessment & Plan:   Problem List Items Addressed This Visit   None   Visit Diagnoses    Skin infection    -  Primary   Relevant Medications   doxycycline (VIBRA-TABS) 100 MG tablet       Meds ordered this encounter  Medications  . doxycycline (VIBRA-TABS) 100 MG tablet    Sig: Take 1 tablet (100 mg total) by mouth 2 (two) times daily.    Dispense:  10 tablet    Refill:  0    Order Specific Question:   Supervising Provider    Answer:   Neva Seat, JEFFREY R [[id number]]   PLAN  Feel more likely cellulitis than insect bite but regardless will give short course of doxy  Return prn  Potential for some vascular disease occurring but doubt DVT  Patient encouraged to call clinic with any questions, comments, or concerns.   Janeece Agee, NP

## 2020-02-20 DIAGNOSIS — M79675 Pain in left toe(s): Secondary | ICD-10-CM | POA: Diagnosis not present

## 2020-02-20 DIAGNOSIS — M1712 Unilateral primary osteoarthritis, left knee: Secondary | ICD-10-CM | POA: Diagnosis not present

## 2020-02-20 DIAGNOSIS — M79674 Pain in right toe(s): Secondary | ICD-10-CM | POA: Diagnosis not present

## 2020-03-07 DIAGNOSIS — M79675 Pain in left toe(s): Secondary | ICD-10-CM | POA: Diagnosis not present

## 2020-03-11 ENCOUNTER — Other Ambulatory Visit: Payer: Self-pay | Admitting: Family Medicine

## 2020-03-11 DIAGNOSIS — I10 Essential (primary) hypertension: Secondary | ICD-10-CM

## 2020-03-24 DIAGNOSIS — M1712 Unilateral primary osteoarthritis, left knee: Secondary | ICD-10-CM | POA: Diagnosis not present

## 2020-04-03 ENCOUNTER — Telehealth: Payer: Self-pay | Admitting: Family Medicine

## 2020-04-03 ENCOUNTER — Other Ambulatory Visit: Payer: Self-pay | Admitting: Family Medicine

## 2020-04-03 DIAGNOSIS — F3162 Bipolar disorder, current episode mixed, moderate: Secondary | ICD-10-CM

## 2020-04-03 NOTE — Telephone Encounter (Signed)
Requested medication (s) are due for refill today: yes  Requested medication (s) are on the active medication list: yes  Last refill:  12/31/20  Future visit scheduled: no  Notes to clinic: previously seen by Dr Neva Seat; pt was to call back and schedule follow up appt    Requested Prescriptions  Pending Prescriptions Disp Refills   DULoxetine (CYMBALTA) 60 MG capsule [Pharmacy Med Name: DULOXETINE HCL DR 60 MG CAP] 180 capsule 1    Sig: TAKE 1 CAPSULE BY MOUTH TWICE A DAY      Psychiatry: Antidepressants - SNRI Passed - 04/03/2020 11:37 AM      Passed - Completed PHQ-2 or PHQ-9 in the last 360 days      Passed - Last BP in normal range    BP Readings from Last 1 Encounters:  10/29/19 122/79          Passed - Valid encounter within last 6 months    Recent Outpatient Visits           5 months ago Skin infection   [facility], Gerlene Burdock, NP   6 months ago Essential hypertension   [facility], Asencion Partridge, MD   11 months ago 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   [facility], Asencion Partridge, MD   1 year ago Bipolar 1 disorder, mixed, moderate (HCC)   [facility], Asencion Partridge, MD   1 year ago Need for prophylactic vaccination and inoculation against influenza   [facility] at [facility], Meda Coffee, MD

## 2020-04-03 NOTE — Telephone Encounter (Signed)
Patient is requesting a refill of the following medications: Requested Prescriptions   Pending Prescriptions Disp Refills  . DULoxetine (CYMBALTA) 60 MG capsule [Pharmacy Med Name: DULOXETINE HCL DR 60 MG CAP] 180 capsule 1    Sig: TAKE 1 CAPSULE BY MOUTH TWICE A DAY    Date of patient request: 04/03/2020 Last office visit: 10/29/2019 Date of last refill: 09/30/2019 Last refill amount: 180 tablets 1 refill Follow up time period per chart: none

## 2020-04-03 NOTE — Telephone Encounter (Signed)
Verified rx with patient.  Taking cymbalta 60mg  BID - working well.  Plans on follow up with me in next few months at new practice.

## 2020-04-07 DIAGNOSIS — M1712 Unilateral primary osteoarthritis, left knee: Secondary | ICD-10-CM | POA: Diagnosis not present

## 2020-04-14 DIAGNOSIS — M1712 Unilateral primary osteoarthritis, left knee: Secondary | ICD-10-CM | POA: Diagnosis not present

## 2020-04-21 DIAGNOSIS — M1712 Unilateral primary osteoarthritis, left knee: Secondary | ICD-10-CM | POA: Diagnosis not present

## 2020-04-28 ENCOUNTER — Telehealth: Payer: Self-pay | Admitting: Family Medicine

## 2020-04-28 NOTE — Telephone Encounter (Signed)
Soni the NP with [facility] called in stating that she saw pt at his home today. She did a P80 screening in which is the right leg was abnormal. She wanted to report this back to Dr. Neva Seat.  Please advise if pt needs an f/up with our office or with a specialist.   Romilda Garret can be reached at [phone] for more information.

## 2020-05-03 NOTE — Telephone Encounter (Signed)
Please obtain the details of that screening, but I suspect it was a PAD screening.  Depending on those results can certainly follow-up or can meet with vascular specialist for more detailed evaluation.

## 2020-05-05 NOTE — Telephone Encounter (Signed)
Called Soni and LM for her to return call to discuss details as requested. Will await a call back

## 2020-05-05 NOTE — Telephone Encounter (Signed)
Please call back at [phone]

## 2020-05-07 NOTE — Telephone Encounter (Signed)
This was in fact a PAD screening

## 2020-05-07 NOTE — Telephone Encounter (Signed)
Can refer to vascular if he would like to review screening - may need formal ABI, but would recommend in office discussion first.

## 2020-05-08 NOTE — Telephone Encounter (Signed)
Pt has been scheduled for 05/15/20 with Neva Seat

## 2020-05-08 NOTE — Telephone Encounter (Signed)
Pt needs an appointment to discuss recent screening and possible referral please

## 2020-05-15 ENCOUNTER — Ambulatory Visit (INDEPENDENT_AMBULATORY_CARE_PROVIDER_SITE_OTHER): Payer: Medicare Other | Admitting: Family Medicine

## 2020-05-15 ENCOUNTER — Encounter: Payer: Self-pay | Admitting: Family Medicine

## 2020-05-15 ENCOUNTER — Other Ambulatory Visit: Payer: Self-pay

## 2020-05-15 VITALS — BP 114/78 | HR 74 | Temp 98.2°F | Resp 18 | Ht 67.5 in | Wt 211.0 lb

## 2020-05-15 DIAGNOSIS — M25562 Pain in left knee: Secondary | ICD-10-CM | POA: Diagnosis not present

## 2020-05-15 DIAGNOSIS — Z720 Tobacco use: Secondary | ICD-10-CM | POA: Diagnosis not present

## 2020-05-15 DIAGNOSIS — L819 Disorder of pigmentation, unspecified: Secondary | ICD-10-CM | POA: Diagnosis not present

## 2020-05-15 NOTE — Patient Instructions (Addendum)
Keep up the good work with quitting smoking - let me know if we can help.  Quitting is very important if you do have peripheral artery disease. I will refer you for other testing with vein specialist.   Please follow up in 6 weeks to review meds.   Return to the clinic or go to the nearest emergency room if any of your symptoms worsen or new symptoms occur.

## 2020-05-15 NOTE — Progress Notes (Signed)
Subjective:  Patient ID: Keith Cobb, male    DOB: [birth year]  Age: 70 y.o. MRN: [id number]  CC:  Chief Complaint  Patient presents with  . Follow-up    Pt had in home check by nurse for blood flow, appears his was decreased or abnormal and pt requested that it be checked out by Dr or specialty if necessary.   . Form Completion    Pt here for a handicap placard completion as well today due to limited mobility     HPI Keith Cobb presents for   Possible peripheral arterial disease See previous telephone note.  Had home nurse screening that was positive for PAD.He does have a history of hypertension, does smoke cigarettes. BP Readings from Last 3 Encounters:  05/15/20 114/78  10/29/19 122/79  10/03/19 124/84  no toe numbness/tingling.  Purple discoloration in left toe at times. No wounds/ulcers.  No chest pain.  Tobacco - 1 pack per week. Trying to cut back.  Denies calf pain/claudication.   Left knee pain: With arthritis, followed by Dr. Turner Daniels with orthopedics, knee replacement has been discussed previously. Has received multiple injections - will see how 3 injection tx will do.  Knee pain limits mobility, needs handicap placard.   History Patient Active Problem List   Diagnosis Date Noted  . Smoker 05/04/2015  . HTN (hypertension) 03/19/2012  . Depression 03/19/2012   Past Medical History:  Diagnosis Date  . Anxiety   . Arthritis   . Depression   . Hypertension    No past surgical history on file. Allergies  Allergen Reactions  . Bee Venom    Prior to Admission medications   Medication Sig Start Date End Date Taking? Authorizing Provider  DULoxetine (CYMBALTA) 60 MG capsule TAKE 1 CAPSULE BY MOUTH TWICE A DAY 04/03/20  Yes [name], MD  lisinopril (ZESTRIL) 10 MG tablet TAKE 1 TABLET BY MOUTH EVERY DAY FOR BLOOD PRESSURE 03/11/20  Yes [name], MD   Social History   Socioeconomic History  . Marital status: Married    Spouse name: Not  on file  . Number of children: Not on file  . Years of education: Not on file  . Highest education level: Not on file  Occupational History  . Not on file  Tobacco Use  . Smoking status: Current Some Day Smoker    Packs/day: 0.25    Years: 39.00    Pack years: 9.75    Types: Cigarettes  . Smokeless tobacco: Never Used  Substance and Sexual Activity  . Alcohol use: Yes    Alcohol/week: 12.0 standard drinks    Types: 12 Cans of beer per week  . Drug use: No  . Sexual activity: Not on file  Other Topics Concern  . Not on file  Social History Narrative  . Not on file   Social Determinants of Health   Financial Resource Strain: Not on file  Food Insecurity: Not on file  Transportation Needs: Not on file  Physical Activity: Not on file  Stress: Not on file  Social Connections: Not on file  Intimate Partner Violence: Not on file    Review of Systems  Per HPI.  Objective:   Vitals:   05/15/20 1123  BP: 114/78  Pulse: 74  Resp: 18  Temp: 98.2 F (36.8 C)  TempSrc: Temporal  SpO2: 95%  Weight: 211 lb (95.7 kg)  Height: 5' 7.5" (1.715 m)     Physical Exam Vitals reviewed.  Constitutional:      Appearance: He is well-developed.  HENT:     Head: Normocephalic and atraumatic.  Eyes:     Pupils: Pupils are equal, round, and reactive to light.  Neck:     Vascular: No carotid bruit or JVD.  Cardiovascular:     Rate and Rhythm: Normal rate and regular rhythm.     Heart sounds: No murmur heard.   Pulmonary:     Effort: Pulmonary effort is normal.     Breath sounds: Normal breath sounds. No rales.  Musculoskeletal:     Comments: Left knee with effusion, antalgic gait.  Skin:    General: Skin is warm and dry.     Comments: Slight cyanotic appearance of the left great toe but cap refill 1 to 2 seconds.  Sensation intact.  No wounds, ulcerations, no other cyanosis seen.  Calves nontender, no swelling.  Neurological:     Mental Status: He is alert and oriented to  person, place, and time.      Assessment & Plan:  Keith Cobb is a 70 y.o. male . Tobacco abuse - Plan: Ambulatory referral to Vascular Surgery Discoloration of skin of toe - Plan: Ambulatory referral to Vascular Surgery  -Suspicious for peripheral arterial disease.  Refer to vascular specialist for ABIs, further evaluation.  No critical limb ischemia appreciated at this time.  Importance of tobacco cessation discussed.  Left knee pain, unspecified chronicity  -Followed by orthopedics, limiting long distances, handicap placard completed.  Continue follow-up with Ortho  No orders of the defined types were placed in this encounter.  Patient Instructions  Keep up the good work with quitting smoking - let me know if we can help.  Quitting is very important if you do have peripheral artery disease. I will refer you for other testing with vein specialist.   Please follow up in 6 weeks to review meds.   Return to the clinic or go to the nearest emergency room if any of your symptoms worsen or new symptoms occur.        Signed, Meredith Staggers, MD [facility]

## 2020-05-22 ENCOUNTER — Other Ambulatory Visit: Payer: Self-pay | Admitting: *Deleted

## 2020-05-22 DIAGNOSIS — M25569 Pain in unspecified knee: Secondary | ICD-10-CM

## 2020-06-04 ENCOUNTER — Encounter: Payer: Self-pay | Admitting: Family Medicine

## 2020-06-04 ENCOUNTER — Other Ambulatory Visit: Payer: Self-pay

## 2020-06-04 DIAGNOSIS — F3162 Bipolar disorder, current episode mixed, moderate: Secondary | ICD-10-CM

## 2020-06-04 DIAGNOSIS — Z720 Tobacco use: Secondary | ICD-10-CM | POA: Insufficient documentation

## 2020-06-04 DIAGNOSIS — Z7189 Other specified counseling: Secondary | ICD-10-CM | POA: Insufficient documentation

## 2020-06-04 DIAGNOSIS — I10 Essential (primary) hypertension: Secondary | ICD-10-CM

## 2020-06-04 DIAGNOSIS — H903 Sensorineural hearing loss, bilateral: Secondary | ICD-10-CM | POA: Insufficient documentation

## 2020-06-04 DIAGNOSIS — M1712 Unilateral primary osteoarthritis, left knee: Secondary | ICD-10-CM | POA: Insufficient documentation

## 2020-06-04 MED ORDER — DULOXETINE HCL 60 MG PO CPEP: 60.0000 mg | ORAL_CAPSULE | Freq: Two times a day (BID) | ORAL | 0 refills | Status: DC

## 2020-06-04 MED ORDER — LISINOPRIL 10 MG PO TABS: ORAL_TABLET | ORAL | 0 refills | Status: DC

## 2020-06-05 ENCOUNTER — Other Ambulatory Visit: Payer: Self-pay

## 2020-06-05 ENCOUNTER — Ambulatory Visit (HOSPITAL_COMMUNITY)
Admission: RE | Admit: 2020-06-05 | Discharge: 2020-06-05 | Disposition: A | Discharge status: Home / Self Care | Payer: Medicare Other | Source: Ambulatory Visit | Attending: Vascular Surgery | Admitting: Vascular Surgery

## 2020-06-05 ENCOUNTER — Ambulatory Visit: Payer: Medicare Other | Admitting: Physician Assistant

## 2020-06-05 VITALS — BP 129/87 | HR 87 | Temp 97.3°F | Resp 18 | Ht 71.0 in | Wt 209.1 lb

## 2020-06-05 DIAGNOSIS — I739 Peripheral vascular disease, unspecified: Secondary | ICD-10-CM

## 2020-06-05 DIAGNOSIS — K439 Ventral hernia without obstruction or gangrene: Secondary | ICD-10-CM | POA: Diagnosis not present

## 2020-06-05 DIAGNOSIS — M25569 Pain in unspecified knee: Secondary | ICD-10-CM | POA: Diagnosis not present

## 2020-06-05 NOTE — Progress Notes (Signed)
History of Present Illness:  Patient is a 70 y.o. year old male who presents for evaluation after a home vascular evaluation by there insurance company.  He was said to have abnormal arterial flow to his lower extremities and a purlplish discoloration to his left GT.    He denise symptoms of claudication, non healing wounds or rest pain.  He can usually walk as far as he likes until recently he has had sever left know OA symptoms.    Past Medical History:  Diagnosis Date  . Anxiety   . Arthritis   . Depression   . Hypertension     No past surgical history on file.  ROS:   General:  No weight loss, Fever, chills  HEENT: No recent headaches, no nasal bleeding, no visual changes, no sore throat  Neurologic: No dizziness, blackouts, seizures. No recent symptoms of stroke or mini- stroke. No recent episodes of slurred speech, or temporary blindness.  Cardiac: No recent episodes of chest pain/pressure, no shortness of breath at rest.  No shortness of breath with exertion.  Denies history of atrial fibrillation or irregular heartbeat  Vascular: No history of rest pain in feet.  No history of claudication.  No history of non-healing ulcer, No history of DVT   Pulmonary: No home oxygen, no productive cough, no hemoptysis,  No asthma or wheezing  Musculoskeletal:  [ ]  Arthritis, [ ]  Low back pain,  [ ]  Joint pain  Hematologic:No history of hypercoagulable state.  No history of easy bleeding.  No history of anemia  Gastrointestinal: No hematochezia or melena,  No gastroesophageal reflux, no trouble swallowing  Urinary: [ ]  chronic Kidney disease, [ ]  on HD - [ ]  MWF or [ ]  TTHS, [ ]  Burning with urination, [ ]  Frequent urination, [ ]  Difficulty urinating;   Skin: No rashes  Psychological: No history of anxiety,  No history of depression  Social History Social History   Tobacco Use  . Smoking status: Current Some Day Smoker    Packs/day: 0.25    Years: 39.00    Pack years:  9.75    Types: Cigarettes  . Smokeless tobacco: Never Used  Substance Use Topics  . Alcohol use: Yes    Alcohol/week: 12.0 standard drinks    Types: 12 Cans of beer per week  . Drug use: No    Family History Family History  Problem Relation Age of Onset  . Stroke Father     Allergies  Allergies  Allergen Reactions  . Bee Venom      Current Outpatient Medications  Medication Sig Dispense Refill  . DULoxetine (CYMBALTA) 60 MG capsule Take 1 capsule (60 mg total) by mouth 2 (two) times daily. 180 capsule 0  . lisinopril (ZESTRIL) 10 MG tablet TAKE 1 TABLET BY MOUTH EVERY DAY FOR BLOOD PRESSURE 90 tablet 0   No current facility-administered medications for this visit.    Physical Examination  Vitals:   06/05/20 1321  BP: 129/87  Pulse: 87  Resp: 18  Temp: (!) 97.3 F (36.3 C)  TempSrc: Temporal  SpO2: 98%  Weight: 209 lb 1.6 oz (94.8 kg)  Height: 5\' 11"  (1.803 m)    Body mass index is 29.16 kg/m.  General:  Alert and oriented, no acute distress HEENT: Normal Neck: No bruit or JVD Pulmonary: Clear to auscultation bilaterally Cardiac: Regular Rate and Rhythm without murmur Abdomen: Soft, non-tender, non-distended, no mass, no scars.  Central abdominal hernia soft and compressible.  Skin: No rash, GT toe with mild bluish discolor, brisk cap refill no open wounds Extremity Pulses:  2+ radial, brachial, femoral, dorsalis pedis, posterior tibial pulses bilaterally Musculoskeletal: No deformity or edema  Neurologic: Upper and lower extremity motor 5/5 and symmetric  DATA:    ABI Findings:  +---------+------------------+-----+---------+--------+  Right  Rt Pressure (mmHg)IndexWaveform Comment   +---------+------------------+-----+---------+--------+  Brachial 134                      +---------+------------------+-----+---------+--------+  ATA   155        1.12             +---------+------------------+-----+---------+--------+  PTA   157        1.13 triphasic      +---------+------------------+-----+---------+--------+  DP                triphasic      +---------+------------------+-----+---------+--------+  Great Toe78        0.56            +---------+------------------+-----+---------+--------+   +---------+------------------+-----+---------+-------+  Left   Lt Pressure (mmHg)IndexWaveform Comment  +---------+------------------+-----+---------+-------+  Brachial 139                      +---------+------------------+-----+---------+-------+  ATA   163        1.17            +---------+------------------+-----+---------+-------+  PTA   136        0.98 triphasic      +---------+------------------+-----+---------+-------+  DP                triphasic      +---------+------------------+-----+---------+-------+  Great Toe114        0.82            +---------+------------------+-----+---------+-------+   +-------+-----------+-----------+------------+------------+  ABI/TBIToday's ABIToday's TBIPrevious ABIPrevious TBI  +-------+-----------+-----------+------------+------------+  Right 1.13    0.56                  +-------+-----------+-----------+------------+------------+  Left  1.17    0.82                  +-------+-----------+-----------+------------+------------+      Summary:  Right: Resting right ankle-brachial index is within normal range. No  evidence of significant right lower extremity arterial disease. The right  toe-brachial index is abnormal. RT great toe pressure = 78 mmHg.   Left: Resting left ankle-brachial index is within normal range. No  evidence of significant left lower extremity arterial  disease. The left  toe-brachial index is normal. LT Great toe pressure = 114 mmHg.    ASSESSMENT/PLAN:  PAD with calcified lower extremity arteries He has easily palpable DP/PT pulses B LE without history of claudication, rest pain or non healing wounds. His ABI are normal with triphasic blood flow.  He is not at risk of toe or limb loss.  Activity as tolerates  I did reveal an abdominal central hernia without symptoms of incarceration.  It is NTTP.  I will refer follow up to his PCP.     Mosetta Pigeon PA-C [facility] Office: [phone]  MD in clinic [place]

## 2020-06-09 ENCOUNTER — Other Ambulatory Visit: Payer: Self-pay | Admitting: Family Medicine

## 2020-06-09 DIAGNOSIS — I10 Essential (primary) hypertension: Secondary | ICD-10-CM

## 2020-06-24 ENCOUNTER — Ambulatory Visit: Payer: Medicare Other | Admitting: Family Medicine

## 2020-06-25 ENCOUNTER — Other Ambulatory Visit: Payer: Self-pay | Admitting: Family Medicine

## 2020-06-25 ENCOUNTER — Ambulatory Visit: Payer: Medicare Other | Admitting: Family Medicine

## 2020-06-25 DIAGNOSIS — I10 Essential (primary) hypertension: Secondary | ICD-10-CM

## 2020-07-07 ENCOUNTER — Other Ambulatory Visit: Payer: Self-pay | Admitting: Family Medicine

## 2020-07-07 DIAGNOSIS — I10 Essential (primary) hypertension: Secondary | ICD-10-CM

## 2020-07-08 ENCOUNTER — Other Ambulatory Visit: Payer: Self-pay | Admitting: Family Medicine

## 2020-07-08 DIAGNOSIS — F3162 Bipolar disorder, current episode mixed, moderate: Secondary | ICD-10-CM

## 2020-07-11 ENCOUNTER — Other Ambulatory Visit: Payer: Self-pay | Admitting: Family Medicine

## 2020-07-11 DIAGNOSIS — I10 Essential (primary) hypertension: Secondary | ICD-10-CM

## 2020-07-15 DIAGNOSIS — H0102B Squamous blepharitis left eye, upper and lower eyelids: Secondary | ICD-10-CM | POA: Diagnosis not present

## 2020-07-15 DIAGNOSIS — H04123 Dry eye syndrome of bilateral lacrimal glands: Secondary | ICD-10-CM | POA: Diagnosis not present

## 2020-07-15 DIAGNOSIS — H2513 Age-related nuclear cataract, bilateral: Secondary | ICD-10-CM | POA: Diagnosis not present

## 2020-07-15 DIAGNOSIS — H52223 Regular astigmatism, bilateral: Secondary | ICD-10-CM | POA: Diagnosis not present

## 2020-07-15 DIAGNOSIS — H5213 Myopia, bilateral: Secondary | ICD-10-CM | POA: Diagnosis not present

## 2020-07-15 DIAGNOSIS — H0102A Squamous blepharitis right eye, upper and lower eyelids: Secondary | ICD-10-CM | POA: Diagnosis not present

## 2020-07-15 DIAGNOSIS — H35033 Hypertensive retinopathy, bilateral: Secondary | ICD-10-CM | POA: Diagnosis not present

## 2020-07-15 DIAGNOSIS — B0239 Other herpes zoster eye disease: Secondary | ICD-10-CM | POA: Diagnosis not present

## 2020-07-15 DIAGNOSIS — H40053 Ocular hypertension, bilateral: Secondary | ICD-10-CM | POA: Diagnosis not present

## 2020-07-15 DIAGNOSIS — H524 Presbyopia: Secondary | ICD-10-CM | POA: Diagnosis not present

## 2020-07-15 DIAGNOSIS — H179 Unspecified corneal scar and opacity: Secondary | ICD-10-CM | POA: Diagnosis not present

## 2020-07-23 ENCOUNTER — Ambulatory Visit: Payer: Medicare Other | Admitting: Family Medicine

## 2020-08-06 ENCOUNTER — Other Ambulatory Visit: Payer: Self-pay | Admitting: Family Medicine

## 2020-08-06 DIAGNOSIS — I10 Essential (primary) hypertension: Secondary | ICD-10-CM

## 2020-08-20 ENCOUNTER — Other Ambulatory Visit: Payer: Self-pay | Admitting: Family Medicine

## 2020-08-20 DIAGNOSIS — I10 Essential (primary) hypertension: Secondary | ICD-10-CM

## 2020-09-10 ENCOUNTER — Other Ambulatory Visit: Payer: Self-pay | Admitting: Family Medicine

## 2020-09-10 DIAGNOSIS — I10 Essential (primary) hypertension: Secondary | ICD-10-CM

## 2020-09-10 DIAGNOSIS — F3162 Bipolar disorder, current episode mixed, moderate: Secondary | ICD-10-CM

## 2020-09-20 ENCOUNTER — Other Ambulatory Visit: Payer: Self-pay | Admitting: Family Medicine

## 2020-09-20 DIAGNOSIS — F3162 Bipolar disorder, current episode mixed, moderate: Secondary | ICD-10-CM

## 2020-09-20 DIAGNOSIS — I10 Essential (primary) hypertension: Secondary | ICD-10-CM

## 2020-09-21 ENCOUNTER — Other Ambulatory Visit: Payer: Self-pay | Admitting: Family Medicine

## 2020-09-21 DIAGNOSIS — F3162 Bipolar disorder, current episode mixed, moderate: Secondary | ICD-10-CM

## 2020-09-21 MED ORDER — LISINOPRIL 10 MG PO TABS: ORAL_TABLET | ORAL | 0 refills | Status: DC

## 2020-09-21 NOTE — Telephone Encounter (Signed)
Patient is requesting a refill of the following medications: Requested Prescriptions   Pending Prescriptions Disp Refills   DULoxetine (CYMBALTA) 60 MG capsule 180 capsule 0    Sig: Take 1 capsule (60 mg total) by mouth 2 (two) times daily.    Date of patient request: 09/20/2020 Last office visit: 05/15/2020 Date of last refill: 06/04/2020 Last refill amount:180 capsules Follow up time period per chart: N/A

## 2020-09-22 NOTE — Telephone Encounter (Signed)
Appears to be duplicate request  appears to have been reordered yesterday. Let me know if not.

## 2020-09-23 ENCOUNTER — Other Ambulatory Visit: Payer: Self-pay | Admitting: Family Medicine

## 2020-09-23 DIAGNOSIS — I10 Essential (primary) hypertension: Secondary | ICD-10-CM

## 2020-10-11 ENCOUNTER — Other Ambulatory Visit: Payer: Self-pay | Admitting: Family Medicine

## 2020-10-11 DIAGNOSIS — I10 Essential (primary) hypertension: Secondary | ICD-10-CM

## 2020-11-09 ENCOUNTER — Ambulatory Visit (INDEPENDENT_AMBULATORY_CARE_PROVIDER_SITE_OTHER): Payer: Medicare Other | Admitting: *Deleted

## 2020-11-09 DIAGNOSIS — Z0000 Encounter for general adult medical examination without abnormal findings: Secondary | ICD-10-CM | POA: Diagnosis not present

## 2020-11-09 NOTE — Patient Instructions (Signed)
[name] , Thank you for taking time to come for your Medicare Wellness Visit. I appreciate your ongoing commitment to your health goals. Please review the following plan we discussed and let me know if I can assist you in the future.   Screening recommendations/referrals: Colonoscopy: up to date Recommended yearly ophthalmology/optometry visit for glaucoma screening and checkup Recommended yearly dental visit for hygiene and checkup  Vaccinations: Influenza vaccine: up to date Pneumococcal vaccine: up to date Tdap vaccine: up to date Shingles vaccine: up to date    Advanced directives: Education provided  Conditions/risks identified:     Preventive Care 65 Years and Older, Male Preventive care refers to lifestyle choices and visits with your health care provider that can promote health and wellness. What does preventive care include? A yearly physical exam. This is also called an annual well check. Dental exams once or twice a year. Routine eye exams. Ask your health care provider how often you should have your eyes checked. Personal lifestyle choices, including: Daily care of your teeth and gums. Regular physical activity. Eating a healthy diet. Avoiding tobacco and drug use. Limiting alcohol use. Practicing safe sex. Taking low doses of aspirin every day. Taking vitamin and mineral supplements as recommended by your health care provider. What happens during an annual well check? The services and screenings done by your health care provider during your annual well check will depend on your age, overall health, lifestyle risk factors, and family history of disease. Counseling  Your health care provider may ask you questions about your: Alcohol use. Tobacco use. Drug use. Emotional well-being. Home and relationship well-being. Sexual activity. Eating habits. History of falls. Memory and ability to understand (cognition). Work and work Astronomer. Screening  You may  have the following tests or measurements: Height, weight, and BMI. Blood pressure. Lipid and cholesterol levels. These may be checked every 5 years, or more frequently if you are over 7 years old. Skin check. Lung cancer screening. You may have this screening every year starting at age 83 if you have a 30-pack-year history of smoking and currently smoke or have quit within the past 15 years. Fecal occult blood test (FOBT) of the stool. You may have this test every year starting at age 59. Flexible sigmoidoscopy or colonoscopy. You may have a sigmoidoscopy every 5 years or a colonoscopy every 10 years starting at age 50. Prostate cancer screening. Recommendations will vary depending on your family history and other risks. Hepatitis C blood test. Hepatitis B blood test. Sexually transmitted disease (STD) testing. Diabetes screening. This is done by checking your blood sugar (glucose) after you have not eaten for a while (fasting). You may have this done every 1-3 years. Abdominal aortic aneurysm (AAA) screening. You may need this if you are a current or former smoker. Osteoporosis. You may be screened starting at age 74 if you are at high risk. Talk with your health care provider about your test results, treatment options, and if necessary, the need for more tests. Vaccines  Your health care provider may recommend certain vaccines, such as: Influenza vaccine. This is recommended every year. Tetanus, diphtheria, and acellular pertussis (Tdap, Td) vaccine. You may need a Td booster every 10 years. Zoster vaccine. You may need this after age 44. Pneumococcal 13-valent conjugate (PCV13) vaccine. One dose is recommended after age 14. Pneumococcal polysaccharide (PPSV23) vaccine. One dose is recommended after age 32. Talk to your health care provider about which screenings and vaccines you need and how  often you need them. This information is not intended to replace advice given to you by your  health care provider. Make sure you discuss any questions you have with your health care provider. Document Released: 01/23/2015 Document Revised: 09/16/2015 Document Reviewed: 10/28/2014 Elsevier Interactive Patient Education  2017 [place] Prevention in the Home Falls can cause injuries. They can happen to people of all ages. There are many things you can do to make your home safe and to help prevent falls. What can I do on the outside of my home? Regularly fix the edges of walkways and driveways and fix any cracks. Remove anything that might make you trip as you walk through a door, such as a raised step or threshold. Trim any bushes or trees on the path to your home. Use bright outdoor lighting. Clear any walking paths of anything that might make someone trip, such as rocks or tools. Regularly check to see if handrails are loose or broken. Make sure that both sides of any steps have handrails. Any raised decks and porches should have guardrails on the edges. Have any leaves, snow, or ice cleared regularly. Use sand or salt on walking paths during winter. Clean up any spills in your garage right away. This includes oil or grease spills. What can I do in the bathroom? Use night lights. Install grab bars by the toilet and in the tub and shower. Do not use towel bars as grab bars. Use non-skid mats or decals in the tub or shower. If you need to sit down in the shower, use a plastic, non-slip stool. Keep the floor dry. Clean up any water that spills on the floor as soon as it happens. Remove soap buildup in the tub or shower regularly. Attach bath mats securely with double-sided non-slip rug tape. Do not have throw rugs and other things on the floor that can make you trip. What can I do in the bedroom? Use night lights. Make sure that you have a light by your bed that is easy to reach. Do not use any sheets or blankets that are too big for your bed. They should not hang down  onto the floor. Have a firm chair that has side arms. You can use this for support while you get dressed. Do not have throw rugs and other things on the floor that can make you trip. What can I do in the kitchen? Clean up any spills right away. Avoid walking on wet floors. Keep items that you use a lot in easy-to-reach places. If you need to reach something above you, use a strong step stool that has a grab bar. Keep electrical cords out of the way. Do not use floor polish or wax that makes floors slippery. If you must use wax, use non-skid floor wax. Do not have throw rugs and other things on the floor that can make you trip. What can I do with my stairs? Do not leave any items on the stairs. Make sure that there are handrails on both sides of the stairs and use them. Fix handrails that are broken or loose. Make sure that handrails are as long as the stairways. Check any carpeting to make sure that it is firmly attached to the stairs. Fix any carpet that is loose or worn. Avoid having throw rugs at the top or bottom of the stairs. If you do have throw rugs, attach them to the floor with carpet tape. Make sure that you have a  light switch at the top of the stairs and the bottom of the stairs. If you do not have them, ask someone to add them for you. What else can I do to help prevent falls? Wear shoes that: Do not have high heels. Have rubber bottoms. Are comfortable and fit you well. Are closed at the toe. Do not wear sandals. If you use a stepladder: Make sure that it is fully opened. Do not climb a closed stepladder. Make sure that both sides of the stepladder are locked into place. Ask someone to hold it for you, if possible. Clearly mark and make sure that you can see: Any grab bars or handrails. First and last steps. Where the edge of each step is. Use tools that help you move around (mobility aids) if they are needed. These include: Canes. Walkers. Scooters. Crutches. Turn  on the lights when you go into a dark area. Replace any light bulbs as soon as they burn out. Set up your furniture so you have a clear path. Avoid moving your furniture around. If any of your floors are uneven, fix them. If there are any pets around you, be aware of where they are. Review your medicines with your doctor. Some medicines can make you feel dizzy. This can increase your chance of falling. Ask your doctor what other things that you can do to help prevent falls. This information is not intended to replace advice given to you by your health care provider. Make sure you discuss any questions you have with your health care provider. Document Released: 10/23/2008 Document Revised: 06/04/2015 Document Reviewed: 01/31/2014 Elsevier Interactive Patient Education  2017 [employer].

## 2020-11-09 NOTE — Progress Notes (Signed)
Subjective:   Keith Cobb is a 70 y.o. male who presents for Medicare Annual/Subsequent preventive examination.  I connected with  [name] on 11/09/20 by a  telephone enabled telemedicine application and verified that I am speaking with the correct person using two identifiers.   I discussed the limitations of evaluation and management by telemedicine. The patient expressed understanding and agreed to proceed.    Review of Systems     Cardiac Risk Factors include: male gender;sedentary lifestyle;obesity (BMI >30kg/m2)     Objective:    Today's Vitals   There is no height or weight on file to calculate BMI.  Advanced Directives 11/09/2020 07/16/2018 05/09/2016  Does Patient Have a Medical Advance Directive? No No No  Would patient like information on creating a medical advance directive? No - Patient declined No - Patient declined -    Current Medications (verified) Outpatient Encounter Medications as of 11/09/2020  Medication Sig   DULoxetine (CYMBALTA) 60 MG capsule TAKE 1 CAPSULE BY MOUTH  TWICE DAILY   lisinopril (ZESTRIL) 10 MG tablet TAKE 1 TABLET BY MOUTH  DAILY FOR BLOOD PRESSURE   No facility-administered encounter medications on file as of 11/09/2020.    Allergies (verified) Bee venom   History: Past Medical History:  Diagnosis Date   Anxiety    Arthritis    Depression    Hypertension    No past surgical history on file. Family History  Problem Relation Age of Onset   Stroke Father    Social History   Socioeconomic History   Marital status: Married    Spouse name: Not on file   Number of children: Not on file   Years of education: Not on file   Highest education level: Not on file  Occupational History   Not on file  Tobacco Use   Smoking status: Some Days    Packs/day: 0.25    Years: 39.00    Pack years: 9.75    Types: Cigarettes   Smokeless tobacco: Never  Substance and Sexual Activity   Alcohol use: Yes    Alcohol/week: 12.0  standard drinks    Types: 12 Cans of beer per week   Drug use: No   Sexual activity: Not on file  Other Topics Concern   Not on file  Social History Narrative   Not on file   Social Determinants of Health   Financial Resource Strain: Low Risk    Difficulty of Paying Living Expenses: Not hard at all  Food Insecurity: No Food Insecurity   Worried About Programme researcher, broadcasting/film/video in the Last Year: Never true   Ran Out of Food in the Last Year: Never true  Transportation Needs: No Transportation Needs   Lack of Transportation (Medical): No   Lack of Transportation (Non-Medical): No  Physical Activity: Inactive   Days of Exercise per Week: 0 days   Minutes of Exercise per Session: 0 min  Stress: No Stress Concern Present   Feeling of Stress : Not at all  Social Connections: Moderately Isolated   Frequency of Communication with Friends and Family: Twice a week   Frequency of Social Gatherings with Friends and Family: Twice a week   Attends Religious Services: Never   Database administrator or Organizations: No   Attends Engineer, structural: Never   Marital Status: Married    Tobacco Counseling Ready to quit: Not Answered Counseling given: Not Answered   Clinical Intake:  Pre-visit preparation completed: No  Pain : No/denies pain     Nutritional Risks: None Diabetes: No  How often do you need to have someone help you when you read instructions, pamphlets, or other written materials from your doctor or pharmacy?: 1 - Never  Diabetic?   no  Interpreter Needed?: No  Information entered by :: Remi Haggard LPN   Activities of Daily Living In your present state of health, do you have any difficulty performing the following activities: 11/09/2020  Hearing? Y  Vision? N  Difficulty concentrating or making decisions? N  Walking or climbing stairs? N  Dressing or bathing? N  Doing errands, shopping? N  Preparing Food and eating ? N  Using the Toilet? N  In the  past six months, have you accidently leaked urine? N  Do you have problems with loss of bowel control? N  Managing your Medications? N  Managing your Finances? N  Housekeeping or managing your Housekeeping? N  Some recent data might be hidden    Patient Care Team: [name], MD as PCP - General (Family Medicine)  Indicate any recent Medical Services you may have received from other than [facility] providers in the past year (date may be approximate).     Assessment:   This is a routine wellness examination for Keith Cobb.  Hearing/Vision screen Hearing Screening - Comments:: Has hearing aids  Does not wear at this time  Vision Screening - Comments:: Up to date  Dr. Dione Booze office  Dietary issues and exercise activities discussed: Current Exercise Habits: The patient does not participate in regular exercise at present, Exercise limited by: orthopedic condition(s)   Goals Addressed             This Visit's Progress    Patient Stated       Maintain current lifestyle       Depression Screen PHQ 2/9 Scores 11/09/2020 05/15/2020 10/29/2019 10/03/2019 04/24/2019 04/24/2019 04/03/2019  PHQ - 2 Score 0 0 0 1 0 0 1  PHQ- 9 Score - - - - - - -  Exception Documentation - - - - - - -  Not completed - - - - - - -    Fall Risk Fall Risk  11/09/2020 05/15/2020 10/29/2019 10/03/2019 04/24/2019  Falls in the past year? 1 0 0 0 0  Number falls in past yr: 0 - 0 - -  Injury with Fall? 0 - 0 - -  Comment - - - - -  Follow up Falls evaluation completed;Falls prevention discussed Falls evaluation completed Falls evaluation completed Falls evaluation completed Falls evaluation completed    FALL RISK PREVENTION PERTAINING TO THE HOME:  Any stairs in or around the home? No  If so, are there any without handrails? No  Home free of loose throw rugs in walkways, pet beds, electrical cords, etc? Yes  Adequate lighting in your home to reduce risk of falls? Yes   ASSISTIVE DEVICES UTILIZED TO  PREVENT FALLS:  Life alert? No  Use of a cane, walker or w/c? No  Grab bars in the bathroom? No  Shower chair or bench in shower? No  Elevated toilet seat or a handicapped toilet? No   TIMED UP AND GO:  Was the test performed? No .    Cognitive Function:  Normal cognitive status assessed by direct observation by this Nurse Health Advisor. No abnormalities found.       6CIT Screen 07/16/2018  What Year? 0 points  What month? 0 points  What time?  0 points  Count back from 20 0 points  Months in reverse 0 points  Repeat phrase 0 points  Total Score 0    Immunizations Immunization History  Administered Date(s) Administered   Fluad Quad(high Dose 65+) 10/02/2018, 10/03/2019   Influenza, High Dose Seasonal PF 11/03/2016, 10/25/2017   Influenza,inj,Quad PF,6+ Mos 10/30/2012, 12/31/2014, 11/06/2015   Influenza-Unspecified 10/16/2019   PFIZER(Purple Top)SARS-COV-2 Vaccination 07/08/2019, 07/29/2019   Pneumococcal Conjugate-13 05/09/2016   Pneumococcal Polysaccharide-23 07/16/2018   Tdap 05/04/2015   Zoster Recombinat (Shingrix) 10/21/2019, 03/16/2020    TDAP status: Up to date  Flu Vaccine status: Up to date  Pneumococcal vaccine status: Up to date  Covid-19 vaccine status: Information provided on how to obtain vaccines.   Qualifies for Shingles Vaccine? No   Zostavax completed Yes   Shingrix Completed?: Yes  Screening Tests Health Maintenance  Topic Date Due   COVID-19 Vaccine (3 - Booster for Pfizer series) 09/23/2019   TETANUS/TDAP  05/03/2025   COLONOSCOPY (Pts 45-64yrs Insurance coverage will need to be confirmed)  07/31/2028   Pneumonia Vaccine 67+ Years old  Completed   INFLUENZA VACCINE  Completed   Hepatitis C Screening  Completed   Zoster Vaccines- Shingrix  Completed   HPV VACCINES  Aged Out    Health Maintenance  Health Maintenance Due  Topic Date Due   COVID-19 Vaccine (3 - Booster for Pfizer series) 09/23/2019    Colorectal cancer  screening: Type of screening: Cologuard. Completed 2020. Repeat every 3 years  Lung Cancer Screening: (Low Dose CT Chest recommended if Age 70-80 years, 30 pack-year currently smoking OR have quit w/in 15years.) does not qualify.   Lung Cancer Screening Referral:   Additional Screening:  Hepatitis C Screening: does qualify;   Vision Screening: Recommended annual ophthalmology exams for early detection of glaucoma and other disorders of the eye. Is the patient up to date with their annual eye exam?  Yes  Who is the provider or what is the name of the office in which the patient attends annual eye exams? Dr. Dione Booze If pt is not established with a provider, would they like to be referred to a provider to establish care? No .   Dental Screening: Recommended annual dental exams for proper oral hygiene  Community Resource Referral / Chronic Care Management: CRR required this visit?  No   CCM required this visit?  No      Plan:     I have personally reviewed and noted the following in the patient's chart:   Medical and social history Use of alcohol, tobacco or illicit drugs  Current medications and supplements including opioid prescriptions. Patient is not currently taking opioid prescriptions. Functional ability and status Nutritional status Physical activity Advanced directives List of other physicians Hospitalizations, surgeries, and ER visits in previous 12 months Vitals Screenings to include cognitive, depression, and falls Referrals and appointments  In addition, I have reviewed and discussed with patient certain preventive protocols, quality metrics, and best practice recommendations. A written personalized care plan for preventive services as well as general preventive health recommendations were provided to patient.     Remi Haggard, LPN   [id number]   Nurse Notes:

## 2020-11-12 ENCOUNTER — Ambulatory Visit: Payer: Medicare Other

## 2020-11-19 ENCOUNTER — Other Ambulatory Visit: Payer: Self-pay | Admitting: Family Medicine

## 2020-11-19 DIAGNOSIS — I10 Essential (primary) hypertension: Secondary | ICD-10-CM

## 2020-11-28 ENCOUNTER — Other Ambulatory Visit: Payer: Self-pay | Admitting: Family Medicine

## 2020-11-28 DIAGNOSIS — I10 Essential (primary) hypertension: Secondary | ICD-10-CM

## 2020-12-26 ENCOUNTER — Encounter: Payer: Self-pay | Admitting: Family Medicine

## 2021-01-07 ENCOUNTER — Other Ambulatory Visit: Payer: Self-pay | Admitting: Family Medicine

## 2021-01-07 DIAGNOSIS — I10 Essential (primary) hypertension: Secondary | ICD-10-CM

## 2021-06-05 ENCOUNTER — Encounter: Payer: Self-pay | Admitting: Family Medicine

## 2021-06-05 DIAGNOSIS — I10 Essential (primary) hypertension: Secondary | ICD-10-CM

## 2021-06-09 MED ORDER — LISINOPRIL 10 MG PO TABS: ORAL_TABLET | ORAL | 2 refills | Status: DC

## 2021-07-22 DIAGNOSIS — H0102A Squamous blepharitis right eye, upper and lower eyelids: Secondary | ICD-10-CM | POA: Diagnosis not present

## 2021-07-22 DIAGNOSIS — H2513 Age-related nuclear cataract, bilateral: Secondary | ICD-10-CM | POA: Diagnosis not present

## 2021-07-22 DIAGNOSIS — H179 Unspecified corneal scar and opacity: Secondary | ICD-10-CM | POA: Diagnosis not present

## 2021-07-22 DIAGNOSIS — H40053 Ocular hypertension, bilateral: Secondary | ICD-10-CM | POA: Diagnosis not present

## 2021-07-22 DIAGNOSIS — H524 Presbyopia: Secondary | ICD-10-CM | POA: Diagnosis not present

## 2021-07-22 DIAGNOSIS — B0239 Other herpes zoster eye disease: Secondary | ICD-10-CM | POA: Diagnosis not present

## 2021-07-22 DIAGNOSIS — H5213 Myopia, bilateral: Secondary | ICD-10-CM | POA: Diagnosis not present

## 2021-07-22 DIAGNOSIS — H04123 Dry eye syndrome of bilateral lacrimal glands: Secondary | ICD-10-CM | POA: Diagnosis not present

## 2021-07-22 DIAGNOSIS — H52223 Regular astigmatism, bilateral: Secondary | ICD-10-CM | POA: Diagnosis not present

## 2021-07-22 DIAGNOSIS — H0102B Squamous blepharitis left eye, upper and lower eyelids: Secondary | ICD-10-CM | POA: Diagnosis not present

## 2021-08-18 ENCOUNTER — Encounter: Payer: Medicare Other | Admitting: Family Medicine

## 2021-08-18 ENCOUNTER — Telehealth: Payer: Self-pay | Admitting: Family Medicine

## 2021-08-18 DIAGNOSIS — Z91030 Bee allergy status: Secondary | ICD-10-CM

## 2021-08-18 MED ORDER — EPINEPHRINE 0.3 MG/0.3ML IJ SOAJ: 0.3000 mg | INTRAMUSCULAR | 0 refills | Status: AC | PRN

## 2021-08-18 NOTE — Telephone Encounter (Signed)
This is not part of his current med list, please advise

## 2021-08-18 NOTE — Telephone Encounter (Signed)
Pt called in asking if Dr. Neva Seat can call in a script for an epipen? 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 Neva Seat has done this in the past. Pt uses [facility] on [address]   Please advise

## 2021-08-18 NOTE — Telephone Encounter (Signed)
History of bee sting allergy and EpiPen was called in by my colleague at the time in 2018.  EpiPen reordered.

## 2021-08-25 ENCOUNTER — Other Ambulatory Visit: Payer: Self-pay | Admitting: Family Medicine

## 2021-08-25 DIAGNOSIS — F3162 Bipolar disorder, current episode mixed, moderate: Secondary | ICD-10-CM

## 2021-09-09 ENCOUNTER — Encounter: Payer: Self-pay | Admitting: Family Medicine

## 2021-09-09 ENCOUNTER — Ambulatory Visit (INDEPENDENT_AMBULATORY_CARE_PROVIDER_SITE_OTHER): Payer: Medicare Other | Admitting: Family Medicine

## 2021-09-09 VITALS — BP 138/78 | HR 83 | Temp 97.9°F | Ht 71.0 in | Wt 206.8 lb

## 2021-09-09 DIAGNOSIS — Z720 Tobacco use: Secondary | ICD-10-CM

## 2021-09-09 DIAGNOSIS — Z1211 Encounter for screening for malignant neoplasm of colon: Secondary | ICD-10-CM

## 2021-09-09 DIAGNOSIS — F339 Major depressive disorder, recurrent, unspecified: Secondary | ICD-10-CM

## 2021-09-09 DIAGNOSIS — Z789 Other specified health status: Secondary | ICD-10-CM | POA: Diagnosis not present

## 2021-09-09 DIAGNOSIS — I10 Essential (primary) hypertension: Secondary | ICD-10-CM | POA: Diagnosis not present

## 2021-09-09 DIAGNOSIS — Z8659 Personal history of other mental and behavioral disorders: Secondary | ICD-10-CM

## 2021-09-09 DIAGNOSIS — F1090 Alcohol use, unspecified, uncomplicated: Secondary | ICD-10-CM

## 2021-09-09 DIAGNOSIS — Z0000 Encounter for general adult medical examination without abnormal findings: Secondary | ICD-10-CM

## 2021-09-09 LAB — COMPREHENSIVE METABOLIC PANEL
ALT: 25 U/L (ref 0–53)
AST: 27 U/L (ref 0–37)
Albumin: 4.3 g/dL (ref 3.5–5.2)
Alkaline Phosphatase: 60 U/L (ref 39–117)
BUN: 11 mg/dL (ref 6–23)
CO2: 29 mEq/L (ref 19–32)
Calcium: 9.5 mg/dL (ref 8.4–10.5)
Chloride: 100 mEq/L (ref 96–112)
Creatinine, Ser: 0.84 mg/dL (ref 0.40–1.50)
GFR: 88.16 mL/min (ref >60.00)
Glucose, Bld: 97 mg/dL (ref 70–99)
Potassium: 4.4 mEq/L (ref 3.5–5.1)
Sodium: 137 mEq/L (ref 135–145)
Total Bilirubin: 1.4 mg/dL — ABNORMAL HIGH (ref 0.2–1.2)
Total Protein: 7.3 g/dL (ref 6.0–8.3)

## 2021-09-09 LAB — TSH: TSH: 1.33 u[IU]/mL (ref 0.35–5.50)

## 2021-09-09 MED ORDER — LISINOPRIL 10 MG PO TABS: ORAL_TABLET | ORAL | 2 refills | Status: DC

## 2021-09-09 NOTE — Progress Notes (Signed)
Subjective:  Patient ID: Keith Cobb, male    DOB: October 05, 1950  Age: 71 y.o. MRN: [id number]  CC:  Chief Complaint  Patient presents with   Annual Exam    Pt states all is well, pt needs a cologaurd and needs a handicap plaqued  Pt is fasting    Depression    PHQ9 - 12 score    HPI Keith Cobb presents for Annual Exam, but overdue for follow up of other chronic conditions.   Care team Primary care provider me, [facility], Dr. Turner [facility].  Not followed by [facility].   Hypertension: Lisinopril 10 mg daily. No new side effects.  Some bruising on arms at times.  Home readings: up to 145/80-89. BP Readings from Last 3 Encounters:  09/09/21 138/78  06/05/20 129/87  05/15/20 114/78   Lab Results  Component Value Date   CREATININE 0.83 10/03/2019   History of bee sting allergy, EpiPen recently refilled if needed.  Nicotine addiction/tobacco abuse Cessation discussed previously.  Suspicious for peripheral artery disease at his May 2022 visit, Some purple discoloration noted in his left toe at times.referred to vascular specialist.  Home nursing screen positive for PAD at that time.  Vascular vein specialist eval Jun 05, 2020.  Diagnosed with PAD with calcified lower extremity arteries.  ABI normal with triphasic blood flow, not at risk of toe or limb loss, activity as tolerated. 1 pack per week - has been cutting back on cigarettes.   Abdominal hernia Noted at evaluation with vascular May 2022.  PCP follow-up recommended. No pain. Upper abdomen hernia. No blood in stool or constipation. Denies symptoms at this time, declines CT eval or further workup.   Depression with possible bipolar disorder Mood swings discussed in the past, plan for psychiatry follow-up at [facility], treated with Cymbalta 60 mg daily.  Was not able to be seen by [facility]. History of alcohol overuse, last discussed medication in 2021.  At that time was drinking 24  beers every 10 days and was cutting back on alcohol use and smoking.  Denied manic symptoms at that time. Denies current manic symptoms. - sleeping ok, no spending sprees.   Drinking up to 5 beers per day, typically a case of beer per week, sometimes more, sometimes less.  Has cut back.  Prior was in AA several years back. Declines repeat treatment with AA.   2 sons committed suicide. Denies suicidal thoughts, or homicidal thoughts.  Feeling more depressed past year or two. Noted with deaths in family.  No counselor.  Denies a/v/t hallucinations.   Flowsheet Row Office Visit from 09/09/2021 in [facility]  AUDIT-C Score 8          09/09/2021    1:11 PM 11/09/2020   12:35 PM 05/15/2020   11:27 AM 10/29/2019   11:50 AM 10/03/2019    1:51 PM  Depression screen PHQ 2/9  Decreased Interest 2 0 0 0 0  Down, Depressed, Hopeless 2 0 0 0 1  PHQ - 2 Score 4 0 0 0 1  Altered sleeping 2      Tired, decreased energy 1      Change in appetite 2      Feeling bad or failure about yourself  0      Trouble concentrating 0      Moving slowly or fidgety/restless 0      Suicidal thoughts 1      PHQ-9 Score  10       Needs new cologuard ordered.   Needs handicap placard d/t difficulty with distances due to left knee pain/arthritis.    History Patient Active Problem List   Diagnosis Date Noted   Osteoarthritis of left knee 06/04/2020   Tobacco use 06/04/2020   Sensorineural hearing loss, bilateral 06/04/2020   Other specified counseling 06/04/2020   Smoker 05/04/2015   HTN (hypertension) 03/19/2012   Depression 03/19/2012   Past Medical History:  Diagnosis Date   Anxiety    Arthritis    Depression    Hypertension    No past surgical history on file. Allergies  Allergen Reactions   Bee Venom    Prior to Admission medications   Medication Sig Start Date End Date Taking? Authorizing Provider  DULoxetine (CYMBALTA) 60 MG capsule TAKE 1 CAPSULE  BY MOUTH  TWICE DAILY 08/25/21  Yes Wendie Agreste, MD  EPINEPHrine (EPIPEN 2-PAK) 0.3 mg/0.3 mL IJ SOAJ injection Inject 0.3 mg into the muscle as needed for anaphylaxis. 08/18/21  Yes Wendie Agreste, MD  lisinopril (ZESTRIL) 10 MG tablet TAKE 1 TABLET BY MOUTH EVERY DAY FOR BLOOD PRESSURE 06/09/21  Yes Wendie Agreste, MD   Social History   Socioeconomic History   Marital status: Married    Spouse name: Not on file   Number of children: Not on file   Years of education: Not on file   Highest education level: Not on file  Occupational History   Not on file  Tobacco Use   Smoking status: Some Days    Packs/day: 0.25    Years: 39.00    Total pack years: 9.75    Types: Cigarettes   Smokeless tobacco: Never  Substance and Sexual Activity   Alcohol use: Yes    Alcohol/week: 12.0 standard drinks of alcohol    Types: 12 Cans of beer per week   Drug use: No   Sexual activity: Not on file  Other Topics Concern   Not on file  Social History Narrative   Not on file   Social Determinants of Health   Financial Resource Strain: Low Risk  (11/09/2020)   Overall Financial Resource Strain (CARDIA)    Difficulty of Paying Living Expenses: Not hard at all  Food Insecurity: No Food Insecurity (11/09/2020)   Hunger Vital Sign    Worried About Running Out of Food in the Last Year: Never true    Ran Out of Food in the Last Year: Never true  Transportation Needs: No Transportation Needs (11/09/2020)   PRAPARE - Hydrologist (Medical): No    Lack of Transportation (Non-Medical): No  Physical Activity: Inactive (11/09/2020)   Exercise Vital Sign    Days of Exercise per Week: 0 days    Minutes of Exercise per Session: 0 min  Stress: No Stress Concern Present (11/09/2020)   [place]    Feeling of Stress : Not at all  Social Connections: Moderately Isolated (11/09/2020)   Social Connection and  Isolation Panel [NHANES]    Frequency of Communication with Friends and Family: Twice a week    Frequency of Social Gatherings with Friends and Family: Twice a week    Attends Religious Services: Never    Marine scientist or Organizations: No    Attends Archivist Meetings: Never    Marital Status: Married  Human resources officer Violence: Not At Risk (11/09/2020)   Humiliation, Afraid,  Rape, and Kick questionnaire    Fear of Current or Ex-Partner: No    Emotionally Abused: No    Physically Abused: No    Sexually Abused: No    Review of Systems  Per HPI.  Objective:   Vitals:   09/09/21 1329  BP: 138/78  Pulse: 83  Temp: 97.9 F (36.6 C)  SpO2: 95%  Weight: 206 lb 12.8 oz (93.8 kg)  Height: 5\' 11"  (1.803 m)     Physical Exam Vitals reviewed.  Constitutional:      Appearance: He is well-developed.  HENT:     Head: Normocephalic and atraumatic.  Neck:     Vascular: No carotid bruit or JVD.  Cardiovascular:     Rate and Rhythm: Normal rate and regular rhythm.     Heart sounds: Normal heart sounds. No murmur heard. Pulmonary:     Effort: Pulmonary effort is normal.     Breath sounds: Normal breath sounds. No rales.  Abdominal:     General: Abdomen is flat. Bowel sounds are normal. There is no distension.     Tenderness: There is no abdominal tenderness.     Comments: No palpable defect appreciated.   Musculoskeletal:     Right lower leg: No edema.     Left lower leg: No edema.  Skin:    General: Skin is warm and dry.  Neurological:     Mental Status: He is alert and oriented to person, place, and time.  Psychiatric:        Mood and Affect: Mood normal.    46 minutes spent during visit, including chart review, counseling and assimilation of information, exam, discussion of depression, mental health symptoms, alcohol use and tobacco use as above, plan, and chart completion.     Assessment & Plan:  Keith Cobb is a 71 y.o. male . Essential  hypertension - Plan: lisinopril (ZESTRIL) 10 MG tablet, TSH, Comprehensive metabolic panel    Stable, tolerating current regimen. Medications refilled. Labs pending as above.   Alcohol use Depression, recurrent (HCC) - Plan: Ambulatory referral to Psychiatry History of bipolar disorder - Plan: Ambulatory referral to Psychiatry  -Variable control depression symptoms, still suspect component of alcohol abuse/substance-induced mood disorder may be contributing with history of bipolar disorder.  Psychiatry recommended previously, thought to have follow-up with psych at [facility] but not option at this time.  Will refer to psychiatry, no med changes for now.  ER precautions given.  Continue to work on alcohol decreased use, resources provided if needed.  Declines repeat involvement with AA at this time.  Tobacco abuse Commended on decreased use.  Continue to work on cessation.  Screening for colon cancer - Plan: Cologuard   No orders of the defined types were placed in this encounter.  Patient Instructions  I will refer you to psychiatry to make sure you are on appropriate meds. No change in Cymbalta dose for now.   Continue to cut back on alcohol as that can worsen depression and mood symptoms.   If you need help cutting back on alcohol here are some resources.   [facility]  Address: [address], [place], [place]  Phone: [phone]  [facility]  Address: [address] [place], [place]  Phone: [phone]  [facility] Address: [address] [place], [place], [place]  Phone: [phone]     Signed,   [phone], MD [facility], [facility]  09/09/21 1:36 PM

## 2021-09-09 NOTE — Patient Instructions (Addendum)
I will refer you to psychiatry to make sure you are on appropriate meds. No change in Cymbalta dose for now.   Continue to cut back on alcohol as that can worsen depression and mood symptoms.   If you need help cutting back on alcohol here are some resources.   [facility]  Address: [address], [place], [place] [address]  Phone: [phone]  [facility] ([facility]  Address: [address] [place], [place] [address]  Phone: [phone]  [facility] Address: [address] [place], [place], [place] [address]  Phone: [phone]

## 2021-09-15 DIAGNOSIS — Z1211 Encounter for screening for malignant neoplasm of colon: Secondary | ICD-10-CM | POA: Diagnosis not present

## 2021-09-19 LAB — COLOGUARD: COLOGUARD: NEGATIVE

## 2021-10-05 ENCOUNTER — Other Ambulatory Visit: Payer: Self-pay | Admitting: Family Medicine

## 2021-10-05 DIAGNOSIS — F3162 Bipolar disorder, current episode mixed, moderate: Secondary | ICD-10-CM

## 2021-10-20 ENCOUNTER — Encounter: Payer: Medicare Other | Admitting: Family Medicine

## 2021-12-15 ENCOUNTER — Ambulatory Visit (INDEPENDENT_AMBULATORY_CARE_PROVIDER_SITE_OTHER): Payer: Medicare Other | Admitting: Internal Medicine

## 2021-12-15 ENCOUNTER — Encounter: Payer: Self-pay | Admitting: Internal Medicine

## 2021-12-15 VITALS — BP 128/76 | HR 64 | Temp 98.2°F | Ht 71.0 in | Wt 203.0 lb

## 2021-12-15 DIAGNOSIS — R0989 Other specified symptoms and signs involving the circulatory and respiratory systems: Secondary | ICD-10-CM | POA: Diagnosis not present

## 2021-12-15 DIAGNOSIS — I10 Essential (primary) hypertension: Secondary | ICD-10-CM | POA: Diagnosis not present

## 2021-12-15 DIAGNOSIS — Z0001 Encounter for general adult medical examination with abnormal findings: Secondary | ICD-10-CM

## 2021-12-15 DIAGNOSIS — E785 Hyperlipidemia, unspecified: Secondary | ICD-10-CM | POA: Diagnosis not present

## 2021-12-15 DIAGNOSIS — R4189 Other symptoms and signs involving cognitive functions and awareness: Secondary | ICD-10-CM | POA: Diagnosis not present

## 2021-12-15 DIAGNOSIS — R053 Chronic cough: Secondary | ICD-10-CM

## 2021-12-15 DIAGNOSIS — F331 Major depressive disorder, recurrent, moderate: Secondary | ICD-10-CM

## 2021-12-15 DIAGNOSIS — Z125 Encounter for screening for malignant neoplasm of prostate: Secondary | ICD-10-CM

## 2021-12-15 DIAGNOSIS — Z23 Encounter for immunization: Secondary | ICD-10-CM

## 2021-12-15 DIAGNOSIS — Z720 Tobacco use: Secondary | ICD-10-CM

## 2021-12-15 DIAGNOSIS — R9431 Abnormal electrocardiogram [ECG] [EKG]: Secondary | ICD-10-CM

## 2021-12-15 NOTE — Progress Notes (Addendum)
Subjective:  Patient ID: Keith Cobb, male    DOB: 02/03/1950  Age: 71 y.o. MRN: [id number]  CC: Cough, Annual Exam, Hypertension, and Hyperlipidemia   HPI Keith RoyalCarl III Cobb presents for a CPX and to establish.  He complains that he has a depression that is not adequately well-controlled with duloxetine.  He feels irritable and is having crying spells.  He is not having any trouble sleeping but has felt helpless, hopeless, and worthless.  He is active and denies chest pain, shortness of breath, diaphoresis, or edema.  Outpatient Medications Prior to Visit  Medication Sig Dispense Refill   DULoxetine (CYMBALTA) 60 MG capsule TAKE 1 CAPSULE BY MOUTH TWICE  DAILY 90 capsule 7   EPINEPHrine (EPIPEN 2-PAK) 0.3 mg/0.3 mL IJ SOAJ injection Inject 0.3 mg into the muscle as needed for anaphylaxis. 1 each 0   lisinopril (ZESTRIL) 10 MG tablet TAKE 1 TABLET BY MOUTH EVERY DAY FOR BLOOD PRESSURE 90 tablet 2   No facility-administered medications prior to visit.    ROS Review of Systems  Constitutional: Negative.  Negative for diaphoresis, fatigue and fever.  HENT: Negative.    Eyes: Negative.   Respiratory:  Positive for cough. Negative for choking, chest tightness, shortness of breath and stridor.   Cardiovascular:  Negative for chest pain, palpitations and leg swelling.  Gastrointestinal:  Negative for abdominal pain, constipation, diarrhea, nausea and vomiting.  Endocrine: Negative.   Genitourinary: Negative.  Negative for difficulty urinating.  Musculoskeletal:  Positive for arthralgias. Negative for myalgias.  Skin: Negative.   Neurological:  Negative for dizziness, weakness and light-headedness.  Hematological:  Negative for adenopathy. Does not bruise/bleed easily.  Psychiatric/Behavioral:  Positive for confusion, decreased concentration and dysphoric mood. Negative for behavioral problems, self-injury, sleep disturbance and suicidal ideas. The patient is not nervous/anxious.      Objective:  BP 128/76 (BP Location: Left Arm, Patient Position: Sitting, Cuff Size: Large)   Pulse 64   Temp 98.2 F (36.8 C) (Oral)   Ht 5\' 11"  (1.803 m)   Wt 203 lb (92.1 kg)   SpO2 98%   BMI 28.31 kg/m   BP Readings from Last 3 Encounters:  12/15/21 128/76  09/09/21 138/78  06/05/20 129/87    Wt Readings from Last 3 Encounters:  12/15/21 203 lb (92.1 kg)  09/09/21 206 lb 12.8 oz (93.8 kg)  06/05/20 209 lb 1.6 oz (94.8 kg)    Physical Exam Vitals reviewed.  Constitutional:      Appearance: He is not ill-appearing.  HENT:     Nose: Nose normal.     Mouth/Throat:     Mouth: Mucous membranes are moist.  Eyes:     General: No scleral icterus.    Conjunctiva/sclera: Conjunctivae normal.  Cardiovascular:     Rate and Rhythm: Normal rate and regular rhythm.     Heart sounds: No murmur heard.    Comments: EKG- NSR, 90 bpm ++artifact Incomplete RBBB TWI in V1 V2 Pulmonary:     Effort: Pulmonary effort is normal.     Breath sounds: No stridor. No wheezing, rhonchi or rales.  Abdominal:     General: Abdomen is protuberant. There is abdominal bruit. There is no distension.     Palpations: There is no mass.     Tenderness: There is no guarding or rebound.     Hernia: No hernia is present.  Musculoskeletal:        General: Normal range of motion.     Cervical back: Neck  supple.     Right lower leg: No edema.     Left lower leg: No edema.  Lymphadenopathy:     Cervical: No cervical adenopathy.  Skin:    General: Skin is warm and dry.  Neurological:     General: No focal deficit present.     Mental Status: He is alert and oriented to person, place, and time.  Psychiatric:        Attention and Perception: He is inattentive.        Mood and Affect: Mood is depressed. Mood is not anxious. Affect is tearful. Affect is not blunt or angry.        Speech: He is communicative. Speech is tangential. Speech is not rapid and pressured, delayed or slurred.         Behavior: Behavior normal.        Thought Content: Thought content normal. Thought content is not paranoid or delusional. Thought content does not include homicidal or suicidal ideation. Thought content does not include homicidal plan.        Cognition and Memory: Cognition is impaired. Memory is impaired.     Lab Results  Component Value Date   WBC 5.9 05/19/2017   HGB 16.4 05/19/2017   HCT 47.2 05/19/2017   PLT 211 05/19/2017   GLUCOSE 97 09/09/2021   CHOL 136 12/22/2021   TRIG 85.0 12/22/2021   HDL 61.30 12/22/2021   LDLCALC 58 12/22/2021   ALT 25 09/09/2021   AST 27 09/09/2021   NA 137 09/09/2021   K 4.4 09/09/2021   CL 100 09/09/2021   CREATININE 0.84 09/09/2021   BUN 11 09/09/2021   CO2 29 09/09/2021   TSH 1.33 09/09/2021   PSA 1.42 12/22/2021   HGBA1C 5.6 10/03/2019    VAS [place] ABI WITH/WO TBI  Result Date: 06/05/2020  LOWER EXTREMITY DOPPLER STUDY Patient Name:  Keith Cobb  Date of Exam:   06/05/2020 Medical Rec #: [id number]       Accession #:    [id number] Date of Birth: 08-15-1950       Patient Gender: M Patient Age:   69Y Exam Location:  [facility] Procedure:      VAS [place] ABI WITH/WO TBI Referring Phys: [id number] SAMANTHA J RHYNE --------------------------------------------------------------------------------  Indications: Evaluation prior to knee surgery. High Risk Factors: Hypertension, current smoker. Other Factors: Patient complains of bilateral thigh pain when standing which is                relieved with sitting.  Performing Technologist: Hardie Lora RVT  Examination Guidelines: A complete evaluation includes at minimum, Doppler waveform signals and systolic blood pressure reading at the level of bilateral brachial, anterior tibial, and posterior tibial arteries, when vessel segments are accessible. Bilateral testing is considered an integral part of a complete examination. Photoelectric Plethysmograph (PPG) waveforms and toe systolic pressure  readings are included as required and additional duplex testing as needed. Limited examinations for reoccurring indications may be performed as noted.  ABI Findings: +---------+------------------+-----+---------+--------+ Right    Rt Pressure (mmHg)IndexWaveform Comment  +---------+------------------+-----+---------+--------+ Brachial 134                                      +---------+------------------+-----+---------+--------+ ATA      155               1.12                   +---------+------------------+-----+---------+--------+  PTA      157               1.13 triphasic         +---------+------------------+-----+---------+--------+ DP                              triphasic         +---------+------------------+-----+---------+--------+ Great Toe78                0.56                   +---------+------------------+-----+---------+--------+ +---------+------------------+-----+---------+-------+ Left     Lt Pressure (mmHg)IndexWaveform Comment +---------+------------------+-----+---------+-------+ Brachial 139                                     +---------+------------------+-----+---------+-------+ ATA      163               1.17                  +---------+------------------+-----+---------+-------+ PTA      136               0.98 triphasic        +---------+------------------+-----+---------+-------+ DP                              triphasic        +---------+------------------+-----+---------+-------+ Great Toe114               0.82                  +---------+------------------+-----+---------+-------+ +-------+-----------+-----------+------------+------------+ ABI/TBIToday's ABIToday's TBIPrevious ABIPrevious TBI +-------+-----------+-----------+------------+------------+ Right  1.13       0.56                                +-------+-----------+-----------+------------+------------+ Left   1.17       0.82                                 +-------+-----------+-----------+------------+------------+   Summary: Right: Resting right ankle-brachial index is within normal range. No evidence of significant right lower extremity arterial disease. The right toe-brachial index is abnormal. RT great toe pressure = 78 mmHg. Left: Resting left ankle-brachial index is within normal range. No evidence of significant left lower extremity arterial disease. The left toe-brachial index is normal. LT Great toe pressure = 114 mmHg.  *See table(s) above for measurements and observations.  Electronically signed by Lemar Livings MD on 06/05/2020 at 2:09:26 PM.    Final    DG Chest 2 View  Result Date: 12/24/2021 CLINICAL DATA:  Dry cough for 3 months. EXAM: CHEST - 2 VIEW COMPARISON:  PA and lateral 07/31/2014 FINDINGS: The cardiomediastinal silhouette and vascular pattern are normal. The lungs are clear. Eventration and mild elevation of the right hemidiaphragm is again noted. No pleural effusion is seen. There is advanced thoracic spondylosis. Multilevel degenerative discs. 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 IMPRESSION: No evidence of acute cardiopulmonary disease. Chronic findings as above. Stable chest. Electronically Signed   By: Almira Bar M.D.   On: 12/24/2021 00:13  Assessment & Plan:   [name] was seen today for cough, annual exam, hypertension and hyperlipidemia.  Diagnoses and all orders for this visit:  Tobacco use -     Ambulatory Referral for Lung Cancer Scre  Chronic cough- Will discontinue the ACE inhibitor and screen for lung cancer. -     DG Chest 2 View; Future  Cognitive impairment- Will evaluate for vitamin deficiencies. -     Vitamin B1; Future -     Folate; Future -     Vitamin B12; Future  Dyslipidemia, goal LDL below 130- Based on an old set of lipids his ASCVD risk score is 39%.  I recommended that he start taking a statin. -      Lipid panel; Future -     rosuvastatin (CRESTOR) 10 MG tablet; Take 1 tablet (10 mg total) by mouth daily.  Primary hypertension- His blood pressure is overcontrolled and he has a chronic cough.  Will discontinue the ACE inhibitor. -     EKG 12-Lead  Need for prophylactic vaccination and inoculation against influenza -     Flu Vaccine QUAD High Dose(Fluad)  Screening for prostate cancer -     PSA; Future  Encounter for general adult medical examination with abnormal findings- Exam completed, labs reviewed, vaccines reviewed and updated, cancer screenings addressed, patient education was given.  Abnormal electrocardiogram (ECG) (EKG)  Abdominal bruit- Will screen for AAA. -     VAS [place] AAA DUPLEX; Future  Moderate episode of recurrent major depressive disorder (HCC)- Will add an atypical antipsychotic to the duloxetine. -     cariprazine (VRAYLAR) 1.5 MG capsule; Take 1 capsule (1.5 mg total) by mouth daily.  Abnormal electrocardiogram- Will evaluate for ischemia. -     MYOCARDIAL PERFUSION IMAGING; Future -     Cardiac Stress Test: Informed Consent Details: Physician/Practitioner Attestation; Transcribe to consent form and obtain patient signature; Future   I have discontinued [name] Hietpas "Carl"'s lisinopril. I am also having him start on rosuvastatin. Additionally, I am having him maintain his EPINEPHrine and DULoxetine.  Meds ordered this encounter  Medications   DISCONTD: cariprazine (VRAYLAR) 1.5 MG capsule    Sig: Take 1 capsule (1.5 mg total) by mouth daily.    Dispense:  90 capsule    Refill:  1   rosuvastatin (CRESTOR) 10 MG tablet    Sig: Take 1 tablet (10 mg total) by mouth daily.    Dispense:  90 tablet    Refill:  1     Follow-up: Return in about 3 months (around 03/16/2022).  Sanda Linger, MD

## 2021-12-15 NOTE — Patient Instructions (Signed)
Health Maintenance, Male Adopting a healthy lifestyle and getting preventive care are important in promoting health and wellness. Ask your health care provider about: The right schedule for you to have regular tests and exams. Things you can do on your own to prevent diseases and keep yourself healthy. What should I know about diet, weight, and exercise? Eat a healthy diet  Eat a diet that includes plenty of vegetables, fruits, low-fat dairy products, and lean protein. Do not eat a lot of foods that are high in solid fats, added sugars, or sodium. Maintain a healthy weight Body mass index (BMI) is a measurement that can be used to identify possible weight problems. It estimates body fat based on height and weight. Your health care provider can help determine your BMI and help you achieve or maintain a healthy weight. Get regular exercise Get regular exercise. This is one of the most important things you can do for your health. Most adults should: Exercise for at least 150 minutes each week. The exercise should increase your heart rate and make you sweat (moderate-intensity exercise). Do strengthening exercises at least twice a week. This is in addition to the moderate-intensity exercise. Spend less time sitting. Even light physical activity can be beneficial. Watch cholesterol and blood lipids Have your blood tested for lipids and cholesterol at 71 years of age, then have this test every 5 years. You may need to have your cholesterol levels checked more often if: Your lipid or cholesterol levels are high. You are older than 71 years of age. You are at high risk for heart disease. What should I know about cancer screening? Many types of cancers can be detected early and may often be prevented. Depending on your health history and family history, you may need to have cancer screening at various ages. This may include screening for: Colorectal cancer. Prostate cancer. Skin cancer. Lung  cancer. What should I know about heart disease, diabetes, and high blood pressure? Blood pressure and heart disease High blood pressure causes heart disease and increases the risk of stroke. This is more likely to develop in people who have high blood pressure readings or are overweight. Talk with your health care provider about your target blood pressure readings. Have your blood pressure checked: Every 3-5 years if you are 18-39 years of age. Every year if you are 40 years old or older. If you are between the ages of 65 and 75 and are a current or former smoker, ask your health care provider if you should have a one-time screening for abdominal aortic aneurysm (AAA). Diabetes Have regular diabetes screenings. This checks your fasting blood sugar level. Have the screening done: Once every three years after age 45 if you are at a normal weight and have a low risk for diabetes. More often and at a younger age if you are overweight or have a high risk for diabetes. What should I know about preventing infection? Hepatitis B If you have a higher risk for hepatitis B, you should be screened for this virus. Talk with your health care provider to find out if you are at risk for hepatitis B infection. Hepatitis C Blood testing is recommended for: Everyone born from 1945 through 1965. Anyone with known risk factors for hepatitis C. Sexually transmitted infections (STIs) You should be screened each year for STIs, including gonorrhea and chlamydia, if: You are sexually active and are younger than 71 years of age. You are older than 71 years of age and your   health care provider tells you that you are at risk for this type of infection. Your sexual activity has changed since you were last screened, and you are at increased risk for chlamydia or gonorrhea. Ask your health care provider if you are at risk. Ask your health care provider about whether you are at high risk for HIV. Your health care provider  may recommend a prescription medicine to help prevent HIV infection. If you choose to take medicine to prevent HIV, you should first get tested for HIV. You should then be tested every 3 months for as long as you are taking the medicine. Follow these instructions at home: Alcohol use Do not drink alcohol if your health care provider tells you not to drink. If you drink alcohol: Limit how much you have to 0-2 drinks a day. Know how much alcohol is in your drink. In the [place] one drink equals one 12 oz bottle of beer (355 mL), one 5 oz glass of wine (148 mL), or one 1 oz glass of hard liquor (44 mL). Lifestyle Do not use any products that contain nicotine or tobacco. These products include cigarettes, chewing tobacco, and vaping devices, such as e-cigarettes. If you need help quitting, ask your health care provider. Do not use street drugs. Do not share needles. Ask your health care provider for help if you need support or information about quitting drugs. General instructions Schedule regular health, dental, and eye exams. Stay current with your vaccines. Tell your health care provider if: You often feel depressed. You have ever been abused or do not feel safe at home. Summary Adopting a healthy lifestyle and getting preventive care are important in promoting health and wellness. Follow your health care provider's instructions about healthy diet, exercising, and getting tested or screened for diseases. Follow your health care provider's instructions on monitoring your cholesterol and blood pressure. This information is not intended to replace advice given to you by your health care provider. Make sure you discuss any questions you have with your health care provider. Document Revised: 05/18/2020 Document Reviewed: 05/18/2020 Elsevier Patient Education  2023 Elsevier Inc.  

## 2021-12-16 ENCOUNTER — Encounter: Payer: Self-pay | Admitting: Internal Medicine

## 2021-12-16 ENCOUNTER — Other Ambulatory Visit: Payer: Self-pay

## 2021-12-16 DIAGNOSIS — F17210 Nicotine dependence, cigarettes, uncomplicated: Secondary | ICD-10-CM

## 2021-12-16 DIAGNOSIS — Z87891 Personal history of nicotine dependence: Secondary | ICD-10-CM

## 2021-12-16 DIAGNOSIS — Z122 Encounter for screening for malignant neoplasm of respiratory organs: Secondary | ICD-10-CM

## 2021-12-19 ENCOUNTER — Other Ambulatory Visit: Payer: Self-pay | Admitting: Internal Medicine

## 2021-12-19 DIAGNOSIS — F331 Major depressive disorder, recurrent, moderate: Secondary | ICD-10-CM

## 2021-12-19 DIAGNOSIS — R9431 Abnormal electrocardiogram [ECG] [EKG]: Secondary | ICD-10-CM | POA: Insufficient documentation

## 2021-12-19 MED ORDER — ROSUVASTATIN CALCIUM 10 MG PO TABS: 10.0000 mg | ORAL_TABLET | Freq: Every day | ORAL | 1 refills | Status: DC

## 2021-12-19 MED ORDER — ARIPIPRAZOLE 2 MG PO TABS: 2.0000 mg | ORAL_TABLET | Freq: Every day | ORAL | 0 refills | Status: DC

## 2021-12-19 MED ORDER — CARIPRAZINE HCL 1.5 MG PO CAPS: 1.5000 mg | ORAL_CAPSULE | Freq: Every day | ORAL | 1 refills | Status: DC

## 2021-12-22 ENCOUNTER — Ambulatory Visit (INDEPENDENT_AMBULATORY_CARE_PROVIDER_SITE_OTHER): Payer: Medicare Other

## 2021-12-22 ENCOUNTER — Other Ambulatory Visit (INDEPENDENT_AMBULATORY_CARE_PROVIDER_SITE_OTHER): Payer: Medicare Other

## 2021-12-22 DIAGNOSIS — R059 Cough, unspecified: Secondary | ICD-10-CM | POA: Diagnosis not present

## 2021-12-22 DIAGNOSIS — R053 Chronic cough: Secondary | ICD-10-CM | POA: Diagnosis not present

## 2021-12-22 DIAGNOSIS — Z125 Encounter for screening for malignant neoplasm of prostate: Secondary | ICD-10-CM

## 2021-12-22 DIAGNOSIS — R4189 Other symptoms and signs involving cognitive functions and awareness: Secondary | ICD-10-CM | POA: Diagnosis not present

## 2021-12-22 DIAGNOSIS — E785 Hyperlipidemia, unspecified: Secondary | ICD-10-CM | POA: Diagnosis not present

## 2021-12-22 LAB — LIPID PANEL
Cholesterol: 136 mg/dL (ref 0–200)
HDL: 61.3 mg/dL (ref >39.00)
LDL Cholesterol: 58 mg/dL (ref 0–99)
NonHDL: 75.12
Total CHOL/HDL Ratio: 2
Triglycerides: 85 mg/dL (ref 0.0–149.0)
VLDL: 17 mg/dL (ref 0.0–40.0)

## 2021-12-22 LAB — FOLATE: Folate: 12.6 ng/mL (ref >5.9)

## 2021-12-22 LAB — VITAMIN B12: Vitamin B-12: 632 pg/mL (ref 211–911)

## 2021-12-22 LAB — PSA: PSA: 1.42 ng/mL (ref 0.10–4.00)

## 2021-12-29 LAB — VITAMIN B1: Vitamin B1 (Thiamine): 18 nmol/L (ref 8–30)

## 2021-12-30 ENCOUNTER — Other Ambulatory Visit (HOSPITAL_COMMUNITY): Payer: Medicare Other

## 2022-01-13 ENCOUNTER — Encounter (HOSPITAL_COMMUNITY): Payer: Medicare Other

## 2022-01-20 ENCOUNTER — Telehealth (HOSPITAL_COMMUNITY): Payer: Self-pay | Admitting: *Deleted

## 2022-01-20 NOTE — Telephone Encounter (Signed)
Patient given detailed instructions per Myocardial Perfusion Study Information Sheet for the test on 01/26/2022 at 10:30. Patient notified to arrive 15 minutes early and that it is imperative to arrive on time for appointment to keep from having the test rescheduled.  If you need to cancel or reschedule your appointment, please call the office within 24 hours of your appointment. . Patient verbalized understanding.Keith Cobb

## 2022-01-26 ENCOUNTER — Encounter (HOSPITAL_COMMUNITY): Payer: Medicare Other

## 2022-01-31 ENCOUNTER — Telehealth: Payer: Self-pay | Admitting: Primary Care

## 2022-01-31 ENCOUNTER — Encounter: Payer: Medicare Other | Admitting: Primary Care

## 2022-01-31 NOTE — Telephone Encounter (Signed)
Called for today's shared decision visit for the lung cancer screening program, patient was unavailable as he was at the grocery store. Spoke with patients wife and they are wanting to cancel today's visit and LDCT scan. They would prefer to reschedule when warmer outside.

## 2022-02-01 ENCOUNTER — Other Ambulatory Visit: Payer: Medicare Other

## 2022-02-02 NOTE — Telephone Encounter (Signed)
Lung screening appointment was cancelled. Will call back at a later to reschedule.

## 2022-02-19 ENCOUNTER — Other Ambulatory Visit: Payer: Self-pay | Admitting: Internal Medicine

## 2022-02-19 DIAGNOSIS — F331 Major depressive disorder, recurrent, moderate: Secondary | ICD-10-CM

## 2022-03-23 ENCOUNTER — Encounter: Payer: Self-pay | Admitting: Internal Medicine

## 2022-03-23 ENCOUNTER — Ambulatory Visit (INDEPENDENT_AMBULATORY_CARE_PROVIDER_SITE_OTHER): Payer: Medicare Other | Admitting: Internal Medicine

## 2022-03-23 VITALS — BP 126/78 | HR 85 | Temp 98.1°F | Resp 16 | Ht 71.0 in | Wt 206.0 lb

## 2022-03-23 DIAGNOSIS — R5382 Chronic fatigue, unspecified: Secondary | ICD-10-CM | POA: Diagnosis not present

## 2022-03-23 DIAGNOSIS — F1097 Alcohol use, unspecified with alcohol-induced persisting dementia: Secondary | ICD-10-CM

## 2022-03-23 DIAGNOSIS — Z122 Encounter for screening for malignant neoplasm of respiratory organs: Secondary | ICD-10-CM

## 2022-03-23 DIAGNOSIS — F1027 Alcohol dependence with alcohol-induced persisting dementia: Secondary | ICD-10-CM | POA: Insufficient documentation

## 2022-03-23 DIAGNOSIS — Z720 Tobacco use: Secondary | ICD-10-CM

## 2022-03-23 DIAGNOSIS — D696 Thrombocytopenia, unspecified: Secondary | ICD-10-CM | POA: Diagnosis not present

## 2022-03-23 LAB — CBC WITH DIFFERENTIAL/PLATELET
Basophils Absolute: 0 10*3/uL (ref 0.0–0.1)
Basophils Relative: 0.5 % (ref 0.0–3.0)
Eosinophils Absolute: 0.1 10*3/uL (ref 0.0–0.7)
Eosinophils Relative: 1.3 % (ref 0.0–5.0)
HCT: 43.4 % (ref 39.0–52.0)
Hemoglobin: 14.7 g/dL (ref 13.0–17.0)
Lymphocytes Relative: 21.7 % (ref 12.0–46.0)
Lymphs Abs: 1.1 10*3/uL (ref 0.7–4.0)
MCHC: 33.8 g/dL (ref 30.0–36.0)
MCV: 91.2 fl (ref 78.0–100.0)
Monocytes Absolute: 0.7 10*3/uL (ref 0.1–1.0)
Monocytes Relative: 13.4 % — ABNORMAL HIGH (ref 3.0–12.0)
Neutro Abs: 3.3 10*3/uL (ref 1.4–7.7)
Neutrophils Relative %: 63.1 % (ref 43.0–77.0)
Platelets: 129 10*3/uL — ABNORMAL LOW (ref 150.0–400.0)
RBC: 4.76 Mil/uL (ref 4.22–5.81)
RDW: 13.6 % (ref 11.5–15.5)
WBC: 5.2 10*3/uL (ref 4.0–10.5)

## 2022-03-23 LAB — FOLATE: Folate: 10.3 ng/mL (ref >5.9)

## 2022-03-23 LAB — BASIC METABOLIC PANEL
BUN: 14 mg/dL (ref 6–23)
CO2: 27 mEq/L (ref 19–32)
Calcium: 9.3 mg/dL (ref 8.4–10.5)
Chloride: 102 mEq/L (ref 96–112)
Creatinine, Ser: 0.75 mg/dL (ref 0.40–1.50)
GFR: 90.89 mL/min (ref >60.00)
Glucose, Bld: 111 mg/dL — ABNORMAL HIGH (ref 70–99)
Potassium: 4.1 mEq/L (ref 3.5–5.1)
Sodium: 136 mEq/L (ref 135–145)

## 2022-03-23 LAB — HEPATIC FUNCTION PANEL
ALT: 26 U/L (ref 0–53)
AST: 28 U/L (ref 0–37)
Albumin: 4.3 g/dL (ref 3.5–5.2)
Alkaline Phosphatase: 69 U/L (ref 39–117)
Bilirubin, Direct: 0.4 mg/dL — ABNORMAL HIGH (ref 0.0–0.3)
Total Bilirubin: 1.5 mg/dL — ABNORMAL HIGH (ref 0.2–1.2)
Total Protein: 7.1 g/dL (ref 6.0–8.3)

## 2022-03-23 LAB — VITAMIN B12: Vitamin B-12: 588 pg/mL (ref 211–911)

## 2022-03-23 LAB — TSH: TSH: 1.35 u[IU]/mL (ref 0.35–5.50)

## 2022-03-23 NOTE — Patient Instructions (Signed)
Alcohol Misuse and Dependence Information, Adult Alcohol is a widely available drug and people choose to drink alcohol in different amounts. Alcohol misuse and dependence can have a negative effect on your life. Alcohol misuse is when you use alcohol too much or too often. You may have a hard time setting a limit on the amount you drink. Alcohol dependence is when you use alcohol consistently for a period of time, and your body changes as a result. Alcohol dependence can make it hard for you to stop drinking because you may start to feel sick or different when you do not drink alcohol. These symptoms are known as withdrawal. People who drink alcohol very often and in large amounts, may develop what is called an alcohol use disorder. How can alcohol misuse and dependence affect me? Drinking too much can lead to addiction. You may feel like you need alcohol to function normally. You may drink alcohol before work in the morning, during the day, or as soon as you get home from work in the evening. These actions can result in: Poor work performance. Job loss. Financial problems. Car crashes or criminal charges from driving after drinking alcohol. Problems in your relationships with friends and family. Losing the trust and respect of coworkers, friends, and family. Drinking heavily over a long period of time can permanently damage your body and brain, and can cause lifelong health issues, such as: Damage to your liver or pancreas. Heart problems, high blood pressure, or stroke. Certain cancers. Decreased ability to fight infections. Brain or nerve damage. Depression. Early death, also called premature death. If you are careless or you crave alcohol, it is easy to drink more than your body can handle (overdose). Alcohol overdose is a serious situation that requires hospitalization. It may lead to permanent injuries or death. What can increase my risk? Having a family history of alcohol misuse. Having  depression or other mental health conditions. Beginning to drink at an early age. Binge drinking often. Experiencing trauma, stress, and an unstable home life during childhood. Spending time with people who drink often. What actions can I take to prevent alcohol misuse and dependence? Do not drink alcohol if: Your health care provider tells you not to drink. You are pregnant, may be pregnant, or are planning to become pregnant. If you drink alcohol: Limit how much you have to: 0-1 drink a day for women who are not pregnant. 0-2 drinks a day for men. Know how much alcohol is in your drink. In the [place] one drink equals one 12 oz bottle of beer (355 mL), one 5 oz glass of wine (148 mL), or one 1 oz glass of hard liquor (44 mL). If you think you have an alcohol dependency problem, decide to stop drinking. This can be very hard to do if you are used to frequently drinking alcohol. If you begin to have withdrawal symptoms, talk with your health care provider or a person that you trust. These symptoms may include anxiety, shaky hands, headache, nausea, sweating, or not being able to sleep. Choose to drink nonalcoholic beverages in social gatherings and places where there may be alcohol. Activity Spend more time on activities that you enjoy that do not involve alcohol, like hobbies or exercise. Find healthy ways to cope with stress, such as meditation or spending time with people you care about. General information Talk to your family, coworkers, and friends about supporting you in your efforts to stop drinking. If they drink, ask them not to drink   around you. Spend more time with people who do not drink alcohol. If you think that you have an alcohol dependency problem: Tell friends or family about your concerns. Talk with your health care provider or another health professional about where to get help. Work with a therapist and a chemical dependency counselor. Consider joining a support group  for people who struggle with alcohol misuse and dependence. Where to find support  Your health care provider. SMART Recovery: smartrecovery.org Local treatment centers or chemical dependency counselors. Local AA groups in your community: aa.org Where to find more information Centers for Disease Control and Prevention: cdc.gov National Institute on Alcohol Abuse and Alcoholism: niaaa.nih.gov Alcoholics Anonymous (AA): aa.org Contact a health care provider if: You drank more or for longer than you intended on more than one occasion. You often drink to the point of vomiting or passing out. You have problems in your life due to drinking, but you continue to drink. You keep drinking even though you feel anxious, depressed, or have experienced memory loss. You have stopped doing the things you used to enjoy in order to drink. You have to drink more than you used to in order to get the effect you want. You experience anxiety, sweating, nausea, shakiness, and trouble sleeping when you try to stop drinking. Get help right away if: You have serious withdrawal symptoms, including: Confusion. Racing heart. High blood pressure. Fever. These symptoms may be an emergency. Get help right away. Call 911. Do not wait to see if the symptoms will go away. Do not drive yourself to the hospital. Also, get help right away if: You have thoughts about hurting yourself or others. Take one of these steps if you feel like you may hurt yourself or others, or have thoughts about taking your own life: Call 911. Call the National Suicide Prevention Lifeline at 1-800-273-8255 or 988. This is open 24 hours a day. Text the Crisis Text Line at 741741. Summary Alcohol misuse and dependence can have a negative effect on your life. Drinking too much or too often can lead to addiction. If you drink alcohol, limit how much you use. If you are having trouble keeping your drinking under control, find ways to change your  behavior. Hobbies, calming activities, exercise, or support groups can help. If you feel you need help with changing your drinking habits, talk with your health care provider, a good friend, or a therapist, or go to a support group. This information is not intended to replace advice given to you by your health care provider. Make sure you discuss any questions you have with your health care provider. Document Revised: 03/03/2021 Document Reviewed: 03/03/2021 Elsevier Patient Education  2023 Elsevier Inc.  

## 2022-03-23 NOTE — Progress Notes (Signed)
Subjective:  Patient ID: Keith Cobb, male    DOB: 03/08/1950  Age: 72 y.o. MRN: [id number]  CC: Alcohol Problem   HPI EOGHAN SALMO presents for f/up ----  He continues to drink about 3 beers a day.  He is active and denies chest pain, shortness of breath, diaphoresis, or edema.  He is with his wife today and they complain that they argue with each other.  Outpatient Medications Prior to Visit  Medication Sig Dispense Refill   DULoxetine (CYMBALTA) 60 MG capsule TAKE 1 CAPSULE BY MOUTH TWICE  DAILY 90 capsule 7   EPINEPHrine (EPIPEN 2-PAK) 0.3 mg/0.3 mL IJ SOAJ injection Inject 0.3 mg into the muscle as needed for anaphylaxis. 1 each 0   rosuvastatin (CRESTOR) 10 MG tablet Take 1 tablet (10 mg total) by mouth daily. 90 tablet 1   ARIPiprazole (ABILIFY) 2 MG tablet TAKE 1 TABLET BY MOUTH DAILY 90 tablet 1   No facility-administered medications prior to visit.    ROS Review of Systems  Constitutional:  Positive for fatigue and unexpected weight change (wt gain). Negative for chills, diaphoresis and fever.  HENT: Negative.    Eyes: Negative.  Negative for visual disturbance.  Respiratory:  Negative for cough, chest tightness, shortness of breath and wheezing.   Cardiovascular:  Negative for chest pain, palpitations and leg swelling.  Gastrointestinal: Negative.  Negative for abdominal pain, diarrhea, nausea and vomiting.  Endocrine: Negative.   Genitourinary: Negative.  Negative for difficulty urinating.  Musculoskeletal: Negative.   Skin: Negative.   Neurological: Negative.  Negative for dizziness, weakness and light-headedness.  Hematological:  Negative for adenopathy. Does not bruise/bleed easily.  Psychiatric/Behavioral:  Positive for behavioral problems, confusion, decreased concentration and sleep disturbance. Negative for agitation, dysphoric mood and suicidal ideas. The patient is not nervous/anxious.     Objective:  BP 126/78 (BP Location: Right Arm, Patient  Position: Sitting, Cuff Size: Large)   Pulse 85   Temp 98.1 F (36.7 C) (Oral)   Resp 16   Ht 5\' 11"  (1.803 m)   Wt 206 lb (93.4 kg)   SpO2 97%   BMI 28.73 kg/m   BP Readings from Last 3 Encounters:  03/23/22 126/78  12/15/21 128/76  09/09/21 138/78    Wt Readings from Last 3 Encounters:  03/23/22 206 lb (93.4 kg)  12/15/21 203 lb (92.1 kg)  09/09/21 206 lb 12.8 oz (93.8 kg)    Physical Exam Vitals reviewed.  Constitutional:      General: He is not in acute distress.    Appearance: He is ill-appearing. He is not toxic-appearing or diaphoretic.  HENT:     Nose: Nose normal.     Mouth/Throat:     Mouth: Mucous membranes are moist.  Eyes:     General: No scleral icterus.    Conjunctiva/sclera: Conjunctivae normal.  Cardiovascular:     Rate and Rhythm: Normal rate and regular rhythm.     Heart sounds: No murmur heard. Pulmonary:     Effort: Pulmonary effort is normal.     Breath sounds: No stridor. No wheezing, rhonchi or rales.  Abdominal:     General: Abdomen is protuberant. There is no distension. There are no signs of injury.     Palpations: There is no hepatomegaly, splenomegaly or mass.     Tenderness: There is no abdominal tenderness. There is no guarding.     Hernia: No hernia is present.  Musculoskeletal:        General: Normal  range of motion.     Cervical back: Neck supple.     Right lower leg: No edema.     Left lower leg: No edema.  Lymphadenopathy:     Cervical: No cervical adenopathy.  Skin:    General: Skin is warm and dry.     Coloration: Skin is not pale.  Neurological:     General: No focal deficit present.     Mental Status: He is alert. Mental status is at baseline.  Psychiatric:        Mood and Affect: Mood normal.        Behavior: Behavior normal.     Lab Results  Component Value Date   WBC 5.2 03/23/2022   HGB 14.7 03/23/2022   HCT 43.4 03/23/2022   PLT 129.0 (L) 03/23/2022   GLUCOSE 111 (H) 03/23/2022   CHOL 136 12/22/2021    TRIG 85.0 12/22/2021   HDL 61.30 12/22/2021   LDLCALC 58 12/22/2021   ALT 26 03/23/2022   AST 28 03/23/2022   NA 136 03/23/2022   K 4.1 03/23/2022   CL 102 03/23/2022   CREATININE 0.75 03/23/2022   BUN 14 03/23/2022   CO2 27 03/23/2022   TSH 1.35 03/23/2022   PSA 1.42 12/22/2021   HGBA1C 5.6 10/03/2019    VAS [place] ABI WITH/WO TBI  Result Date: 06/05/2020  LOWER EXTREMITY DOPPLER STUDY Patient Name:  LARRION RINGGER  Date of Exam:   06/05/2020 Medical Rec #: [id number]       Accession #:    [id number] Date of Birth: 1950/06/23       Patient Gender: M Patient Age:   69Y Exam Location:  [facility] Procedure:      VAS [place] ABI WITH/WO TBI Referring Phys: [id number] --------------------------------------------------------------------------------  Indications: Evaluation prior to knee surgery. High Risk Factors: Hypertension, current smoker. Other Factors: Patient complains of bilateral thigh pain when standing which is                relieved with sitting.  Performing Technologist: Delorise Shiner RVT  Examination Guidelines: A complete evaluation includes at minimum, Doppler waveform signals and systolic blood pressure reading at the level of bilateral brachial, anterior tibial, and posterior tibial arteries, when vessel segments are accessible. Bilateral testing is considered an integral part of a complete examination. Photoelectric Plethysmograph (PPG) waveforms and toe systolic pressure readings are included as required and additional duplex testing as needed. Limited examinations for reoccurring indications may be performed as noted.  ABI Findings: +---------+------------------+-----+---------+--------+ Right    Rt Pressure (mmHg)IndexWaveform Comment  +---------+------------------+-----+---------+--------+ Brachial 134                                      +---------+------------------+-----+---------+--------+ ATA      155               1.12                    +---------+------------------+-----+---------+--------+ PTA      157               1.13 triphasic         +---------+------------------+-----+---------+--------+ DP                              triphasic         +---------+------------------+-----+---------+--------+  Great Toe78                0.56                   +---------+------------------+-----+---------+--------+ +---------+------------------+-----+---------+-------+ Left     Lt Pressure (mmHg)IndexWaveform Comment +---------+------------------+-----+---------+-------+ Brachial 139                                     +---------+------------------+-----+---------+-------+ ATA      163               1.17                  +---------+------------------+-----+---------+-------+ PTA      136               0.98 triphasic        +---------+------------------+-----+---------+-------+ DP                              triphasic        +---------+------------------+-----+---------+-------+ Great Toe114               0.82                  +---------+------------------+-----+---------+-------+ +-------+-----------+-----------+------------+------------+ ABI/TBIToday's ABIToday's TBIPrevious ABIPrevious TBI +-------+-----------+-----------+------------+------------+ Right  1.13       0.56                                +-------+-----------+-----------+------------+------------+ Left   1.17       0.82                                +-------+-----------+-----------+------------+------------+   Summary: Right: Resting right ankle-brachial index is within normal range. No evidence of significant right lower extremity arterial disease. The right toe-brachial index is abnormal. RT great toe pressure = 78 mmHg. Left: Resting left ankle-brachial index is within normal range. No evidence of significant left lower extremity arterial disease. The left toe-brachial index is normal. LT Great toe pressure = 114  mmHg.  *See table(s) above for measurements and observations.  Electronically signed by Servando Snare MD on 06/05/2020 at 2:09:26 PM.    Final     Assessment & Plan:  Moderate dementia associated with alcoholism, with other behavioral disturbance ([place])- Will evaluate for vitamin deficiencies. -     Vitamin B12; Future -     CBC with Differential/Platelet; Future -     Hepatic function panel; Future -     Vitamin B1; Future -     Folate; Future  Chronic fatigue- Labs are negative for secondary causes. -     Basic metabolic panel; Future -     CBC with Differential/Platelet; Future -     TSH; Future -     Hepatic function panel; Future -     Folate; Future  Screening for lung cancer -     Ambulatory Referral for Lung Cancer Scre  Tobacco use -     Ambulatory Referral for Lung Cancer Scre  Thrombocytopenia ([place])- I have asked him to abstain from alcohol intake.     Follow-up: Return in about 4 months (around 07/23/2022).  Scarlette Calico, MD

## 2022-03-25 ENCOUNTER — Encounter: Payer: Self-pay | Admitting: Internal Medicine

## 2022-03-27 LAB — VITAMIN B1: Vitamin B1 (Thiamine): 10 nmol/L (ref 8–30)

## 2022-04-10 ENCOUNTER — Encounter: Payer: Self-pay | Admitting: Internal Medicine

## 2022-04-13 ENCOUNTER — Other Ambulatory Visit: Payer: Self-pay | Admitting: Family Medicine

## 2022-04-13 DIAGNOSIS — I10 Essential (primary) hypertension: Secondary | ICD-10-CM

## 2022-04-19 ENCOUNTER — Encounter: Payer: Self-pay | Admitting: Internal Medicine

## 2022-04-27 ENCOUNTER — Other Ambulatory Visit: Payer: Self-pay | Admitting: Internal Medicine

## 2022-04-27 DIAGNOSIS — I10 Essential (primary) hypertension: Secondary | ICD-10-CM

## 2022-04-27 MED ORDER — OLMESARTAN MEDOXOMIL 20 MG PO TABS: 20.0000 mg | ORAL_TABLET | Freq: Every day | ORAL | 0 refills | Status: DC

## 2022-06-25 ENCOUNTER — Other Ambulatory Visit: Payer: Self-pay | Admitting: Internal Medicine

## 2022-06-25 DIAGNOSIS — E785 Hyperlipidemia, unspecified: Secondary | ICD-10-CM

## 2022-06-28 ENCOUNTER — Other Ambulatory Visit: Payer: Self-pay | Admitting: Internal Medicine

## 2022-06-28 DIAGNOSIS — I10 Essential (primary) hypertension: Secondary | ICD-10-CM

## 2022-07-17 ENCOUNTER — Encounter: Payer: Self-pay | Admitting: Internal Medicine

## 2022-07-18 ENCOUNTER — Other Ambulatory Visit: Payer: Self-pay | Admitting: Internal Medicine

## 2022-07-18 DIAGNOSIS — E785 Hyperlipidemia, unspecified: Secondary | ICD-10-CM

## 2022-07-18 DIAGNOSIS — I10 Essential (primary) hypertension: Secondary | ICD-10-CM

## 2022-07-18 MED ORDER — ROSUVASTATIN CALCIUM 10 MG PO TABS: 10.0000 mg | ORAL_TABLET | Freq: Every day | ORAL | 0 refills | Status: DC

## 2022-07-18 MED ORDER — OLMESARTAN MEDOXOMIL 20 MG PO TABS: 20.0000 mg | ORAL_TABLET | Freq: Every day | ORAL | 0 refills | Status: DC

## 2022-07-25 DIAGNOSIS — H179 Unspecified corneal scar and opacity: Secondary | ICD-10-CM | POA: Diagnosis not present

## 2022-07-25 DIAGNOSIS — H04123 Dry eye syndrome of bilateral lacrimal glands: Secondary | ICD-10-CM | POA: Diagnosis not present

## 2022-07-25 DIAGNOSIS — H0102B Squamous blepharitis left eye, upper and lower eyelids: Secondary | ICD-10-CM | POA: Diagnosis not present

## 2022-07-25 DIAGNOSIS — H40053 Ocular hypertension, bilateral: Secondary | ICD-10-CM | POA: Diagnosis not present

## 2022-07-25 DIAGNOSIS — H0102A Squamous blepharitis right eye, upper and lower eyelids: Secondary | ICD-10-CM | POA: Diagnosis not present

## 2022-07-25 DIAGNOSIS — H2513 Age-related nuclear cataract, bilateral: Secondary | ICD-10-CM | POA: Diagnosis not present

## 2022-07-25 DIAGNOSIS — B0239 Other herpes zoster eye disease: Secondary | ICD-10-CM | POA: Diagnosis not present

## 2022-07-27 ENCOUNTER — Other Ambulatory Visit: Payer: Self-pay | Admitting: Family Medicine

## 2022-07-27 DIAGNOSIS — F3162 Bipolar disorder, current episode mixed, moderate: Secondary | ICD-10-CM

## 2022-08-01 ENCOUNTER — Encounter: Payer: Self-pay | Admitting: Emergency Medicine

## 2022-08-13 ENCOUNTER — Other Ambulatory Visit: Payer: Self-pay | Admitting: Family Medicine

## 2022-08-13 DIAGNOSIS — F3162 Bipolar disorder, current episode mixed, moderate: Secondary | ICD-10-CM

## 2022-09-08 ENCOUNTER — Other Ambulatory Visit: Payer: Self-pay | Admitting: Family Medicine

## 2022-09-08 DIAGNOSIS — F3162 Bipolar disorder, current episode mixed, moderate: Secondary | ICD-10-CM

## 2022-09-18 ENCOUNTER — Other Ambulatory Visit: Payer: Self-pay | Admitting: Internal Medicine

## 2022-09-18 DIAGNOSIS — I10 Essential (primary) hypertension: Secondary | ICD-10-CM

## 2022-09-25 ENCOUNTER — Other Ambulatory Visit: Payer: Self-pay | Admitting: Internal Medicine

## 2022-09-25 DIAGNOSIS — I10 Essential (primary) hypertension: Secondary | ICD-10-CM

## 2022-09-25 DIAGNOSIS — E785 Hyperlipidemia, unspecified: Secondary | ICD-10-CM

## 2022-09-28 ENCOUNTER — Encounter: Payer: Self-pay | Admitting: Internal Medicine

## 2022-09-28 ENCOUNTER — Other Ambulatory Visit: Payer: Self-pay | Admitting: Internal Medicine

## 2022-09-28 DIAGNOSIS — E785 Hyperlipidemia, unspecified: Secondary | ICD-10-CM

## 2022-10-03 ENCOUNTER — Encounter: Payer: Self-pay | Admitting: Internal Medicine

## 2022-10-26 ENCOUNTER — Encounter: Payer: Self-pay | Admitting: Internal Medicine

## 2022-10-26 ENCOUNTER — Ambulatory Visit (INDEPENDENT_AMBULATORY_CARE_PROVIDER_SITE_OTHER): Payer: Medicare Other | Admitting: Internal Medicine

## 2022-10-26 VITALS — BP 106/64 | HR 89 | Temp 98.2°F | Ht 71.0 in | Wt 205.0 lb

## 2022-10-26 DIAGNOSIS — I950 Idiopathic hypotension: Secondary | ICD-10-CM | POA: Diagnosis not present

## 2022-10-26 DIAGNOSIS — D696 Thrombocytopenia, unspecified: Secondary | ICD-10-CM | POA: Diagnosis not present

## 2022-10-26 DIAGNOSIS — I10 Essential (primary) hypertension: Secondary | ICD-10-CM

## 2022-10-26 DIAGNOSIS — E785 Hyperlipidemia, unspecified: Secondary | ICD-10-CM | POA: Diagnosis not present

## 2022-10-26 DIAGNOSIS — R9431 Abnormal electrocardiogram [ECG] [EKG]: Secondary | ICD-10-CM

## 2022-10-26 DIAGNOSIS — F1027 Alcohol dependence with alcohol-induced persisting dementia: Secondary | ICD-10-CM

## 2022-10-26 DIAGNOSIS — Z122 Encounter for screening for malignant neoplasm of respiratory organs: Secondary | ICD-10-CM

## 2022-10-26 DIAGNOSIS — Z720 Tobacco use: Secondary | ICD-10-CM

## 2022-10-26 LAB — CBC WITH DIFFERENTIAL/PLATELET
Basophils Absolute: 0 10*3/uL (ref 0.0–0.1)
Basophils Relative: 0.4 % (ref 0.0–3.0)
Eosinophils Absolute: 0.1 10*3/uL (ref 0.0–0.7)
Eosinophils Relative: 2.1 % (ref 0.0–5.0)
HCT: 45.1 % (ref 39.0–52.0)
Hemoglobin: 14.6 g/dL (ref 13.0–17.0)
Lymphocytes Relative: 19.3 % (ref 12.0–46.0)
Lymphs Abs: 1.2 10*3/uL (ref 0.7–4.0)
MCHC: 32.4 g/dL (ref 30.0–36.0)
MCV: 90.7 fL (ref 78.0–100.0)
Monocytes Absolute: 0.9 10*3/uL (ref 0.1–1.0)
Monocytes Relative: 13.3 % — ABNORMAL HIGH (ref 3.0–12.0)
Neutro Abs: 4.2 10*3/uL (ref 1.4–7.7)
Neutrophils Relative %: 64.9 % (ref 43.0–77.0)
Platelets: 149 10*3/uL — ABNORMAL LOW (ref 150.0–400.0)
RBC: 4.98 Mil/uL (ref 4.22–5.81)
RDW: 14.7 % (ref 11.5–15.5)
WBC: 6.4 10*3/uL (ref 4.0–10.5)

## 2022-10-26 LAB — BRAIN NATRIURETIC PEPTIDE: Pro B Natriuretic peptide (BNP): 51 pg/mL (ref 0.0–100.0)

## 2022-10-26 LAB — FOLATE: Folate: 14.8 ng/mL (ref >5.9)

## 2022-10-26 LAB — BASIC METABOLIC PANEL
BUN: 14 mg/dL (ref 6–23)
CO2: 28 meq/L (ref 19–32)
Calcium: 9.7 mg/dL (ref 8.4–10.5)
Chloride: 101 meq/L (ref 96–112)
Creatinine, Ser: 0.83 mg/dL (ref 0.40–1.50)
GFR: 87.78 mL/min (ref >60.00)
Glucose, Bld: 97 mg/dL (ref 70–99)
Potassium: 4.2 meq/L (ref 3.5–5.1)
Sodium: 137 meq/L (ref 135–145)

## 2022-10-26 LAB — HEPATIC FUNCTION PANEL
ALT: 24 U/L (ref 0–53)
AST: 25 U/L (ref 0–37)
Albumin: 4.5 g/dL (ref 3.5–5.2)
Alkaline Phosphatase: 65 U/L (ref 39–117)
Bilirubin, Direct: 0.3 mg/dL (ref 0.0–0.3)
Total Bilirubin: 1.3 mg/dL — ABNORMAL HIGH (ref 0.2–1.2)
Total Protein: 7.5 g/dL (ref 6.0–8.3)

## 2022-10-26 LAB — TROPONIN I (HIGH SENSITIVITY): High Sens Troponin I: 6 ng/L (ref 2–17)

## 2022-10-26 LAB — VITAMIN B12: Vitamin B-12: 743 pg/mL (ref 211–911)

## 2022-10-26 LAB — TSH: TSH: 1.23 u[IU]/mL (ref 0.35–5.50)

## 2022-10-26 LAB — CORTISOL: Cortisol, Plasma: 7.6 ug/dL

## 2022-10-26 NOTE — Patient Instructions (Signed)
Thrombocytopenia Thrombocytopenia is a condition in which there are a low number of platelets in the blood. Platelets are also called thrombocytes. Platelets are parts of blood that stick together and form a clot to help the body stop bleeding after an injury. If you have too few platelets, your blood may have trouble clotting. This may cause you to bleed and bruise very easily. Some cases of thrombocytopenia are mild while others are more severe. What are the causes? This condition is caused by a low number of platelets in your blood. There are three main reasons for this: Your body not making enough platelets. This may be caused by: Bone marrow diseases. This include aplastic anemia, leukemia, and myelodysplastic anemia. Congenital thrombocytopenia. This is a condition that is passed from parent to child (inherited). Certain cancer treatments, including chemotherapy and radiation therapy. Infections from bacteria or viruses. Alcohol use disorder and alcoholism. Platelets not being released in the blood. This is called platelet sequestration and it can happen due to: An overactive spleen (hypersplenism). The spleen gathers up platelets from circulation, meaning that the platelets are not available to help with clotting your blood. The spleen can be enlarged because of scarring or other conditions. Gaucher disease. Your body destroying platelets too quickly. This may be caused by: An autoimmune disease that causes immune thrombocytopenia (ITP). ITP is sometimes associated with other autoimmune conditions such as lupus. Certain medicines, such as blood thinners. Certain blood clotting or bleeding disorders. Exposure to toxic chemicals, such as pesticides, lead, benzene, and arsenic. Pregnancy. What are the signs or symptoms? Symptoms of this condition are the result of poor blood clotting. They will vary depending on how low the platelet counts are. Symptoms may include: Bruising  easily. Bleeding from the mouth or nose. Heavy menstrual periods. Blood in the urine, stool (feces), or vomit. Purplish-red discolorations on the skin (purpura). A rash that looks like pinpoint, purplish-red spots (petechiae) on the lower legs. How is this diagnosed?  This condition may be diagnosed with blood tests and a physical exam. You may also have other tests, including: A sample of bone marrow (biopsy) may be removed to look for the original cells that make platelets. An ultrasound or CT scan of the abdomen to check for an enlarged spleen, enlarged lymph nodes, or liver problems. How is this treated? Treatment for this condition depends on the cause. Treatment may include: Treatment of another condition that is causing the low platelet count. Medicines to help protect your platelets from being destroyed. A replacement (transfusion) of platelets to stop or prevent bleeding. Surgery to remove the spleen. Follow these instructions at home: Medicines Take over-the-counter and prescription medicines only as told by your health care provider. Do not take any medicines that contain aspirin or NSAIDs, such as ibuprofen. These medicines increase your risk for dangerous bleeding. Activity Avoid activities that could cause injury or bruising, and follow instructions about how to prevent falls. Do not play contact sports. Ask your health care provider what activities are safe for you. Take extra care to protect yourself from burns when ironing or cooking. Take extra care not to cut yourself when you shave or when you use scissors, needles, knives, and other tools. General instructions  Check your skin and the inside of your mouth for bruising or bleeding as told by your health care provider. Wear a medical alert bracelet that says that you have a bleeding disorder. This can help you get the treatment you need in case of emergency. Check  your urine and stool for blood as told by your health  care provider. Do not drink alcohol. If you do drink alcohol, limit the amount that you drink. Minimize contact with toxic chemicals. Tell all your health care providers, including dental care providers and eye doctors, about your condition. Make sure to tell dental care providers before you have any procedure done, including dental cleanings. Keep all follow-up visits. This is important. Contact a health care provider if: You have unexplained bruising. You have new symptoms. You have symptoms that get worse. You have a fever. Get help right away if: You have severe bleeding from anywhere on your body. You have blood in your vomit, urine, or stool. You have an injury to your head. You have a sudden, severe headache. Summary Thrombocytopenia is a condition in which you have a low number of platelets in the blood. Platelets are parts of blood that stick together to form a clot. Symptoms of this condition are the result of poor blood clotting and may include bruising easily, bleeding from the nose or mouth, petechiae, and purpura. This condition may be diagnosed with blood tests and a physical exam. Treatment for this condition depends on the cause. This information is not intended to replace advice given to you by your health care provider. Make sure you discuss any questions you have with your health care provider. Document Revised: 06/11/2020 Document Reviewed: 06/11/2020 Elsevier Patient Education  2024 [employer].

## 2022-10-26 NOTE — Progress Notes (Signed)
Subjective:  Patient ID: Keith Cobb, male    DOB: [birth year]  Age: 72 y.o. MRN: [id number]  CC: Hypertension and Hyperlipidemia   HPI Keith Cobb presents for f/up ----    Discussed the use of AI scribe software for clinical note transcription with the patient, who gave verbal consent to proceed.  History of Present Illness   He denied experiencing any symptoms such as weakness, dizziness, or lightheadedness. The patient admitted to continued smoking and drinking, albeit in reduced quantities, with the last cigarette and alcoholic drink consumed the previous night. He reported a daily intake of four cigarettes and four beers, sometimes less.  The patient also mentioned a recent emotional stressor, the loss of a close friend to colon cancer, which has had a significant impact on his mental well-being. He denied any physical symptoms related to this event.  The patient also reported a persistent issue with his knee, which has not improved and requires the use of a knee brace for support. He denied any recent changes in his medication regimen. The patient also mentioned occasional dizziness but denied any chest pain or shortness of breath. He reported self-managing these episodes by resting when needed.       Outpatient Medications Prior to Visit  Medication Sig Dispense Refill   DULoxetine (CYMBALTA) 60 MG capsule TAKE 1 CAPSULE BY MOUTH TWICE  DAILY 90 capsule 7   EPINEPHrine (EPIPEN 2-PAK) 0.3 mg/0.3 mL IJ SOAJ injection Inject 0.3 mg into the muscle as needed for anaphylaxis. 1 each 0   rosuvastatin (CRESTOR) 10 MG tablet Take 1 tablet (10 mg total) by mouth daily. 90 tablet 0   olmesartan (BENICAR) 20 MG tablet Take 1 tablet (20 mg total) by mouth daily. 90 tablet 0   No facility-administered medications prior to visit.    ROS Review of Systems  Constitutional: Negative.  Negative for appetite change, chills, diaphoresis and fatigue.  HENT: Negative.    Eyes: Negative.    Respiratory: Negative.  Negative for cough, chest tightness, shortness of breath and wheezing.   Cardiovascular:  Negative for chest pain, palpitations and leg swelling.  Gastrointestinal:  Negative for abdominal pain, constipation, diarrhea, nausea and vomiting.  Genitourinary: Negative.  Negative for difficulty urinating.  Musculoskeletal: Negative.  Negative for arthralgias and myalgias.  Skin: Negative.   Neurological: Negative.  Negative for dizziness, weakness and numbness.  Hematological:  Negative for adenopathy. Does not bruise/bleed easily.  Psychiatric/Behavioral:  Positive for confusion and decreased concentration. Negative for agitation, behavioral problems, hallucinations, sleep disturbance and suicidal ideas. The patient is not nervous/anxious and is not hyperactive.     Objective:  BP 106/64 (BP Location: Right Arm, Patient Position: Sitting, Cuff Size: Large)   Pulse 89   Temp 98.2 F (36.8 C) (Oral)   Ht 5\' 11"  (1.803 m)   Wt 205 lb (93 kg)   SpO2 97%   BMI 28.59 kg/m   BP Readings from Last 3 Encounters:  10/26/22 106/64  03/23/22 126/78  12/15/21 128/76    Wt Readings from Last 3 Encounters:  10/26/22 205 lb (93 kg)  03/23/22 206 lb (93.4 kg)  12/15/21 203 lb (92.1 kg)    Physical Exam Vitals reviewed.  HENT:     Nose: Nose normal.     Mouth/Throat:     Mouth: Mucous membranes are moist.  Eyes:     General: No scleral icterus.    Conjunctiva/sclera: Conjunctivae normal.  Cardiovascular:     Rate and Rhythm:  Normal rate and regular rhythm.     Heart sounds: Normal heart sounds, S1 normal and S2 normal. Heart sounds not distant. No murmur heard.    No gallop.     Comments: EKG- NSR, 87 bpm Incomplete RBBB NS ST abnormality No LVH Unchanged Pulmonary:     Effort: Pulmonary effort is normal.     Breath sounds: No stridor. Examination of the right-lower field reveals rhonchi. Examination of the left-lower field reveals rhonchi. Rhonchi present.  No wheezing or rales.  Abdominal:     General: Abdomen is protuberant. Bowel sounds are normal. There is no distension.     Palpations: Abdomen is soft. There is no hepatomegaly, splenomegaly or mass.     Tenderness: There is no abdominal tenderness. There is no guarding.  Musculoskeletal:     Cervical back: Neck supple.     Right lower leg: No edema.     Left lower leg: No edema.  Skin:    General: Skin is warm.  Neurological:     General: No focal deficit present.     Mental Status: He is alert. Mental status is at baseline.  Psychiatric:        Attention and Perception: He is inattentive.        Mood and Affect: Mood and affect normal.        Speech: Speech is delayed and tangential.        Behavior: Behavior normal. Behavior is cooperative.        Thought Content: Thought content normal. Thought content is not paranoid or delusional. Thought content does not include homicidal or suicidal ideation.        Cognition and Memory: Cognition normal.        Judgment: Judgment normal.     Lab Results  Component Value Date   WBC 6.4 10/26/2022   HGB 14.6 10/26/2022   HCT 45.1 10/26/2022   PLT 149.0 (L) 10/26/2022   GLUCOSE 97 10/26/2022   CHOL 136 12/22/2021   TRIG 85.0 12/22/2021   HDL 61.30 12/22/2021   LDLCALC 58 12/22/2021   ALT 24 10/26/2022   AST 25 10/26/2022   NA 137 10/26/2022   K 4.2 10/26/2022   CL 101 10/26/2022   CREATININE 0.83 10/26/2022   BUN 14 10/26/2022   CO2 28 10/26/2022   TSH 1.23 10/26/2022   PSA 1.42 12/22/2021   HGBA1C 5.6 10/03/2019    VAS [place] ABI WITH/WO TBI  Result Date: 06/05/2020  LOWER EXTREMITY DOPPLER STUDY Patient Name:  ELAINE BASORE  Date of Exam:   06/05/2020 Medical Rec #: [id number]       Accession #:    [id number] Date of Birth: April 20, 1950       Patient Gender: M Patient Age:   69Y Exam Location:  [facility] Procedure:      VAS [place] ABI WITH/WO TBI Referring Phys: [id number] SAMANTHA J RHYNE  --------------------------------------------------------------------------------  Indications: Evaluation prior to knee surgery. High Risk Factors: Hypertension, current smoker. Other Factors: Patient complains of bilateral thigh pain when standing which is                relieved with sitting.  Performing Technologist: Hardie Lora RVT  Examination Guidelines: A complete evaluation includes at minimum, Doppler waveform signals and systolic blood pressure reading at the level of bilateral brachial, anterior tibial, and posterior tibial arteries, when vessel segments are accessible. Bilateral testing is considered an integral part of a complete examination. Photoelectric Plethysmograph (PPG) waveforms  and toe systolic pressure readings are included as required and additional duplex testing as needed. Limited examinations for reoccurring indications may be performed as noted.  ABI Findings: +---------+------------------+-----+---------+--------+ Right    Rt Pressure (mmHg)IndexWaveform Comment  +---------+------------------+-----+---------+--------+ Brachial 134                                      +---------+------------------+-----+---------+--------+ ATA      155               1.12                   +---------+------------------+-----+---------+--------+ PTA      157               1.13 triphasic         +---------+------------------+-----+---------+--------+ DP                              triphasic         +---------+------------------+-----+---------+--------+ Great Toe78                0.56                   +---------+------------------+-----+---------+--------+ +---------+------------------+-----+---------+-------+ Left     Lt Pressure (mmHg)IndexWaveform Comment +---------+------------------+-----+---------+-------+ Brachial 139                                     +---------+------------------+-----+---------+-------+ ATA      163               1.17                   +---------+------------------+-----+---------+-------+ PTA      136               0.98 triphasic        +---------+------------------+-----+---------+-------+ DP                              triphasic        +---------+------------------+-----+---------+-------+ Great Toe114               0.82                  +---------+------------------+-----+---------+-------+ +-------+-----------+-----------+------------+------------+ ABI/TBIToday's ABIToday's TBIPrevious ABIPrevious TBI +-------+-----------+-----------+------------+------------+ Right  1.13       0.56                                +-------+-----------+-----------+------------+------------+ Left   1.17       0.82                                +-------+-----------+-----------+------------+------------+   Summary: Right: Resting right ankle-brachial index is within normal range. No evidence of significant right lower extremity arterial disease. The right toe-brachial index is abnormal. RT great toe pressure = 78 mmHg. Left: Resting left ankle-brachial index is within normal range. No evidence of significant left lower extremity arterial disease. The left toe-brachial index is normal. LT Great toe pressure = 114 mmHg.  *See table(s) above for measurements and observations.  Electronically signed by Lemar Livings MD on 06/05/2020 at  2:09:26 PM.    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 -     CBC with Differential/Platelet; Future -     Vitamin B1; Future -     Vitamin B12; Future -     Folate; Future  Abnormal electrocardiogram (ECG) (EKG)- Will evaluate with an ECHO -     Troponin I (High Sensitivity); Future -     Brain natriuretic peptide; Future -     ECHOCARDIOGRAM COMPLETE; Future  Moderate dementia associated with alcoholism, without behavioral disturbance, psychotic disturbance, mood disturbance, or anxiety (HCC) -     Vitamin B1; Future  Screening  for lung cancer -     Ambulatory Referral for Lung Cancer Scre  Tobacco use -     Ambulatory Referral for Lung Cancer Scre  Primary hypertension- BP is over-controlled. Will discontinue the ARB. -     Basic metabolic panel; Future -     Urinalysis, Routine w reflex microscopic; Future -     EKG 12-Lead  Dyslipidemia, goal LDL below 130 -     TSH; Future -     Hepatic function panel; Future  Idiopathic hypotension - Labs are negative for secondary causes. -     TSH; Future -     Cortisol; Future -     Basic metabolic panel; Future -     Urinalysis, Routine w reflex microscopic; Future -     Troponin I (High Sensitivity); Future -     Brain natriuretic peptide; Future -     ECHOCARDIOGRAM COMPLETE; Future     Follow-up: Return in about 3 months (around 01/26/2023).  Sanda Linger, MD

## 2022-10-27 LAB — URINALYSIS, ROUTINE W REFLEX MICROSCOPIC
Bilirubin Urine: NEGATIVE
Hgb urine dipstick: NEGATIVE
Ketones, ur: NEGATIVE
Nitrite: NEGATIVE
Specific Gravity, Urine: >=1.03 — AB (ref 1.000–1.030)
Total Protein, Urine: NEGATIVE
Urine Glucose: NEGATIVE
Urobilinogen, UA: 1 (ref 0.0–1.0)
pH: 6 (ref 5.0–8.0)

## 2022-10-28 ENCOUNTER — Encounter: Payer: Self-pay | Admitting: Internal Medicine

## 2022-10-30 LAB — VITAMIN B1: Vitamin B1 (Thiamine): 10 nmol/L (ref 8–30)

## 2022-11-02 ENCOUNTER — Other Ambulatory Visit: Payer: Self-pay | Admitting: Internal Medicine

## 2022-11-02 DIAGNOSIS — F331 Major depressive disorder, recurrent, moderate: Secondary | ICD-10-CM

## 2022-11-02 DIAGNOSIS — F3162 Bipolar disorder, current episode mixed, moderate: Secondary | ICD-10-CM

## 2022-11-02 DIAGNOSIS — M1712 Unilateral primary osteoarthritis, left knee: Secondary | ICD-10-CM

## 2022-11-02 DIAGNOSIS — E785 Hyperlipidemia, unspecified: Secondary | ICD-10-CM

## 2022-11-02 MED ORDER — DULOXETINE HCL 60 MG PO CPEP
60.0000 mg | ORAL_CAPSULE | Freq: Every day | ORAL | 0 refills | Status: DC
Start: 2022-11-02 — End: 2022-12-26

## 2022-11-02 MED ORDER — ROSUVASTATIN CALCIUM 10 MG PO TABS
10.0000 mg | ORAL_TABLET | Freq: Every day | ORAL | 0 refills | Status: DC
Start: 2022-11-02 — End: 2022-12-26

## 2022-11-08 ENCOUNTER — Ambulatory Visit: Payer: Medicare Other | Admitting: Internal Medicine

## 2022-11-16 ENCOUNTER — Other Ambulatory Visit (HOSPITAL_BASED_OUTPATIENT_CLINIC_OR_DEPARTMENT_OTHER): Payer: Medicare Other

## 2022-12-01 ENCOUNTER — Ambulatory Visit (HOSPITAL_COMMUNITY): Payer: Medicare Other

## 2022-12-26 ENCOUNTER — Encounter: Payer: Self-pay | Admitting: Internal Medicine

## 2022-12-26 ENCOUNTER — Other Ambulatory Visit: Payer: Self-pay | Admitting: Internal Medicine

## 2022-12-26 DIAGNOSIS — F331 Major depressive disorder, recurrent, moderate: Secondary | ICD-10-CM

## 2022-12-26 DIAGNOSIS — E785 Hyperlipidemia, unspecified: Secondary | ICD-10-CM

## 2022-12-26 DIAGNOSIS — M1712 Unilateral primary osteoarthritis, left knee: Secondary | ICD-10-CM

## 2022-12-27 MED ORDER — ROSUVASTATIN CALCIUM 10 MG PO TABS
10.0000 mg | ORAL_TABLET | Freq: Every day | ORAL | 0 refills | Status: DC
Start: 2022-12-27 — End: 2023-03-13

## 2022-12-27 MED ORDER — DULOXETINE HCL 60 MG PO CPEP
60.0000 mg | ORAL_CAPSULE | Freq: Every day | ORAL | 0 refills | Status: DC
Start: 2022-12-27 — End: 2023-03-23

## 2023-01-26 ENCOUNTER — Encounter: Payer: Medicare Other | Admitting: Internal Medicine

## 2023-02-02 ENCOUNTER — Encounter: Payer: Self-pay | Admitting: Internal Medicine

## 2023-02-02 NOTE — Telephone Encounter (Signed)
Does patient need to be seen?   Please advise

## 2023-02-04 ENCOUNTER — Emergency Department (HOSPITAL_COMMUNITY)
Admission: EM | Admit: 2023-02-04 | Discharge: 2023-02-04 | Disposition: A | Discharge status: Home / Self Care | Payer: Medicare Other | Attending: Emergency Medicine | Admitting: Emergency Medicine

## 2023-02-04 ENCOUNTER — Other Ambulatory Visit: Payer: Self-pay

## 2023-02-04 ENCOUNTER — Encounter (HOSPITAL_COMMUNITY): Payer: Self-pay

## 2023-02-04 DIAGNOSIS — F10120 Alcohol abuse with intoxication, uncomplicated: Secondary | ICD-10-CM | POA: Diagnosis present

## 2023-02-04 DIAGNOSIS — R410 Disorientation, unspecified: Secondary | ICD-10-CM | POA: Diagnosis not present

## 2023-02-04 DIAGNOSIS — Z79899 Other long term (current) drug therapy: Secondary | ICD-10-CM | POA: Insufficient documentation

## 2023-02-04 DIAGNOSIS — F10920 Alcohol use, unspecified with intoxication, uncomplicated: Secondary | ICD-10-CM

## 2023-02-04 DIAGNOSIS — Y905 Blood alcohol level of 100-119 mg/100 ml: Secondary | ICD-10-CM | POA: Insufficient documentation

## 2023-02-04 DIAGNOSIS — R069 Unspecified abnormalities of breathing: Secondary | ICD-10-CM | POA: Diagnosis not present

## 2023-02-04 LAB — ETHANOL: Alcohol, Ethyl (B): 100 mg/dL — ABNORMAL HIGH (ref <10)

## 2023-02-04 LAB — COMPREHENSIVE METABOLIC PANEL
ALT: 22 U/L (ref 0–44)
AST: 24 U/L (ref 15–41)
Albumin: 4.3 g/dL (ref 3.5–5.0)
Alkaline Phosphatase: 63 U/L (ref 38–126)
Anion gap: 13 (ref 5–15)
BUN: 13 mg/dL (ref 8–23)
CO2: 22 mmol/L (ref 22–32)
Calcium: 9.5 mg/dL (ref 8.9–10.3)
Chloride: 100 mmol/L (ref 98–111)
Creatinine, Ser: 0.59 mg/dL — ABNORMAL LOW (ref 0.61–1.24)
GFR, Estimated: >60 mL/min (ref >60)
Glucose, Bld: 90 mg/dL (ref 70–99)
Potassium: 3.6 mmol/L (ref 3.5–5.1)
Sodium: 135 mmol/L (ref 135–145)
Total Bilirubin: 1.7 mg/dL — ABNORMAL HIGH (ref 0.0–1.2)
Total Protein: 7.3 g/dL (ref 6.5–8.1)

## 2023-02-04 LAB — CBC
HCT: 41.5 % (ref 39.0–52.0)
Hemoglobin: 13.8 g/dL (ref 13.0–17.0)
MCH: 29.3 pg (ref 26.0–34.0)
MCHC: 33.3 g/dL (ref 30.0–36.0)
MCV: 88.1 fL (ref 80.0–100.0)
Platelets: 139 10*3/uL — ABNORMAL LOW (ref 150–400)
RBC: 4.71 MIL/uL (ref 4.22–5.81)
RDW: 12.8 % (ref 11.5–15.5)
WBC: 4.5 10*3/uL (ref 4.0–10.5)
nRBC: 0 % (ref 0.0–0.2)

## 2023-02-04 NOTE — ED Triage Notes (Signed)
BIB EMS from home. Pt is an alcoholic and drinks a case of beer everyday. Pt had 12 beers tonight and was stating he wanted to end things, pt has weapons in the home and pt wife was concerned that pt was going to hurt himself. Pt denies SI and HI. Pt is alert, but disoriented to his age and the year.

## 2023-02-04 NOTE — ED Provider Notes (Signed)
[place] EMERGENCY DEPARTMENT AT [facility] Provider Note   CSN: [id number] Arrival date & time: 02/04/23  1939     History  Chief Complaint  Patient presents with   Alcohol Intoxication    DAEMION MCNIEL is a 73 y.o. male.   Alcohol Intoxication  73 year old male here for intoxication.  Patient reportedly called EMS.  He was drinking tonight because he drinks frequently.  He presents intoxicated and stating that he did not want to do this anymore making vague statements.  EMS was concerned because there are weapons in the home that he could be suicidal so brought him here.  Here patient is clinically sober.  He denies any suicidal or homicidal ideation.  He states he was tired of drinking and intoxicated but he never had any thoughts of hurting himself or others.  No AVH.  His wife is here at bedside.  She has not had any concerns for self-harm, and feels patient is safe at home.  No concerns for SI or HI or AVH.  He is not using other drugs.  Physically he is well.  The wife with the weapons away at home, so he will not have access to them.  She feels comfortable with him going home, and everyone feels safe with this.  No psychiatric history.     Home Medications Prior to Admission medications   Medication Sig Start Date End Date Taking? Authorizing Provider  DULoxetine (CYMBALTA) 60 MG capsule Take 1 capsule (60 mg total) by mouth daily. 12/27/22   Etta Grandchild, MD  EPINEPHrine (EPIPEN 2-PAK) 0.3 mg/0.3 mL IJ SOAJ injection Inject 0.3 mg into the muscle as needed for anaphylaxis. 08/18/21   [name], MD  rosuvastatin (CRESTOR) 10 MG tablet Take 1 tablet (10 mg total) by mouth daily. 12/27/22   Etta Grandchild, MD      Allergies    Lisinopril and Bee venom    Review of Systems   Review of Systems Review of systems completed and notable as per HPI.  ROS otherwise negative.   Physical Exam Updated Vital Signs BP (!) 129/102 (BP Location: Right Arm)    Pulse 82   Temp 97.9 F (36.6 C) (Oral)   Resp 18   Ht 5\' 11"  (1.803 m)   Wt 93 kg   SpO2 99%   BMI 28.60 kg/m  Physical Exam Vitals and nursing note reviewed.  Constitutional:      General: He is not in acute distress.    Appearance: He is well-developed.  HENT:     Head: Normocephalic and atraumatic.  Eyes:     Conjunctiva/sclera: Conjunctivae normal.  Cardiovascular:     Rate and Rhythm: Normal rate and regular rhythm.     Pulses: Normal pulses.     Heart sounds: Normal heart sounds. No murmur heard. Pulmonary:     Effort: Pulmonary effort is normal. No respiratory distress.     Breath sounds: Normal breath sounds.  Abdominal:     Palpations: Abdomen is soft.     Tenderness: There is no abdominal tenderness.  Musculoskeletal:        General: No swelling.     Cervical back: Neck supple.  Skin:    General: Skin is warm and dry.     Capillary Refill: Capillary refill takes less than 2 seconds.  Neurological:     Mental Status: He is alert.  Psychiatric:        Mood and Affect: Mood normal.  ED Results / Procedures / Treatments   Labs (all labs ordered are listed, but only abnormal results are displayed) Labs Reviewed  COMPREHENSIVE METABOLIC PANEL - Abnormal; Notable for the following components:      Result Value   Creatinine, Ser 0.59 (*)    Total Bilirubin 1.7 (*)    All other components within normal limits  ETHANOL - Abnormal; Notable for the following components:   Alcohol, Ethyl (B) 100 (*)    All other components within normal limits  CBC - Abnormal; Notable for the following components:   Platelets 139 (*)    All other components within normal limits  RAPID URINE DRUG SCREEN, HOSP PERFORMED    EKG None  Radiology No results found.  Procedures Procedures    Medications Ordered in ED Medications - No data to display  ED Course/ Medical Decision Making/ A&P                                 Medical Decision Making Amount and/or  Complexity of Data Reviewed Labs: ordered.   Medical Decision Making:   SHERIF MILLSPAUGH is a 73 y.o. male who presented to the ED today with intoxication.  Per report he was making some vague gestures about wanting to stop drinking and there was concern that he could be suicidal.  Patient and wife are both here.  They both state they have no concerns for SI, HI or his safety.  The wife but they weapons at home away so he will not have access them.  She has no concerns and feels like he is safe to go home.  He is clinically sober, he states he regrets saying what he did and he just got scared.  He does want to stop drinking.  He is not clinically withdrawing.  He has no SI or HI and states he has a lot to live for.  I do not see any indication for IVC, I suspect he was intoxicated and collateral from family is very reassuring.  Given this I think he can be safely discharged.  I did give them strict return precautions.  Wife is going to take patient home.   Additional history discussed with patient's family/caregivers.  Reviewed and confirmed nursing documentation for past medical history, family history, social history.  Patient's presentation is most consistent with acute complicated illness / injury requiring diagnostic workup.           Final Clinical Impression(s) / ED Diagnoses Final diagnoses:  Alcoholic intoxication without complication [facility])    Rx / DC Orders ED Discharge Orders     None         Laurence Spates, MD 02/04/23 2249

## 2023-02-04 NOTE — Discharge Instructions (Signed)
Follow up with your primary care doctor.

## 2023-02-06 ENCOUNTER — Telehealth: Payer: Self-pay

## 2023-02-06 NOTE — Transitions of Care (Post Inpatient/ED Visit) (Signed)
   02/06/2023  Name: Keith Cobb MRN: [id number] DOB: 1950-10-04  Today's TOC FU Call Status: Today's TOC FU Call Status:: Successful TOC FU Call Completed TOC FU Call Complete Date: 02/06/23 Patient's Name and Date of Birth confirmed.  Transition Care Management Follow-up Telephone Call Date of Discharge: 02/04/23 Discharge Facility: [facility]) Type of Discharge: Emergency Department Reason for ED Visit: Other: How have you been since you were released from the hospital?: Better Any questions or concerns?: No  Items Reviewed: Did you receive and understand the discharge instructions provided?: Yes Medications obtained,verified, and reconciled?: Yes (Medications Reviewed) Any new allergies since your discharge?: No Dietary orders reviewed?: NA Do you have support at home?: Yes People in Home: spouse  Medications Reviewed Today: Medications Reviewed Today     Reviewed by Leigh Aurora, CMA (Certified Medical Assistant) on 02/06/23 at 1545  Med List Status: <None>   Medication Order Taking? Sig Documenting Provider Last Dose Status Informant  DULoxetine (CYMBALTA) 60 MG capsule [id number]  Take 1 capsule (60 mg total) by mouth daily. Etta Grandchild, MD  Active   EPINEPHrine (EPIPEN 2-PAK) 0.3 mg/0.3 mL IJ SOAJ injection [id number] No Inject 0.3 mg into the muscle as needed for anaphylaxis. [name], MD Taking Active   rosuvastatin (CRESTOR) 10 MG tablet [id number]  Take 1 tablet (10 mg total) by mouth daily. Etta Grandchild, MD  Active             Home Care and Equipment/Supplies: Were Home Health Services Ordered?: NA Any new equipment or medical supplies ordered?: NA  Functional Questionnaire: Do you need assistance with bathing/showering or dressing?: No Do you need assistance with meal preparation?: No Do you need assistance with eating?: No Do you have difficulty maintaining continence: No Do you need assistance with getting out of bed/getting  out of a chair/moving?: No Do you have difficulty managing or taking your medications?: No  Follow up appointments reviewed: PCP Follow-up appointment confirmed?: No (wife declined follow up at this time, states pt will come in for regular follow up in Feb with Dr. Yetta Barre) Specialist [facility] Follow-up appointment confirmed?: NA Do you need transportation to your follow-up appointment?: No Do you understand care options if your condition(s) worsen?: Yes-patient verbalized understanding    SIGNATURE Agnes Lawrence, CMA (AAMA)  [facility] [phone]

## 2023-02-20 ENCOUNTER — Ambulatory Visit: Payer: Self-pay | Admitting: Internal Medicine

## 2023-02-20 NOTE — Telephone Encounter (Signed)
 Copied from CRM [id number]. Topic: Clinical - Red Word Triage >> Feb 20, 2023  8:56 AM Howard Macho wrote: Red Word that prompted transfer to Nurse Triage:patient is acting confused and sometimes does not know his spouse   Chief Complaint: confusion Symptoms: forgetful Frequency: ongoing for 3 weeks  Disposition: [x] ED /[] Urgent Care (no appt availability in office) / [] Appointment(In office/virtual)/ []  [place] [facility]/ [] Home Care/ [] Refused Recommended Disposition /[]  Mobile Bus/ []  Follow-up with PCP Additional Notes: The patient's wife reported that 3 weeks ago, the patient suddenly became confused and asked to call 911.  Since then he has not been acting like himself.  He hides his keys and is unable to find them for half a day, he says he's going to work and he retired 6 years ago, he goes to bed at 4-5 pm in the afternoon and wants his wife to go to bed too because he is scared.  She stated that he has been drinking less than when he was seen at the hospital.  The patient was becoming agitated in the background asking his wife why she is talking about him.  He said he wanted to speak to the nurse and the nurse introduced herself and asked if he has felt confused and he gave the phone back to his wife and said thank you.  Advised the patient's wife to call 911 for further evaluation.  She said she thinks he needs to go too and said she would talk to him.  He was becoming more agitated by her talking to the nurse so she said she would disconnect the call and talk to him about calling 911.  She said she thinks he will be agreeable.  She reported that his dad had dementia but she doesn't think that is what is happening.  Routed to pcp for awareness.   Reason for Disposition  [1] Difficult to awaken or acting confused (e.g., disoriented, slurred speech) AND [2] present now AND [3] new-onset  Answer Assessment - Initial Assessment Questions 1. LEVEL OF CONSCIOUSNESS: "How is he (she,  the patient) acting right now?" (e.g., alert-oriented, confused, lethargic, stuporous, comatose)     Confusion  2. ONSET: "When did the confusion start?"  (minutes, hours, days)     3 weeks ago called 911 3. PATTERN "Does this come and go, or has it been constant since it started?"  "Is it present now?"     Constant   6. CAUSE: "What do you think is causing the confusion?"      Unsure  7. OTHER SYMPTOMS: "Are there any other symptoms?" (e.g., difficulty breathing, headache, fever, weakness)     No abdominal pain  Protocols used: Confusion - Delirium-A-AH

## 2023-03-06 ENCOUNTER — Ambulatory Visit (INDEPENDENT_AMBULATORY_CARE_PROVIDER_SITE_OTHER): Payer: Medicare Other | Admitting: Internal Medicine

## 2023-03-06 DIAGNOSIS — D696 Thrombocytopenia, unspecified: Secondary | ICD-10-CM

## 2023-03-06 NOTE — Progress Notes (Unsigned)
   Subjective:  Patient ID: Keith Cobb, male    DOB: 11/23/1950  Age: 73 y.o. MRN: [id number]  CC: No chief complaint on file.   HPI Keith Cobb presents for ***  Outpatient Medications Prior to Visit  Medication Sig Dispense Refill   DULoxetine (CYMBALTA) 60 MG capsule Take 1 capsule (60 mg total) by mouth daily. 90 capsule 0   EPINEPHrine (EPIPEN 2-PAK) 0.3 mg/0.3 mL IJ SOAJ injection Inject 0.3 mg into the muscle as needed for anaphylaxis. 1 each 0   rosuvastatin (CRESTOR) 10 MG tablet Take 1 tablet (10 mg total) by mouth daily. 90 tablet 0   No facility-administered medications prior to visit.    ROS Review of Systems  Objective:  There were no vitals taken for this visit.  BP Readings from Last 3 Encounters:  02/04/23 (!) 129/102  10/26/22 106/64  03/23/22 126/78    Wt Readings from Last 3 Encounters:  02/04/23 205 lb 0.4 oz (93 kg)  10/26/22 205 lb (93 kg)  03/23/22 206 lb (93.4 kg)    Physical Exam  Lab Results  Component Value Date   WBC 4.5 02/04/2023   HGB 13.8 02/04/2023   HCT 41.5 02/04/2023   PLT 139 (L) 02/04/2023   GLUCOSE 90 02/04/2023   CHOL 136 12/22/2021   TRIG 85.0 12/22/2021   HDL 61.30 12/22/2021   LDLCALC 58 12/22/2021   ALT 22 02/04/2023   AST 24 02/04/2023   NA 135 02/04/2023   K 3.6 02/04/2023   CL 100 02/04/2023   CREATININE 0.59 (L) 02/04/2023   BUN 13 02/04/2023   CO2 22 02/04/2023   TSH 1.23 10/26/2022   PSA 1.42 12/22/2021   HGBA1C 5.6 10/03/2019    No results found.  Assessment & Plan:  There are no diagnoses linked to this encounter.   Follow-up: No follow-ups on file.  Sanda Linger, MD

## 2023-03-07 ENCOUNTER — Other Ambulatory Visit: Payer: Self-pay | Admitting: Internal Medicine

## 2023-03-07 DIAGNOSIS — E785 Hyperlipidemia, unspecified: Secondary | ICD-10-CM

## 2023-03-13 ENCOUNTER — Encounter: Payer: Self-pay | Admitting: Internal Medicine

## 2023-03-23 ENCOUNTER — Other Ambulatory Visit: Payer: Self-pay | Admitting: Internal Medicine

## 2023-03-23 DIAGNOSIS — F331 Major depressive disorder, recurrent, moderate: Secondary | ICD-10-CM

## 2023-03-23 DIAGNOSIS — E785 Hyperlipidemia, unspecified: Secondary | ICD-10-CM

## 2023-03-23 DIAGNOSIS — M1712 Unilateral primary osteoarthritis, left knee: Secondary | ICD-10-CM

## 2023-04-06 ENCOUNTER — Encounter: Payer: Self-pay | Admitting: Internal Medicine

## 2023-05-04 ENCOUNTER — Ambulatory Visit: Admitting: Internal Medicine

## 2023-05-04 ENCOUNTER — Encounter: Payer: Self-pay | Admitting: Internal Medicine

## 2023-05-04 ENCOUNTER — Ambulatory Visit (INDEPENDENT_AMBULATORY_CARE_PROVIDER_SITE_OTHER)

## 2023-05-04 VITALS — BP 130/78 | HR 74 | Temp 99.2°F | Ht 71.0 in | Wt 189.8 lb

## 2023-05-04 DIAGNOSIS — I10 Essential (primary) hypertension: Secondary | ICD-10-CM | POA: Diagnosis not present

## 2023-05-04 DIAGNOSIS — E785 Hyperlipidemia, unspecified: Secondary | ICD-10-CM

## 2023-05-04 DIAGNOSIS — F02B11 Dementia in other diseases classified elsewhere, moderate, with agitation: Secondary | ICD-10-CM

## 2023-05-04 DIAGNOSIS — D696 Thrombocytopenia, unspecified: Secondary | ICD-10-CM

## 2023-05-04 DIAGNOSIS — S7711XA Crushing injury of right thigh, initial encounter: Secondary | ICD-10-CM | POA: Diagnosis not present

## 2023-05-04 DIAGNOSIS — G301 Alzheimer's disease with late onset: Secondary | ICD-10-CM

## 2023-05-04 DIAGNOSIS — R233 Spontaneous ecchymoses: Secondary | ICD-10-CM

## 2023-05-04 DIAGNOSIS — R4189 Other symptoms and signs involving cognitive functions and awareness: Secondary | ICD-10-CM

## 2023-05-04 DIAGNOSIS — R270 Ataxia, unspecified: Secondary | ICD-10-CM | POA: Diagnosis not present

## 2023-05-04 DIAGNOSIS — N400 Enlarged prostate without lower urinary tract symptoms: Secondary | ICD-10-CM

## 2023-05-04 DIAGNOSIS — Z0000 Encounter for general adult medical examination without abnormal findings: Secondary | ICD-10-CM | POA: Diagnosis not present

## 2023-05-04 DIAGNOSIS — Z0001 Encounter for general adult medical examination with abnormal findings: Secondary | ICD-10-CM

## 2023-05-04 DIAGNOSIS — Z043 Encounter for examination and observation following other accident: Secondary | ICD-10-CM | POA: Diagnosis not present

## 2023-05-04 LAB — PSA: PSA: 1.49 ng/mL (ref 0.10–4.00)

## 2023-05-04 LAB — LIPID PANEL
Cholesterol: 104 mg/dL (ref 0–200)
HDL: 65.8 mg/dL (ref >39.00)
LDL Cholesterol: 27 mg/dL (ref 0–99)
NonHDL: 37.71
Total CHOL/HDL Ratio: 2
Triglycerides: 56 mg/dL (ref 0.0–149.0)
VLDL: 11.2 mg/dL (ref 0.0–40.0)

## 2023-05-04 LAB — APTT: aPTT: 29.9 s (ref 25.4–36.8)

## 2023-05-04 LAB — FOLATE: Folate: 13.2 ng/mL (ref >5.9)

## 2023-05-04 LAB — PROTIME-INR
INR: 1 ratio (ref 0.8–1.0)
Prothrombin Time: 10.5 s (ref 9.6–13.1)

## 2023-05-04 LAB — VITAMIN B12: Vitamin B-12: 916 pg/mL — ABNORMAL HIGH (ref 211–911)

## 2023-05-04 LAB — CBC WITH DIFFERENTIAL/PLATELET
Basophils Absolute: 0 10*3/uL (ref 0.0–0.1)
Basophils Relative: 0.3 % (ref 0.0–3.0)
Eosinophils Absolute: 0.1 10*3/uL (ref 0.0–0.7)
Eosinophils Relative: 1.9 % (ref 0.0–5.0)
HCT: 39.3 % (ref 39.0–52.0)
Hemoglobin: 12.9 g/dL — ABNORMAL LOW (ref 13.0–17.0)
Lymphocytes Relative: 20.8 % (ref 12.0–46.0)
Lymphs Abs: 0.9 10*3/uL (ref 0.7–4.0)
MCHC: 32.9 g/dL (ref 30.0–36.0)
MCV: 87 fl (ref 78.0–100.0)
Monocytes Absolute: 0.6 10*3/uL (ref 0.1–1.0)
Monocytes Relative: 14.3 % — ABNORMAL HIGH (ref 3.0–12.0)
Neutro Abs: 2.7 10*3/uL (ref 1.4–7.7)
Neutrophils Relative %: 62.7 % (ref 43.0–77.0)
Platelets: 116 10*3/uL — ABNORMAL LOW (ref 150.0–400.0)
RBC: 4.52 Mil/uL (ref 4.22–5.81)
RDW: 15.2 % (ref 11.5–15.5)
WBC: 4.3 10*3/uL (ref 4.0–10.5)

## 2023-05-04 MED ORDER — BREXPIPRAZOLE 0.25 MG PO TABS: 0.2500 mg | ORAL_TABLET | Freq: Every day | ORAL | 0 refills | Status: DC

## 2023-05-04 NOTE — Patient Instructions (Signed)
 Health Maintenance, Male  Adopting a healthy lifestyle and getting preventive care are important in promoting health and wellness. Ask your health care provider about:  The right schedule for you to have regular tests and exams.  Things you can do on your own to prevent diseases and keep yourself healthy.  What should I know about diet, weight, and exercise?  Eat a healthy diet    Eat a diet that includes plenty of vegetables, fruits, low-fat dairy products, and lean protein.  Do not eat a lot of foods that are high in solid fats, added sugars, or sodium.  Maintain a healthy weight  Body mass index (BMI) is a measurement that can be used to identify possible weight problems. It estimates body fat based on height and weight. Your health care provider can help determine your BMI and help you achieve or maintain a healthy weight.  Get regular exercise  Get regular exercise. This is one of the most important things you can do for your health. Most adults should:  Exercise for at least 150 minutes each week. The exercise should increase your heart rate and make you sweat (moderate-intensity exercise).  Do strengthening exercises at least twice a week. This is in addition to the moderate-intensity exercise.  Spend less time sitting. Even light physical activity can be beneficial.  Watch cholesterol and blood lipids  Have your blood tested for lipids and cholesterol at 73 years of age, then have this test every 5 years.  You may need to have your cholesterol levels checked more often if:  Your lipid or cholesterol levels are high.  You are older than 73 years of age.  You are at high risk for heart disease.  What should I know about cancer screening?  Many types of cancers can be detected early and may often be prevented. Depending on your health history and family history, you may need to have cancer screening at various ages. This may include screening for:  Colorectal cancer.  Prostate cancer.  Skin cancer.  Lung  cancer.  What should I know about heart disease, diabetes, and high blood pressure?  Blood pressure and heart disease  High blood pressure causes heart disease and increases the risk of stroke. This is more likely to develop in people who have high blood pressure readings or are overweight.  Talk with your health care provider about your target blood pressure readings.  Have your blood pressure checked:  Every 3-5 years if you are 9-95 years of age.  Every year if you are 85 years old or older.  If you are between the ages of 29 and 29 and are a current or former smoker, ask your health care provider if you should have a one-time screening for abdominal aortic aneurysm (AAA).  Diabetes  Have regular diabetes screenings. This checks your fasting blood sugar level. Have the screening done:  Once every three years after age 23 if you are at a normal weight and have a low risk for diabetes.  More often and at a younger age if you are overweight or have a high risk for diabetes.  What should I know about preventing infection?  Hepatitis B  If you have a higher risk for hepatitis B, you should be screened for this virus. Talk with your health care provider to find out if you are at risk for hepatitis B infection.  Hepatitis C  Blood testing is recommended for:  Everyone born from [birth year] through 1965.  Anyone  with known risk factors for hepatitis C.  Sexually transmitted infections (STIs)  You should be screened each year for STIs, including gonorrhea and chlamydia, if:  You are sexually active and are younger than 73 years of age.  You are older than 73 years of age and your health care provider tells you that you are at risk for this type of infection.  Your sexual activity has changed since you were last screened, and you are at increased risk for chlamydia or gonorrhea. Ask your health care provider if you are at risk.  Ask your health care provider about whether you are at high risk for HIV. Your health care provider  may recommend a prescription medicine to help prevent HIV infection. If you choose to take medicine to prevent HIV, you should first get tested for HIV. You should then be tested every 3 months for as long as you are taking the medicine.  Follow these instructions at home:  Alcohol use  Do not drink alcohol if your health care provider tells you not to drink.  If you drink alcohol:  Limit how much you have to 0-2 drinks a day.  Know how much alcohol is in your drink. In the [place] one drink equals one 12 oz bottle of beer (355 mL), one 5 oz glass of wine (148 mL), or one 1 oz glass of hard liquor (44 mL).  Lifestyle  Do not use any products that contain nicotine or tobacco. These products include cigarettes, chewing tobacco, and vaping devices, such as e-cigarettes. If you need help quitting, ask your health care provider.  Do not use street drugs.  Do not share needles.  Ask your health care provider for help if you need support or information about quitting drugs.  General instructions  Schedule regular health, dental, and eye exams.  Stay current with your vaccines.  Tell your health care provider if:  You often feel depressed.  You have ever been abused or do not feel safe at home.  Summary  Adopting a healthy lifestyle and getting preventive care are important in promoting health and wellness.  Follow your health care provider's instructions about healthy diet, exercising, and getting tested or screened for diseases.  Follow your health care provider's instructions on monitoring your cholesterol and blood pressure.  This information is not intended to replace advice given to you by your health care provider. Make sure you discuss any questions you have with your health care provider.  Document Revised: 05/18/2020 Document Reviewed: 05/18/2020  Elsevier Patient Education  2024 [employer].

## 2023-05-04 NOTE — Progress Notes (Signed)
 Subjective:  Patient ID: Keith Cobb, male    DOB: 17-Dec-1950  Age: 73 y.o. MRN: [id number]  CC: Memory Loss (Patient wife states that she's concerned about his memory issues. He is very aggressive and hateful at times but he never becomes physical. He barely remembers who she is and they've been together for 36 years. Sometimes he thinks she's a different person. He also hides things from hisself and doesn't remember until the next morning if that. All of this started back in January when he randomly call  911 and told his wife "I need them" but she couldn't see anything wrong) and Annual Exam   HPI Keith Cobb presents for a CPX and f/up ----  Discussed the use of AI scribe software for clinical note transcription with the patient, who gave verbal consent to proceed.  History of Present Illness   Keith Cobb "[name]" is a 73 year old male who presents with bruising on his arms.  He has been experiencing bruising on his arms, which he attributes to falls and minor trauma. The bruises are resolving with the application of medication. Last week, he tripped while cutting his yard, resulting in a fall. No chest pain or shortness of breath during physical activities such as mowing the lawn.  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 He occasionally drinks one or two beers and smokes a cigarette when he does so, but he is trying to quit smoking as well.  He experiences headaches and some agitation, particularly when he feels accused of drinking. He also mentions hearing a constant noise, similar to an air conditioner, which he finds bothersome. No tinnitus reported.       Outpatient Medications Prior to Visit  Medication Sig Dispense Refill   DULoxetine  (CYMBALTA ) 60 MG capsule TAKE 1 CAPSULE BY MOUTH DAILY 90 capsule 3   EPINEPHrine  (EPIPEN  2-PAK) 0.3 mg/0.3 mL IJ SOAJ injection Inject 0.3  mg into the muscle as needed for anaphylaxis. 1 each 0   rosuvastatin  (CRESTOR ) 10 MG tablet TAKE 1 TABLET BY MOUTH DAILY 30 tablet 0   No facility-administered medications prior to visit.    ROS Review of Systems  Constitutional:  Positive for unexpected weight change (wt loss). Negative for appetite change, chills, diaphoresis and fatigue.  HENT: Negative.    Respiratory: Negative.  Negative for cough, choking, shortness of breath and wheezing.   Cardiovascular:  Negative for palpitations and leg swelling.  Gastrointestinal: Negative.  Negative for abdominal pain, blood in stool, constipation, diarrhea, nausea and vomiting.  Genitourinary: Negative.  Negative for difficulty urinating, dysuria and hematuria.  Musculoskeletal:  Positive for gait problem.       He fell a week ago and injured his right thigh  Skin: Negative.   Neurological:  Positive for numbness and headaches. Negative for dizziness and weakness.  Hematological:  Negative for adenopathy. Bruises/bleeds easily.  Psychiatric/Behavioral:  Positive for agitation, behavioral problems, confusion, decreased concentration and hallucinations. Negative for dysphoric mood, self-injury, sleep disturbance and suicidal ideas. The patient is not nervous/anxious.     Objective:  BP 130/78 (BP Location: Left Arm, Patient Position: Sitting, Cuff Size: Normal)   Pulse 74   Temp 99.2 F (37.3 C) (Temporal)   Ht 5\' 11"  (1.803 m)   Wt 189 lb 12.8 oz (86.1 kg)   SpO2 98%   BMI 26.47  kg/m   BP Readings from Last 3 Encounters:  05/04/23 130/78  02/04/23 (!) 129/102  10/26/22 106/64    Wt Readings from Last 3 Encounters:  05/04/23 189 lb 12.8 oz (86.1 kg)  02/04/23 205 lb 0.4 oz (93 kg)  10/26/22 205 lb (93 kg)    Physical Exam Vitals reviewed. Exam conducted with a chaperone present (his wife).  Constitutional:      Appearance: He is ill-appearing.  HENT:     Mouth/Throat:     Mouth: Mucous membranes are moist.  Eyes:      General: No scleral icterus.    Conjunctiva/sclera: Conjunctivae normal.  Cardiovascular:     Rate and Rhythm: Normal rate and regular rhythm.     Heart sounds: No murmur heard.    No friction rub. No gallop.  Pulmonary:     Effort: Pulmonary effort is normal.     Breath sounds: No stridor. No wheezing, rhonchi or rales.  Abdominal:     General: Abdomen is flat.     Palpations: There is no mass.     Tenderness: There is no abdominal tenderness. There is no guarding.     Hernia: No hernia is present. There is no hernia in the left inguinal area or right inguinal area.  Genitourinary:    Pubic Area: No rash.      Penis: Normal and circumcised.      Testes: Normal.     Epididymis:     Right: Normal.     Left: Normal.     Prostate: Enlarged. Not tender and no nodules present.     Rectum: Guaiac result negative. External hemorrhoid present. No mass, tenderness, anal fissure or internal hemorrhoid. Normal anal tone.  Musculoskeletal:        General: Normal range of motion.     Cervical back: Neck supple.     Right lower leg: No edema.     Left lower leg: No edema.  Lymphadenopathy:     Cervical: No cervical adenopathy.     Lower Body: No right inguinal adenopathy. No left inguinal adenopathy.  Skin:    General: Skin is warm and dry.     Findings: Bruising present. No rash.  Neurological:     General: No focal deficit present.     Mental Status: He is alert. Mental status is at baseline.  Psychiatric:        Mood and Affect: Mood normal.        Behavior: Behavior normal.     Lab Results  Component Value Date   WBC 4.3 05/04/2023   HGB 12.9 (L) 05/04/2023   HCT 39.3 05/04/2023   PLT 116.0 (L) 05/04/2023   GLUCOSE 90 02/04/2023   CHOL 104 05/04/2023   TRIG 56.0 05/04/2023   HDL 65.80 05/04/2023   LDLCALC 27 05/04/2023   ALT 22 02/04/2023   AST 24 02/04/2023   NA 135 02/04/2023   K 3.6 02/04/2023   CL 100 02/04/2023   CREATININE 0.59 (L) 02/04/2023   BUN 13  02/04/2023   CO2 22 02/04/2023   TSH 1.23 10/26/2022   PSA 1.49 05/04/2023   INR 1.0 05/04/2023   HGBA1C 5.6 10/03/2019    DG FEMUR, MIN 2 VIEWS RIGHT Result Date: 05/04/2023 CLINICAL DATA:  Status post fall EXAM: RIGHT FEMUR 2 VIEWS COMPARISON:  None Available. FINDINGS: There is no evidence of fracture or other focal bone lesions. Soft tissues are unremarkable. IMPRESSION: Negative. Electronically Signed   By: Fredrich Jefferson  M.D.   On: 05/04/2023 13:17       Assessment & Plan:  Ataxia- Will evaluate with an MRI. -     Vitamin B1; Future -     Vitamin B12; Future -     Folate; Future -     MR BRAIN WO CONTRAST; Future  Cognitive impairment -     Vitamin B1; Future -     Vitamin B12; Future -     Folate; Future -     MR BRAIN WO CONTRAST; Future  Primary hypertension- His BP is well controlled.  Dyslipidemia, goal LDL below 130- LDL goal achieved. Doing well on the statin  -     Lipid panel; Future  Thrombocytopenia (HCC) -     CBC with Differential/Platelet; Future -     Vitamin B1; Future -     Vitamin B12; Future -     Folate; Future  Bruising, spontaneous -     Protime-INR; Future -     APTT; Future  Encounter for general adult medical examination with abnormal findings- Exam completed, labs reviewed, vaccines reviewed and updated, cancer screenings addressed, pt ed material was given.   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  Dispense: 30 tablet; Refill: 0     Follow-up: Return in about 3 months (around 08/03/2023).  Sandra Crouch, MD

## 2023-05-08 ENCOUNTER — Encounter: Payer: Self-pay | Admitting: Internal Medicine

## 2023-05-08 LAB — VITAMIN B1: Vitamin B1 (Thiamine): 12 nmol/L (ref 8–30)

## 2023-05-08 MED ORDER — ROSUVASTATIN CALCIUM 10 MG PO TABS: 10.0000 mg | ORAL_TABLET | Freq: Every day | ORAL | 0 refills | Status: DC

## 2023-05-20 ENCOUNTER — Ambulatory Visit
Admission: RE | Admit: 2023-05-20 | Discharge: 2023-05-20 | Disposition: A | Discharge status: Home / Self Care | Source: Ambulatory Visit | Attending: Internal Medicine | Admitting: Internal Medicine

## 2023-05-20 DIAGNOSIS — R270 Ataxia, unspecified: Secondary | ICD-10-CM | POA: Diagnosis not present

## 2023-05-20 DIAGNOSIS — R4189 Other symptoms and signs involving cognitive functions and awareness: Secondary | ICD-10-CM

## 2023-05-23 ENCOUNTER — Ambulatory Visit: Payer: Self-pay | Admitting: Internal Medicine

## 2023-05-24 ENCOUNTER — Other Ambulatory Visit: Payer: Self-pay | Admitting: Internal Medicine

## 2023-05-24 ENCOUNTER — Encounter: Payer: Self-pay | Admitting: Internal Medicine

## 2023-05-24 ENCOUNTER — Ambulatory Visit: Payer: Self-pay

## 2023-05-24 DIAGNOSIS — G301 Alzheimer's disease with late onset: Secondary | ICD-10-CM

## 2023-05-24 DIAGNOSIS — F1027 Alcohol dependence with alcohol-induced persisting dementia: Secondary | ICD-10-CM

## 2023-05-24 NOTE — Telephone Encounter (Signed)
 See MyChart message, referral requested.  Copied from CRM [id number]. Topic: General - Call Back - No Documentation >> May 24, 2023 10:43 AM Shereese L wrote: Reason for CRM: patient wife is requesting a call back in reference to the referral for the neurologist and needs a call back in reference to the message left on my chart Reason for Disposition . Nursing judgment  Protocols used: No Guideline or Reference Available-A-AH

## 2023-05-24 NOTE — Telephone Encounter (Signed)
 Patient wife states she thinks Her husband has Dementia. He has a habit of hiding his wallet keys and cell phone from hisself. He also tells her that he is very anxious because he thinks somebody is going to come an [place] him. He also tells her once a day that she's not his wife and they have been together for about 37 years. He also is very forgetful of the things he has and constantly telling her he has nothing. Could you please place a urgent referral?

## 2023-05-26 ENCOUNTER — Other Ambulatory Visit: Payer: Self-pay | Admitting: Internal Medicine

## 2023-06-02 ENCOUNTER — Encounter: Payer: Self-pay | Admitting: Physician Assistant

## 2023-06-16 ENCOUNTER — Ambulatory Visit: Payer: Self-pay | Admitting: Internal Medicine

## 2023-06-16 ENCOUNTER — Ambulatory Visit (INDEPENDENT_AMBULATORY_CARE_PROVIDER_SITE_OTHER): Admitting: Internal Medicine

## 2023-06-16 ENCOUNTER — Encounter: Payer: Self-pay | Admitting: Internal Medicine

## 2023-06-16 ENCOUNTER — Ambulatory Visit (INDEPENDENT_AMBULATORY_CARE_PROVIDER_SITE_OTHER)

## 2023-06-16 VITALS — BP 122/76 | HR 61 | Temp 97.8°F | Ht 71.0 in | Wt 188.0 lb

## 2023-06-16 DIAGNOSIS — M4852XG Collapsed vertebra, not elsewhere classified, cervical region, subsequent encounter for fracture with delayed healing: Secondary | ICD-10-CM | POA: Insufficient documentation

## 2023-06-16 DIAGNOSIS — M542 Cervicalgia: Secondary | ICD-10-CM | POA: Diagnosis not present

## 2023-06-16 DIAGNOSIS — M5030 Other cervical disc degeneration, unspecified cervical region: Secondary | ICD-10-CM | POA: Diagnosis not present

## 2023-06-16 DIAGNOSIS — H9190 Unspecified hearing loss, unspecified ear: Secondary | ICD-10-CM | POA: Insufficient documentation

## 2023-06-16 DIAGNOSIS — M4852XA Collapsed vertebra, not elsewhere classified, cervical region, initial encounter for fracture: Secondary | ICD-10-CM | POA: Diagnosis not present

## 2023-06-16 DIAGNOSIS — D696 Thrombocytopenia, unspecified: Secondary | ICD-10-CM | POA: Diagnosis not present

## 2023-06-16 DIAGNOSIS — F1027 Alcohol dependence with alcohol-induced persisting dementia: Secondary | ICD-10-CM

## 2023-06-16 DIAGNOSIS — M4802 Spinal stenosis, cervical region: Secondary | ICD-10-CM | POA: Diagnosis not present

## 2023-06-16 NOTE — Patient Instructions (Signed)
 Thrombocytopenia Thrombocytopenia is a condition in which there are a low number of platelets in the blood. Platelets are also called thrombocytes. Platelets are parts of blood that stick together and form a clot to help the body stop bleeding after an injury. If you have too few platelets, your blood may have trouble clotting. This may cause you to bleed and bruise very easily. Some cases of thrombocytopenia are mild while others are more severe. What are the causes? This condition is caused by a low number of platelets in your blood. There are three main reasons for this: Your body not making enough platelets. This may be caused by: Bone marrow diseases. This include aplastic anemia, leukemia, and myelodysplastic anemia. Congenital thrombocytopenia. This is a condition that is passed from parent to child (inherited). Certain cancer treatments, including chemotherapy and radiation therapy. Infections from bacteria or viruses. Alcohol use disorder and alcoholism. Platelets not being released in the blood. This is called platelet sequestration and it can happen due to: An overactive spleen (hypersplenism). The spleen gathers up platelets from circulation, meaning that the platelets are not available to help with clotting your blood. The spleen can be enlarged because of scarring or other conditions. Gaucher disease. Your body destroying platelets too quickly. This may be caused by: An autoimmune disease that causes immune thrombocytopenia (ITP). ITP is sometimes associated with other autoimmune conditions such as lupus. Certain medicines, such as blood thinners. Certain blood clotting or bleeding disorders. Exposure to toxic chemicals, such as pesticides, lead, benzene, and arsenic. Pregnancy. What are the signs or symptoms? Symptoms of this condition are the result of poor blood clotting. They will vary depending on how low the platelet counts are. Symptoms may include: Bruising  easily. Bleeding from the mouth or nose. Heavy menstrual periods. Blood in the urine, stool (feces), or vomit. Purplish-red discolorations on the skin (purpura). A rash that looks like pinpoint, purplish-red spots (petechiae) on the lower legs. How is this diagnosed?  This condition may be diagnosed with blood tests and a physical exam. You may also have other tests, including: A sample of bone marrow (biopsy) may be removed to look for the original cells that make platelets. An ultrasound or CT scan of the abdomen to check for an enlarged spleen, enlarged lymph nodes, or liver problems. How is this treated? Treatment for this condition depends on the cause. Treatment may include: Treatment of another condition that is causing the low platelet count. Medicines to help protect your platelets from being destroyed. A replacement (transfusion) of platelets to stop or prevent bleeding. Surgery to remove the spleen. Follow these instructions at home: Medicines Take over-the-counter and prescription medicines only as told by your health care provider. Do not take any medicines that contain aspirin or NSAIDs, such as ibuprofen. These medicines increase your risk for dangerous bleeding. Activity Avoid activities that could cause injury or bruising, and follow instructions about how to prevent falls. Do not play contact sports. Ask your health care provider what activities are safe for you. Take extra care to protect yourself from burns when ironing or cooking. Take extra care not to cut yourself when you shave or when you use scissors, needles, knives, and other tools. General instructions  Check your skin and the inside of your mouth for bruising or bleeding as told by your health care provider. Wear a medical alert bracelet that says that you have a bleeding disorder. This can help you get the treatment you need in case of emergency. Check  your urine and stool for blood as told by your health  care provider. Do not drink alcohol. If you do drink alcohol, limit the amount that you drink. Minimize contact with toxic chemicals. Tell all your health care providers, including dental care providers and eye doctors, about your condition. Make sure to tell dental care providers before you have any procedure done, including dental cleanings. Keep all follow-up visits. This is important. Contact a health care provider if: You have unexplained bruising. You have new symptoms. You have symptoms that get worse. You have a fever. Get help right away if: You have severe bleeding from anywhere on your body. You have blood in your vomit, urine, or stool. You have an injury to your head. You have a sudden, severe headache. Summary Thrombocytopenia is a condition in which you have a low number of platelets in the blood. Platelets are parts of blood that stick together to form a clot. Symptoms of this condition are the result of poor blood clotting and may include bruising easily, bleeding from the nose or mouth, petechiae, and purpura. This condition may be diagnosed with blood tests and a physical exam. Treatment for this condition depends on the cause. This information is not intended to replace advice given to you by your health care provider. Make sure you discuss any questions you have with your health care provider. Document Revised: 06/11/2020 Document Reviewed: 06/11/2020 Elsevier Patient Education  2024 [employer].

## 2023-06-16 NOTE — Progress Notes (Signed)
 Subjective:  Patient ID: Keith Cobb, male    DOB: 28-May-1950  Age: 73 y.o. MRN: [id number]  CC: Numbness (Tingling in his fingertips. ) and Mass (On his right wrist at the top )   HPI Keith Cobb presents for f/up ----  Discussed the use of AI scribe software for clinical note transcription with the patient, who gave verbal consent to proceed.  History of Present Illness   Keith WINT "[name]" is a 73 year old male who presents with hearing difficulties and tingling in his knees.  He has difficulty with hearing, noting that his hearing aids are not currently in use as one is not functioning properly. He keeps the hearing aids in his pocket rather than in his ears.  He describes experiencing tingling sensations in his knees. Additionally, he notes that his fingertips appear 'puffed' and 'damaged', though he is uncertain about the cause. No numbness, weakness, or tingling in other parts of his body. He has chronic neck pain.       Outpatient Medications Prior to Visit  Medication Sig Dispense Refill   DULoxetine  (CYMBALTA ) 60 MG capsule TAKE 1 CAPSULE BY MOUTH DAILY 90 capsule 3   EPINEPHrine  (EPIPEN  2-PAK) 0.3 mg/0.3 mL IJ SOAJ injection Inject 0.3 mg into the muscle as needed for anaphylaxis. 1 each 0   rosuvastatin  (CRESTOR ) 10 MG tablet Take 1 tablet (10 mg total) by mouth daily. 100 tablet 0   brexpiprazole  (REXULTI ) 0.25 MG TABS tablet Take 1 tablet (0.25 mg total) by mouth daily. 30 tablet 0   No facility-administered medications prior to visit.    ROS Review of Systems  Constitutional:  Negative for appetite change, chills, diaphoresis, fatigue and fever.  HENT: Negative.  Negative for nosebleeds.   Respiratory: Negative.  Negative for cough, chest tightness, shortness of breath and wheezing.   Cardiovascular:  Negative for chest pain, palpitations and leg swelling.  Gastrointestinal: Negative.  Negative for abdominal pain, constipation, diarrhea, nausea and  vomiting.  Genitourinary: Negative.  Negative for difficulty urinating and hematuria.  Musculoskeletal:  Positive for neck pain. Negative for arthralgias and myalgias.  Skin:  Positive for wound.  Neurological:  Positive for numbness. Negative for dizziness and weakness.  Hematological:  Negative for adenopathy. Bruises/bleeds easily.  Psychiatric/Behavioral:  Positive for behavioral problems, confusion and decreased concentration. Negative for agitation, dysphoric mood and suicidal ideas. The patient is not nervous/anxious.     Objective:  BP 122/76 (BP Location: Left Arm, Patient Position: Sitting, Cuff Size: Normal)   Pulse 61   Temp 97.8 F (36.6 C) (Oral)   Ht 5\' 11"  (1.803 m)   Wt 188 lb (85.3 kg)   SpO2 97%   BMI 26.22 kg/m   BP Readings from Last 3 Encounters:  06/16/23 122/76  05/04/23 130/78  02/04/23 (!) 129/102             Wt Readings from Last 3 Encounters:  06/16/23 188 lb (85.3 kg)  05/04/23 189 lb 12.8 oz (86.1 kg)  02/04/23 205 lb 0.4 oz (93 kg)    Physical Exam Vitals reviewed.  Constitutional:      Appearance: Normal appearance.  HENT:     Mouth/Throat:     Mouth: Mucous membranes are moist.  Eyes:     General: No scleral icterus.    Conjunctiva/sclera: Conjunctivae normal.  Cardiovascular:     Rate and Rhythm: Normal rate and regular rhythm.     Heart sounds: No murmur heard.  No friction rub. No gallop.  Pulmonary:     Effort: Pulmonary effort is normal.     Breath sounds: No stridor. No wheezing, rhonchi or rales.  Abdominal:     General: Abdomen is flat.     Palpations: There is no mass.     Tenderness: There is no abdominal tenderness. There is no guarding.     Hernia: No hernia is present.  Musculoskeletal:        General: Normal range of motion.     Cervical back: Neck supple.     Right lower leg: No edema.     Left lower leg: No edema.  Lymphadenopathy:     Cervical: No cervical adenopathy.  Skin:    General: Skin is  warm and dry.  Neurological:     General: No focal deficit present.     Mental Status: He is alert. Mental status is at baseline.     Sensory: Sensation is intact.     Motor: Motor function is intact.     Coordination: Coordination is intact.     Gait: Gait is intact.     Deep Tendon Reflexes: Reflexes normal.     Reflex Scores:      Tricep reflexes are 1+ on the right side and 1+ on the left side.      Bicep reflexes are 1+ on the right side and 1+ on the left side.      Brachioradialis reflexes are 1+ on the right side and 1+ on the left side.      Patellar reflexes are 2+ on the right side and 2+ on the left side.      Achilles reflexes are 0 on the right side and 0 on the left side. Psychiatric:        Mood and Affect: Mood normal.        Behavior: Behavior normal.     Lab Results  Component Value Date   WBC 4.3 05/04/2023   HGB 12.9 (L) 05/04/2023   HCT 39.3 05/04/2023   PLT 116.0 (L) 05/04/2023   GLUCOSE 90 02/04/2023   CHOL 104 05/04/2023   TRIG 56.0 05/04/2023   HDL 65.80 05/04/2023   LDLCALC 27 05/04/2023   ALT 22 02/04/2023   AST 24 02/04/2023   NA 135 02/04/2023   K 3.6 02/04/2023   CL 100 02/04/2023   CREATININE 0.59 (L) 02/04/2023   BUN 13 02/04/2023   CO2 22 02/04/2023   TSH 1.23 10/26/2022   PSA 1.49 05/04/2023   INR 1.0 05/04/2023   HGBA1C 5.6 10/03/2019    MR Brain Wo Contrast Result Date: 05/23/2023 EXAMINATION: MR BRAIN WO CONTRAST HISTORY: Ataxia, recurrent (Ped 0-17y) TECHNIQUE: MRI of the brain performed without IV contrast. COMPARISON: None FINDINGS: No evidence for intracranial mass, hemorrhage, or acute infarct. There is moderate diffuse cerebral volume loss. There are moderate bilateral periventricular and subcortical white matter T2 hyperintensities, which are nonspecific, but most likely secondary to chronic microvascular ischemia. The ventricles are symmetric and the basilar cisterns are patent. Major intracranial flow voids appear  preserved. The paranasal sinuses and mastoid air cells appear well aerated. The orbits are normal. IMPRESSION: No acute intracranial findings. Moderate diffuse cerebral volume loss with moderate chronic microvascular ischemic changes. Electronically signed by: [place] Engel MD 05/23/2023 07:20 AM EDT RP Workstation: [id number]   DG Cervical Spine Complete Result Date: 06/16/2023 CLINICAL DATA:  Cervical pain EXAM: CERVICAL SPINE - COMPLETE 4+ VIEW COMPARISON:  December 23, 2010 FINDINGS: Progression  of degenerative disc disease with significant narrowing of the C3-4 C4-5 disc space with mild collapse of the C 4 vertebral body in relation to prior examination. There is also degenerative disc disease with narrowing of the C5-C6 C6-C7 disc spaces there is significant posterior bony spondylosis C4-C5 C5-C6 with bilateral symmetric foraminal narrowing. On the AP view upper mouth 40 odontoid image there seems to be some lateral translation of the C1 lateral mass in relation to the left but this is probably secondary to rotation. IMPRESSION: *Progression of degenerative disc disease with significant narrowing of the C3-4 C4-5 disc space with mild collapse of the C4 vertebral body in relation to prior examination. *There is also degenerative disc disease with narrowing of the C5-C6 C6-C7 disc spaces there is significant posterior bony spondylosis C4-C5 C5-C6 with bilateral symmetric foraminal narrowing. Electronically Signed   By: Fredrich Jefferson M.D.   On: 06/16/2023 09:11     Assessment & Plan:  Neck pain, bilateral -     DG Cervical Spine Complete; Future  Thrombocytopenia (HCC)- Will monitor his platelets. -     CBC with Differential/Platelet; Future  DDD (degenerative disc disease), cervical- He is neurologically intact. -     Ambulatory referral to Neurosurgery  Cervical vertebral collapse with delayed healing -     Ambulatory referral to Neurosurgery  Foraminal stenosis of cervical region -      Ambulatory referral to Neurosurgery     Follow-up: Return in about 3 months (around 09/16/2023).  Sandra Crouch, MD

## 2023-06-29 ENCOUNTER — Encounter: Payer: Self-pay | Admitting: Physician Assistant

## 2023-06-29 ENCOUNTER — Ambulatory Visit: Admitting: Physician Assistant

## 2023-06-29 ENCOUNTER — Encounter

## 2023-06-29 VITALS — BP 150/70 | HR 76 | Resp 20 | Ht 71.0 in

## 2023-06-29 DIAGNOSIS — F03918 Unspecified dementia, unspecified severity, with other behavioral disturbance: Secondary | ICD-10-CM | POA: Insufficient documentation

## 2023-06-29 DIAGNOSIS — R413 Other amnesia: Secondary | ICD-10-CM | POA: Diagnosis not present

## 2023-06-29 NOTE — Patient Instructions (Signed)
 It was a pleasure to see you today at our office.   Recommendations:    Recommend visiting the website :  Dementia Success Path to better understand some behaviors related to memory loss. s     For assessment of decision of mental capacity and competency:  Call Dr. Laverne Potter, geriatric psychiatrist at [phone] Counseling regarding caregiver distress, including caregiver depression, anxiety and issues regarding community resources, adult day care programs, adult living facilities, or memory care questions:  please contact your  Primary Doctor's Social Worker  Whom to call: Memory  decline, memory medications: Call our office [phone]  For psychiatric meds, mood meds: Please have your primary care physician manage these medications.  If you have any severe symptoms of a stroke, or other severe issues such as confusion,severe chills or fever, etc call 911 or go to the ER as you may need to be evaluated further   [email or web address]   RECOMMENDATIONS FOR ALL PATIENTS WITH MEMORY PROBLEMS: 1. Continue to exercise (Recommend 30 minutes of walking everyday, or 3 hours every week) 2. Increase social interactions - continue going to [employer] and enjoy social gatherings with friends and family 3. Eat healthy, avoid fried foods and eat more fruits and vegetables 4. Maintain adequate blood pressure, blood sugar, and blood cholesterol level. Reducing the risk of stroke and cardiovascular disease also helps promoting better memory. 5. Avoid stressful situations. Live a simple life and avoid aggravations. Organize your time and prepare for the next day in anticipation. 6. Sleep well, avoid any interruptions of sleep and avoid any distractions in the bedroom that may interfere with adequate sleep quality 7. Avoid sugar, avoid sweets as there is a strong link between excessive sugar intake, diabetes, and cognitive impairment We discussed  the Mediterranean diet, which has been shown to help patients reduce the risk of progressive memory disorders and reduces cardiovascular risk. This includes eating fish, eat fruits and green leafy vegetables, nuts like almonds and hazelnuts, walnuts, and also use olive oil. Avoid fast foods and fried foods as much as possible. Avoid sweets and sugar as sugar use has been linked to worsening of memory function.  There is always a concern of gradual progression of memory problems. If this is the case, then we may need to adjust level of care according to patient needs. Support, both to the patient and caregiver, should then be put into place.    The Alzheimer's Association is here all day, every day for people facing Alzheimer's disease through our free 24/7 Helpline: 901-094-3847. The Helpline provides reliable information and support to all those who need assistance, such as individuals living with memory loss, Alzheimer's or other dementia, caregivers, health care professionals and the public.  Our highly trained and knowledgeable staff can help you with: Understanding memory loss, dementia and Alzheimer's  Medications and other treatment options  General information about aging and brain health  Skills to provide quality care and to find the best care from professionals  Legal, financial and living-arrangement decisions Our Helpline also features: Confidential care consultation provided by master's level clinicians who can help with decision-making support, crisis assistance and education on issues families face every day  Help in a caller's preferred language using our translation service that features more than 200 languages and dialects  Referrals to local community programs, services and ongoing support     FALL PRECAUTIONS: Be cautious when walking. Scan the area for obstacles that may increase the risk of trips and falls. When getting  up in the mornings, sit up at the edge of the bed for  a few minutes before getting out of bed. Consider elevating the bed at the head end to avoid drop of blood pressure when getting up. Walk always in a well-lit room (use night lights in the walls). Avoid area rugs or power cords from appliances in the middle of the walkways. Use a walker or a cane if necessary and consider physical therapy for balance exercise. Get your eyesight checked regularly.  FINANCIAL OVERSIGHT: Supervision, especially oversight when making financial decisions or transactions is also recommended.  HOME SAFETY: Consider the safety of the kitchen when operating appliances like stoves, microwave oven, and blender. Consider having supervision and share cooking responsibilities until no longer able to participate in those. Accidents with firearms and other hazards in the house should be identified and addressed as well.   ABILITY TO BE LEFT ALONE: If patient is unable to contact 911 operator, consider using LifeLine, or when the need is there, arrange for someone to stay with patients. Smoking is a fire hazard, consider supervision or cessation. Risk of wandering should be assessed by caregiver and if detected at any point, supervision and safe proof recommendations should be instituted.  MEDICATION SUPERVISION: Inability to self-administer medication needs to be constantly addressed. Implement a mechanism to ensure safe administration of the medications.         Mediterranean Diet A Mediterranean diet refers to food and lifestyle choices that are based on the traditions of countries located on the [employer]. This way of eating has been shown to help prevent certain conditions and improve outcomes for people who have chronic diseases, like kidney disease and heart disease. What are tips for following this plan? Lifestyle  Cook and eat meals together with your family, when possible. Drink enough fluid to keep your urine clear or pale yellow. Be physically active every  day. This includes: Aerobic exercise like running or swimming. Leisure activities like gardening, walking, or housework. Get 7-8 hours of sleep each night. If recommended by your health care provider, drink red wine in moderation. This means 1 glass a day for nonpregnant women and 2 glasses a day for men. A glass of wine equals 5 oz (150 mL). Reading food labels  Check the serving size of packaged foods. For foods such as rice and pasta, the serving size refers to the amount of cooked product, not dry. Check the total fat in packaged foods. Avoid foods that have saturated fat or trans fats. Check the ingredients list for added sugars, such as corn syrup. Shopping  At the grocery store, buy most of your food from the areas near the walls of the store. This includes: Fresh fruits and vegetables (produce). Grains, beans, nuts, and seeds. Some of these may be available in unpackaged forms or large amounts (in bulk). Fresh seafood. Poultry and eggs. Low-fat dairy products. Buy whole ingredients instead of prepackaged foods. Buy fresh fruits and vegetables in-season from local farmers markets. Buy frozen fruits and vegetables in resealable bags. If you do not have access to quality fresh seafood, buy precooked frozen shrimp or canned fish, such as tuna, salmon, or sardines. Buy small amounts of raw or cooked vegetables, salads, or olives from the deli or salad bar at your store. Stock your pantry so you always have certain foods on hand, such as olive oil, canned tuna, canned tomatoes, rice, pasta, and beans. Cooking  Cook foods with extra-virgin olive oil instead of using  butter or other vegetable oils. Have meat as a side dish, and have vegetables or grains as your main dish. This means having meat in small portions or adding small amounts of meat to foods like pasta or stew. Use beans or vegetables instead of meat in common dishes like chili or lasagna. Experiment with different cooking  methods. Try roasting or broiling vegetables instead of steaming or sauteing them. Add frozen vegetables to soups, stews, pasta, or rice. Add nuts or seeds for added healthy fat at each meal. You can add these to yogurt, salads, or vegetable dishes. Marinate fish or vegetables using olive oil, lemon juice, garlic, and fresh herbs. Meal planning  Plan to eat 1 vegetarian meal one day each week. Try to work up to 2 vegetarian meals, if possible. Eat seafood 2 or more times a week. Have healthy snacks readily available, such as: Vegetable sticks with hummus. Greek yogurt. Fruit and nut trail mix. Eat balanced meals throughout the week. This includes: Fruit: 2-3 servings a day Vegetables: 4-5 servings a day Low-fat dairy: 2 servings a day Fish, poultry, or lean meat: 1 serving a day Beans and legumes: 2 or more servings a week Nuts and seeds: 1-2 servings a day Whole grains: 6-8 servings a day Extra-virgin olive oil: 3-4 servings a day Limit red meat and sweets to only a few servings a month What are my food choices? Mediterranean diet Recommended Grains: Whole-grain pasta. Brown rice. Bulgar wheat. Polenta. Couscous. Whole-wheat bread. Dwyane Glad. Vegetables: Artichokes. Beets. Broccoli. Cabbage. Carrots. Eggplant. Green beans. Chard. Kale. Spinach. Onions. Leeks. Peas. Squash. Tomatoes. Peppers. Radishes. Fruits: Apples. Apricots. Avocado. Berries. Bananas. Cherries. Dates. Figs. Grapes. Lemons. Melon. Oranges. Peaches. Plums. Pomegranate. Meats and other protein foods: Beans. Almonds. Sunflower seeds. Pine nuts. Peanuts. Cod. Salmon. Scallops. Shrimp. Tuna. Tilapia. Clams. Oysters. Eggs. Dairy: Low-fat milk. Cheese. Greek yogurt. Beverages: Water. Red wine. Herbal tea. Fats and oils: Extra virgin olive oil. Avocado oil. Grape seed oil. Sweets and desserts: [place] yogurt with honey. Baked apples. Poached pears. Trail mix. Seasoning and other foods: Basil. Cilantro. Coriander.  Cumin. Mint. Parsley. Sage. Rosemary. Tarragon. Garlic. Oregano. Thyme. Pepper. Balsalmic vinegar. Tahini. Hummus. Tomato sauce. Olives. Mushrooms. Limit these Grains: Prepackaged pasta or rice dishes. Prepackaged cereal with added sugar. Vegetables: Deep fried potatoes (french fries). Fruits: Fruit canned in syrup. Meats and other protein foods: Beef. Pork. Lamb. Poultry with skin. Hot dogs. Helene Loader. Dairy: Ice cream. Sour cream. Whole milk. Beverages: Juice. Sugar-sweetened soft drinks. Beer. Liquor and spirits. Fats and oils: Butter. Canola oil. Vegetable oil. Beef fat (tallow). Lard. Sweets and desserts: Cookies. Cakes. Pies. Candy. Seasoning and other foods: Mayonnaise. Premade sauces and marinades. The items listed may not be a complete list. Talk with your dietitian about what dietary choices are right for you. Summary The Mediterranean diet includes both food and lifestyle choices. Eat a variety of fresh fruits and vegetables, beans, nuts, seeds, and whole grains. Limit the amount of red meat and sweets that you eat. Talk with your health care provider about whether it is safe for you to drink red wine in moderation. This means 1 glass a day for nonpregnant women and 2 glasses a day for men. A glass of wine equals 5 oz (150 mL). This information is not intended to replace advice given to you by your health care provider. Make sure you discuss any questions you have with your health care provider. Document Released: 08/20/2015 Document Revised: 09/22/2015 Document Reviewed: 08/20/2015 Elsevier Interactive Patient Education  2017 Elsevier Inc.
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
## 2023-07-03 ENCOUNTER — Encounter: Payer: Self-pay | Admitting: Physician Assistant

## 2023-07-05 ENCOUNTER — Encounter: Payer: Self-pay | Admitting: Physician Assistant

## 2023-08-02 DIAGNOSIS — H0102B Squamous blepharitis left eye, upper and lower eyelids: Secondary | ICD-10-CM | POA: Diagnosis not present

## 2023-08-02 DIAGNOSIS — H40053 Ocular hypertension, bilateral: Secondary | ICD-10-CM | POA: Diagnosis not present

## 2023-08-02 DIAGNOSIS — H0102A Squamous blepharitis right eye, upper and lower eyelids: Secondary | ICD-10-CM | POA: Diagnosis not present

## 2023-08-02 DIAGNOSIS — H2513 Age-related nuclear cataract, bilateral: Secondary | ICD-10-CM | POA: Diagnosis not present

## 2023-08-02 DIAGNOSIS — H04123 Dry eye syndrome of bilateral lacrimal glands: Secondary | ICD-10-CM | POA: Diagnosis not present

## 2023-08-02 DIAGNOSIS — B0239 Other herpes zoster eye disease: Secondary | ICD-10-CM | POA: Diagnosis not present

## 2023-08-02 DIAGNOSIS — H179 Unspecified corneal scar and opacity: Secondary | ICD-10-CM | POA: Diagnosis not present

## 2023-08-08 ENCOUNTER — Ambulatory Visit (INDEPENDENT_AMBULATORY_CARE_PROVIDER_SITE_OTHER): Admitting: Family Medicine

## 2023-08-08 ENCOUNTER — Ambulatory Visit: Payer: Self-pay

## 2023-08-08 VITALS — BP 120/82 | HR 69 | Temp 98.3°F | Ht 71.0 in | Wt 186.2 lb

## 2023-08-08 DIAGNOSIS — M2550 Pain in unspecified joint: Secondary | ICD-10-CM

## 2023-08-08 DIAGNOSIS — F411 Generalized anxiety disorder: Secondary | ICD-10-CM | POA: Insufficient documentation

## 2023-08-08 DIAGNOSIS — F1027 Alcohol dependence with alcohol-induced persisting dementia: Secondary | ICD-10-CM

## 2023-08-08 DIAGNOSIS — D696 Thrombocytopenia, unspecified: Secondary | ICD-10-CM | POA: Diagnosis not present

## 2023-08-08 DIAGNOSIS — F03911 Unspecified dementia, unspecified severity, with agitation: Secondary | ICD-10-CM | POA: Diagnosis not present

## 2023-08-08 LAB — CBC WITH DIFFERENTIAL/PLATELET
Basophils Absolute: 0 K/uL (ref 0.0–0.1)
Basophils Relative: 0.4 % (ref 0.0–3.0)
Eosinophils Absolute: 0.1 K/uL (ref 0.0–0.7)
Eosinophils Relative: 2.4 % (ref 0.0–5.0)
HCT: 40.7 % (ref 39.0–52.0)
Hemoglobin: 13.3 g/dL (ref 13.0–17.0)
Lymphocytes Relative: 25.8 % (ref 12.0–46.0)
Lymphs Abs: 1.1 K/uL (ref 0.7–4.0)
MCHC: 32.7 g/dL (ref 30.0–36.0)
MCV: 84.6 fl (ref 78.0–100.0)
Monocytes Absolute: 0.6 K/uL (ref 0.1–1.0)
Monocytes Relative: 14.7 % — ABNORMAL HIGH (ref 3.0–12.0)
Neutro Abs: 2.4 K/uL (ref 1.4–7.7)
Neutrophils Relative %: 56.7 % (ref 43.0–77.0)
Platelets: 132 K/uL — ABNORMAL LOW (ref 150.0–400.0)
RBC: 4.81 Mil/uL (ref 4.22–5.81)
RDW: 15.4 % (ref 11.5–15.5)
WBC: 4.3 K/uL (ref 4.0–10.5)

## 2023-08-08 MED ORDER — QUETIAPINE FUMARATE 25 MG PO TABS: 25.0000 mg | ORAL_TABLET | Freq: Every day | ORAL | 1 refills | Status: DC

## 2023-08-08 NOTE — Patient Instructions (Signed)
 We are checking labs today, will be in contact with any results that require further attention
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
## 2023-08-08 NOTE — Telephone Encounter (Signed)
 FYI Only or Action Required?: FYI only for provider.  Patient was last seen in primary care on 06/16/2023 by Joshua Debby CROME, MD.  Called Nurse Triage reporting Joint Swelling.  Symptoms began yesterday.  Interventions attempted: Nothing.  Symptoms are: gradually worsening.  Triage Disposition: See Physician Within 24 Hours  Patient/caregiver understands and will follow disposition?: Yes      Copied from CRM [id number]. Topic: Clinical - Red Word Triage >> Aug 08, 2023  9:16 AM Rosina BIRCH wrote: Reason for CRM: patient wife called stating the patient said both of his hands are hurting and both of his ankles are in pain. The patient can hardly walk Reason for Disposition  [1] Redness of the skin AND [2] no fever  Answer Assessment - Initial Assessment Questions 1. ONSET: When did the pain start?      Last night 2. LOCATION: Where is the pain located?      Both ankles 3. PAIN: How bad is the pain?  (Scale 1-10; or mild, moderate, severe)     Pt didn't say just said it hurts 4. WORK OR EXERCISE: Has there been any recent work or exercise that involved this part of the body?      no 5. CAUSE: What do you think is causing the ankle pain?     unknown 6. OTHER SYMPTOMS: Do you have any other symptoms? (e.g., calf pain, rash, fever, swelling)     Swelling and slight redness  Protocols used: Ankle Pain-A-AH

## 2023-08-10 ENCOUNTER — Encounter: Payer: Self-pay | Admitting: Family Medicine

## 2023-08-10 DIAGNOSIS — M2550 Pain in unspecified joint: Secondary | ICD-10-CM | POA: Insufficient documentation

## 2023-08-10 DIAGNOSIS — F03911 Unspecified dementia, unspecified severity, with agitation: Secondary | ICD-10-CM | POA: Insufficient documentation

## 2023-08-23 ENCOUNTER — Encounter: Payer: Self-pay | Admitting: Family Medicine

## 2023-08-31 ENCOUNTER — Other Ambulatory Visit: Payer: Self-pay | Admitting: Family Medicine

## 2023-08-31 ENCOUNTER — Ambulatory Visit

## 2023-08-31 VITALS — Ht 71.0 in | Wt 178.0 lb

## 2023-08-31 DIAGNOSIS — Z0000 Encounter for general adult medical examination without abnormal findings: Secondary | ICD-10-CM

## 2023-08-31 DIAGNOSIS — F411 Generalized anxiety disorder: Secondary | ICD-10-CM

## 2023-08-31 NOTE — Progress Notes (Signed)
 Subjective:   Keith Cobb is a 73 y.o. who presents for a Medicare Wellness preventive visit.  As a reminder, Annual Wellness Visits don't include a physical exam, and some assessments may be limited, especially if this visit is performed virtually. We may recommend an in-person follow-up visit with your provider if needed.  Visit Complete: Virtual I connected with  Keith Cobb on 08/31/23 by a audio enabled telemedicine application and verified that I am speaking with the correct person using two identifiers.  Patient Location: Home  Provider Location: Office/Clinic  I discussed the limitations of evaluation and management by telemedicine. The patient expressed understanding and agreed to proceed.  Vital Signs: Because this visit was a virtual/telehealth visit, some criteria may be missing or patient reported. Any vitals not documented were not able to be obtained and vitals that have been documented are patient reported.  VideoDeclined- This patient declined Librarian, academic. Therefore the visit was completed with audio only.  Persons Participating in Visit: Patient.  Assisted by Keith Cobb.  AWV Questionnaire: No: Patient Medicare AWV questionnaire was not completed prior to this visit.  Cardiac Risk Factors include: advanced age (>24men, >13 women);male gender;hypertension;smoking/ tobacco exposure     Objective:    Today's Vitals   08/31/23 1502  Weight: 178 lb (80.7 kg)  Height: 5' 11 (1.803 m)   Body mass index is 24.83 kg/m.     08/31/2023    3:02 PM 06/29/2023    2:09 PM 11/09/2020   12:36 PM 07/16/2018    1:29 PM 05/09/2016    1:30 PM  Advanced Directives  Does Patient Have a Medical Advance Directive? No No No No No   Would patient like information on creating a medical advance directive? No - Patient declined No - Patient declined No - Patient declined No - Patient declined       Data saved with a previous  flowsheet row definition    Current Medications (verified) Outpatient Encounter Medications as of 08/31/2023  Medication Sig   DULoxetine  (CYMBALTA ) 60 MG capsule TAKE 1 CAPSULE BY MOUTH DAILY   EPINEPHrine  (EPIPEN  2-PAK) 0.3 mg/0.3 mL IJ SOAJ injection Inject 0.3 mg into the muscle as needed for anaphylaxis.   QUEtiapine  (SEROQUEL ) 25 MG tablet TAKE 1 TABLET BY MOUTH EVERYDAY AT BEDTIME   rosuvastatin  (CRESTOR ) 10 MG tablet Take 1 tablet (10 mg total) by mouth daily.   No facility-administered encounter medications on file as of 08/31/2023.    Allergies (verified) Lisinopril  and Bee venom   History: Past Medical History:  Diagnosis Date   Allergy    bee   Anxiety    Arthritis    Depression    Hypertension    Past Surgical History:  Procedure Laterality Date   JOINT REPLACEMENT  2008   shoulder   Family History  Problem Relation Age of Onset   Stroke Father    Hearing loss Father    Social History   Socioeconomic History   Marital status: Married    Spouse name: Not on file   Number of children: 1   Years of education: 14   Highest education level: 12th grade  Occupational History   Not on file  Tobacco Use   Smoking status: Every Day    Current packs/day: 0.50    Average packs/day: 0.5 packs/day for 55.0 years (27.5 ttl pk-yrs)    Types: Cigarettes    Passive exposure: Current   Smokeless tobacco: Never  Substance and Sexual Activity   Alcohol use: Yes    Alcohol/week: 28.0 standard drinks of alcohol    Types: 28 Cans of beer per week   Drug use: No   Sexual activity: Yes    Partners: Female  Other Topics Concern   Not on file  Social History Narrative   Right handed   Married    One son   One floor home   Social Drivers of Health   Financial Resource Strain: Low Risk  (08/31/2023)   Overall Financial Resource Strain (CARDIA)    Difficulty of Paying Living Expenses: Not hard at all  Food Insecurity: No Food Insecurity (08/31/2023)   Hunger Vital  Sign    Worried About Running Out of Food in the Last Year: Never true    Ran Out of Food in the Last Year: Never true  Recent Concern: Food Insecurity - Food Insecurity Present (08/08/2023)   Hunger Vital Sign    Worried About Running Out of Food in the Last Year: Never true    Ran Out of Food in the Last Year: Sometimes true  Transportation Needs: No Transportation Needs (08/31/2023)   PRAPARE - Administrator, Civil Service (Medical): No    Lack of Transportation (Non-Medical): No  Physical Activity: Inactive (08/31/2023)   Exercise Vital Sign    Days of Exercise per Week: 0 days    Minutes of Exercise per Session: 0 min  Stress: No Stress Concern Present (08/31/2023)   [employer] of Occupational Health - Occupational Stress Questionnaire    Feeling of Stress: Not at all  Recent Concern: Stress - Stress Concern Present (08/08/2023)   [employer] of Occupational Health - Occupational Stress Questionnaire    Feeling of Stress: Very much  Social Connections: Moderately Isolated (08/31/2023)   Social Connection and Isolation Panel    Frequency of Communication with Friends and Family: Once a week    Frequency of Social Gatherings with Friends and Family: Never    Attends Religious Services: 1 to 4 times per year    Active Member of [employer] or Organizations: No    Attends Engineer, structural: Never    Marital Status: Married    Tobacco Counseling Ready to quit: No Counseling given: Yes    Clinical Intake:  Pre-visit preparation completed: Yes  Pain : No/denies pain     BMI - recorded: 24.83 Nutritional Status: BMI of 19-24  Normal Nutritional Risks: None Diabetes: No  Lab Results  Component Value Date   HGBA1C 5.6 10/03/2019   HGBA1C 5.5 07/16/2018   HGBA1C 5.4 05/19/2017     How often do you need to have someone help you when you read instructions, pamphlets, or other written materials from your doctor or pharmacy?: 3 - Sometimes  (Spouse helps)  Interpreter Needed?: No  Information entered by :: Verdie Saba, CMA   Activities of Daily Living     08/31/2023    3:07 PM  In your present state of health, do you have any difficulty performing the following activities:  Hearing? 0  Comment wears hearing aids  Vision? 0  Difficulty concentrating or making decisions? 1  Comment Spouse helps  Walking or climbing stairs? 1  Comment uses a cane/walker  Dressing or bathing? 0  Doing errands, shopping? 0  Preparing Food and eating ? N  Using the Toilet? N  In the past six months, have you accidently leaked urine? N  Do you have problems with loss  of bowel control? N  Managing your Medications? N  Comment Spouse helps  Managing your Finances? Y  Comment Spouse helps  Housekeeping or managing your Housekeeping? Y  Comment Spouse helps    Patient Care Team: Joshua Debby CROME, MD as PCP - General (Internal Medicine) [facility] (Ophthalmology)  I have updated your Care Teams any recent Medical Services you may have received from other providers in the past year.     Assessment:   This is a routine wellness examination for Keith III.  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 0 1 1 1 1   Number  falls in past yr: 0 1 1 1  0  Injury with Fall? 0 1 1 0 0  Risk for fall due to : No Fall Risks  Impaired balance/gait History of fall(s)   Follow up Falls evaluation completed;Falls prevention discussed Falls evaluation completed Falls evaluation completed Falls evaluation completed  Falls evaluation completed;Falls prevention discussed      Data saved with a previous flowsheet row definition    MEDICARE RISK AT HOME:  Medicare Risk at Home Any stairs in or around the home?: No If so, are there any without handrails?: No Home free of loose throw rugs in walkways, pet beds, electrical cords, etc?: Yes Adequate lighting in your home to reduce risk of falls?: Yes Life alert?: No Use of a cane, walker or w/c?: Yes (cane/walker) Grab bars in the bathroom?: Yes Shower chair or bench in shower?: Yes Elevated toilet seat or a handicapped toilet?: Yes  TIMED UP AND GO:  Was the test performed?  No  Cognitive Function: 6CIT completed      06/29/2023    6:00 PM  Montreal Cognitive Assessment   Visuospatial/ Executive (0/5) 1  Naming (0/3) 3  Attention: Read list of digits (0/2) 2  Attention: Read list of letters (0/1) 0  Attention: Serial 7 subtraction starting at 100 (0/3) 0  Language: Repeat phrase (0/2) 0  Language : Fluency (0/1) 0  Abstraction (0/2) 0  Delayed Recall (0/5) 0  Orientation (0/6) 0  Total 6  Adjusted Score (based on education) 6      08/31/2023    3:11 PM 09/09/2021    1:15 PM 07/16/2018    2:48 PM  6CIT Screen  What Year? 4 points 4 points 0 points  What month? 0 points 0 points  0 points  What time? 0 points 0 points 0 points  Count back from 20 0 points 0 points 0 points  Months in reverse 4 points 4 points 0 points  Repeat phrase 6 points 0 points 0 points  Total Score 14 points 8 points 0 points    Immunizations Immunization History  Administered Date(s) Administered   Fluad Quad(high Dose 65+) 10/02/2018, 10/03/2019, 09/23/2021, 12/15/2021    Influenza, High Dose Seasonal PF 11/03/2016, 10/25/2017, 09/28/2022   Influenza,inj,Quad PF,6+ Mos 10/30/2012, 12/31/2014, 11/06/2015   Influenza-Unspecified 10/16/2019, 10/13/2020, 09/23/2021   PFIZER(Purple Top)SARS-COV-2 Vaccination 07/08/2019, 07/29/2019   PNEUMOCOCCAL CONJUGATE-20 09/28/2022   Pneumococcal Conjugate-13 05/09/2016   Pneumococcal Polysaccharide-23 07/16/2018   Tdap 05/04/2015   Zoster Recombinant(Shingrix) 10/21/2019, 03/16/2020    Screening Tests Health Maintenance  Topic Date Due   Lung Cancer Screening  Never done   COVID-19 Vaccine (3 - 2024-25 season) 09/11/2022   INFLUENZA VACCINE  08/11/2023   Medicare Annual Wellness (AWV)  08/30/2024   DTaP/Tdap/Td (2 - Td or Tdap) 05/03/2025   Colonoscopy  07/31/2028   Pneumococcal Vaccine: 50+ Years  Completed   Hepatitis C Screening  Completed   Zoster Vaccines- Shingrix  Completed   HPV VACCINES  Aged Out   Meningococcal B Vaccine  Aged Out    Health Maintenance  Health Maintenance Due  Topic Date Due   Lung Cancer Screening  Never done   COVID-19 Vaccine (3 - 2024-25 season) 09/11/2022   INFLUENZA VACCINE  08/11/2023   Health Maintenance Items Addressed:  I have recommended that this patient have a Lung Cancer Screening but he declines at this time. I have discussed the risks and benefits of this procedure with him. The patient verbalizes understanding.   Additional Screening:  Vision Screening: Recommended annual ophthalmology exams for early detection of glaucoma and other disorders of the eye. Would you like a referral to an eye doctor? No    Dental Screening: Recommended annual dental exams for proper oral hygiene  Community Resource Referral / Chronic Care Management: CRR required this visit?  No   CCM required this visit?  No   Plan:    I have personally reviewed and noted the following in the patient's chart:   Medical and social history Use of alcohol, tobacco or illicit drugs   Current medications and supplements including opioid prescriptions. Patient is not currently taking opioid prescriptions. Functional ability and status Nutritional status Physical activity Advanced directives List of other physicians Hospitalizations, surgeries, and ER visits in previous 12 months Vitals Screenings to include cognitive, depression, and falls Referrals and appointments  In addition, I have reviewed and discussed with patient certain preventive protocols, quality metrics, and best practice recommendations. A written personalized care plan for preventive services as well as general preventive health recommendations were provided to patient.   Verdie CHRISTELLA Saba, CMA   08/31/2023   After Visit Summary: (MyChart) Due to this being a telephonic visit, the after visit summary with patients personalized plan was offered to patient via MyChart   Notes: Please refer to Routing Comments. Patient declined to have the Lung Cancer Screening test (Current Smoker).  6CIT Cognitive test score = 14.

## 2023-08-31 NOTE — Patient Instructions (Addendum)
 [name] , Thank you for taking time out of your busy schedule to complete your Annual Wellness Visit with me. I enjoyed our conversation and look forward to speaking with you again next year. I, as well as your care team,  appreciate your ongoing commitment to your health goals. Please review the following plan we discussed and let me know if I can assist you in the future. Your Game plan/ To Do List    Referrals: If you haven't heard from the office you've been referred to, please reach out to them at the phone provided.   Follow up Visits: We will see or speak with you next year for your Next Medicare AWV with our clinical staff Have you seen your provider in the last 6 months (3 months if uncontrolled diabetes)? No  Clinician Recommendations:  Aim for 30 minutes of exercise or brisk walking, 6-8 glasses of water, and 5 servings of fruits and vegetables each day. Educated and advised on getting the Lung Cancer Screening test due to smoking.        This is a list of the screenings recommended for you:  Health Maintenance  Topic Date Due   Screening for Lung Cancer  Never done   COVID-19 Vaccine (3 - 2024-25 season) 09/11/2022   Flu Shot  08/11/2023   Medicare Annual Wellness Visit  08/30/2024   DTaP/Tdap/Td vaccine (2 - Td or Tdap) 05/03/2025   Colon Cancer Screening  07/31/2028   Pneumococcal Vaccine for age over 70  Completed   Hepatitis C Screening  Completed   Zoster (Shingles) Vaccine  Completed   HPV Vaccine  Aged Out   Meningitis B Vaccine  Aged Out    Advanced directives: (Declined) Advance directive discussed with you today. Even though you declined this today, please call our office should you change your mind, and we can give you the proper paperwork for you to fill out. Advance Care Planning is important because it:  [x]  Makes sure you receive the medical care that is consistent with your values, goals, and preferences  [x]  It provides guidance to your family and  loved ones and reduces their decisional burden about whether or not they are making the right decisions based on your wishes.  Follow the link provided in your after visit summary or read over the paperwork we have mailed to you to help you started getting your Advance Directives in place. If you need assistance in completing these, please reach out to us  so that we can help you!

## 2023-09-14 ENCOUNTER — Encounter: Payer: Self-pay | Admitting: Physician Assistant

## 2023-09-14 ENCOUNTER — Encounter

## 2023-09-26 ENCOUNTER — Ambulatory Visit: Admitting: Internal Medicine

## 2023-09-26 ENCOUNTER — Ambulatory Visit (INDEPENDENT_AMBULATORY_CARE_PROVIDER_SITE_OTHER)

## 2023-09-26 ENCOUNTER — Ambulatory Visit: Payer: Self-pay | Admitting: Internal Medicine

## 2023-09-26 ENCOUNTER — Encounter: Payer: Self-pay | Admitting: Internal Medicine

## 2023-09-26 VITALS — BP 126/82 | HR 74 | Temp 98.6°F | Ht 71.0 in | Wt 193.0 lb

## 2023-09-26 DIAGNOSIS — M25531 Pain in right wrist: Secondary | ICD-10-CM

## 2023-09-26 DIAGNOSIS — S80811A Abrasion, right lower leg, initial encounter: Secondary | ICD-10-CM | POA: Diagnosis not present

## 2023-09-26 DIAGNOSIS — R0781 Pleurodynia: Secondary | ICD-10-CM

## 2023-09-26 DIAGNOSIS — W19XXXA Unspecified fall, initial encounter: Secondary | ICD-10-CM | POA: Diagnosis not present

## 2023-09-26 DIAGNOSIS — F0390 Unspecified dementia without behavioral disturbance: Secondary | ICD-10-CM

## 2023-09-26 DIAGNOSIS — S51011A Laceration without foreign body of right elbow, initial encounter: Secondary | ICD-10-CM

## 2023-09-26 DIAGNOSIS — M19031 Primary osteoarthritis, right wrist: Secondary | ICD-10-CM | POA: Diagnosis not present

## 2023-09-26 NOTE — Progress Notes (Signed)
 Subjective:    Patient ID: Keith Cobb, male    DOB: June 23, 1950, 73 y.o.   MRN: [id number]      HPI Keith Cobb is here for  Chief Complaint  Patient presents with   Accident on lawn-mover    Accident on lawn-mover yesterday; right wrist pain ; right rib pain (flew off lawn-mover yesterday)  He is here with his wife who supplements the history some because of his dementia and being a poor historian.  Yesterday he was on the lawn mower and ran into something and fell off the mower.  He rolled down the hill.  EMS did not evaluate him at home.  He is unsure if he hit his head.  He did have a couple of headaches yesterday for which she took Tylenol  and that relieved the headaches.  He has not had any headaches today.  He has sharp pain in his right ribs and it does hurt more with deep breaths and movement.  He states right wrist pain.  He has some abrasions on his right forearm, bruising on his left forearm and abrasion on his right lower leg.  He has been taking Tylenol  which has helped.  Medications and allergies reviewed with patient and updated if appropriate.  Current Outpatient Medications on File Prior to Visit  Medication Sig Dispense Refill   DULoxetine  (CYMBALTA ) 60 MG capsule TAKE 1 CAPSULE BY MOUTH DAILY 90 capsule 3   EPINEPHrine  (EPIPEN  2-PAK) 0.3 mg/0.3 mL IJ SOAJ injection Inject 0.3 mg into the muscle as needed for anaphylaxis. 1 each 0   QUEtiapine  (SEROQUEL ) 25 MG tablet TAKE 1 TABLET BY MOUTH EVERYDAY AT BEDTIME 90 tablet 1   rosuvastatin  (CRESTOR ) 10 MG tablet Take 1 tablet (10 mg total) by mouth daily. 100 tablet 0   No current facility-administered medications on file prior to visit.    Review of Systems  Constitutional:  Negative for fever.  Eyes:  Negative for visual disturbance.  Respiratory:  Negative for shortness of breath.   Cardiovascular:  Positive for chest pain (right ribs and right side of chest).  Gastrointestinal:  Negative for nausea.   Neurological:  Positive for headaches (yesterday). Negative for dizziness and light-headedness.       Objective:   Vitals:   09/26/23 1523  BP: 126/82  Pulse: 74  Temp: 98.6 F (37 C)  SpO2: 97%   BP Readings from Last 3 Encounters:  09/26/23 126/82  08/08/23 120/82  06/29/23 (!) 150/70   Wt Readings from Last 3 Encounters:  09/26/23 193 lb (87.5 kg)  08/31/23 178 lb (80.7 kg)  08/08/23 186 lb 3.2 oz (84.5 kg)   Body mass index is 26.92 kg/m.    Physical Exam Constitutional:      General: He is not in acute distress.    Appearance: Normal appearance. He is not ill-appearing.  HENT:     Head: Normocephalic and atraumatic.  Cardiovascular:     Rate and Rhythm: Normal rate and regular rhythm.  Pulmonary:     Effort: Pulmonary effort is normal. No respiratory distress.     Breath sounds: Normal breath sounds. No wheezing or rales.  Chest:     Chest wall: Tenderness (Right lower anterior ribs) present.  Abdominal:     General: There is no distension.     Palpations: Abdomen is soft.     Tenderness: There is no abdominal tenderness.  Musculoskeletal:        General: Tenderness (Tenderness and swelling  right posterior wrist.  Increased pain with movement of the wrist.  No obvious deformity.  No bruising) present.  Skin:    General: Skin is warm and dry.     Findings: Bruising (Left forearm) present.     Comments: Small abrasion right anterior mid lower leg without surrounding erythema, bleeding or discharge.  There is a couple of small ulcers.  Small skin tear and abrasion right forearm-no active bleeding or discharge.  Neurological:     Mental Status: He is alert.        DG Ribs Unilateral W/Chest Right CLINICAL DATA:  Right rib pain after recent falls.  EXAM: RIGHT RIBS AND CHEST - 3+ VIEW  COMPARISON:  December 22, 2021.  FINDINGS: No fracture or other bone lesions are seen involving the ribs. There is no evidence of pneumothorax or pleural effusion.  Minimal left basilar subsegmental scarring is noted. Heart size and mediastinal contours are within normal limits.  IMPRESSION: Negative.  Electronically Signed   By: Lynwood Landy Raddle M.D.   On: 09/26/2023 16:20 DG Wrist Complete Right CLINICAL DATA:  Right wrist pain after fall.  EXAM: RIGHT WRIST - COMPLETE 3+ VIEW  COMPARISON:  None Available.  FINDINGS: There is no evidence of fracture or dislocation. Mild narrowing of radiocarpal joint is noted. Soft tissues are unremarkable.  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 No acute abnormality seen.  Electronically Signed   By: Lynwood Landy Raddle M.D.   On: 09/26/2023 16:17      Assessment & Plan:    Fall, right wrist pain, right rib pain, skin tear, abrasions: Keith Cobb yesterday-he was on his lawn more and must of hit something and fell off the lawn more and rolled down the hill Areas of bruising and abrasion-mild in nature He did injure his right wrist which is swollen and tender-x-ray today to rule out fracture He is having some right lower rib pain-likely contusion, but will rule out a fracture with an x-ray. Continue Tylenol  for pain Apply Vaseline or antibacterial ointment to skin abrasions Continues to use heat or ice for symptom relief Follow-up pain does not improve

## 2023-09-26 NOTE — Patient Instructions (Addendum)
      Have x-rays downstairs    Medications changes include :   None - take Tylenol  as needed for your pain.

## 2023-09-27 ENCOUNTER — Other Ambulatory Visit: Payer: Self-pay | Admitting: Internal Medicine

## 2023-09-27 DIAGNOSIS — E785 Hyperlipidemia, unspecified: Secondary | ICD-10-CM

## 2023-10-15 ENCOUNTER — Ambulatory Visit (HOSPITAL_COMMUNITY): Payer: Self-pay | Admitting: Physician Assistant

## 2023-10-15 ENCOUNTER — Ambulatory Visit (HOSPITAL_COMMUNITY)
Admission: EM | Admit: 2023-10-15 | Discharge: 2023-10-15 | Disposition: A | Discharge status: Home / Self Care | Attending: Physician Assistant | Admitting: Physician Assistant

## 2023-10-15 ENCOUNTER — Other Ambulatory Visit: Payer: Self-pay

## 2023-10-15 ENCOUNTER — Ambulatory Visit (HOSPITAL_COMMUNITY)

## 2023-10-15 ENCOUNTER — Encounter (HOSPITAL_COMMUNITY): Payer: Self-pay | Admitting: Emergency Medicine

## 2023-10-15 ENCOUNTER — Ambulatory Visit (INDEPENDENT_AMBULATORY_CARE_PROVIDER_SITE_OTHER)

## 2023-10-15 DIAGNOSIS — M79642 Pain in left hand: Secondary | ICD-10-CM | POA: Diagnosis not present

## 2023-10-15 DIAGNOSIS — M109 Gout, unspecified: Secondary | ICD-10-CM

## 2023-10-15 DIAGNOSIS — M25532 Pain in left wrist: Secondary | ICD-10-CM | POA: Diagnosis not present

## 2023-10-15 DIAGNOSIS — M129 Arthropathy, unspecified: Secondary | ICD-10-CM | POA: Diagnosis not present

## 2023-10-15 DIAGNOSIS — M7989 Other specified soft tissue disorders: Secondary | ICD-10-CM | POA: Diagnosis not present

## 2023-10-15 MED ORDER — PREDNISONE 20 MG PO TABS: 20.0000 mg | ORAL_TABLET | Freq: Every day | ORAL | 0 refills | Status: AC

## 2023-10-15 NOTE — ED Triage Notes (Signed)
 Swelling and pain to left hand and wrist yesterday morning.  History of gout

## 2023-10-15 NOTE — ED Provider Notes (Signed)
 [facility]    CSN: [id number] Arrival date & time: 10/15/23  1229      History   Chief Complaint No chief complaint on file.   HPI Keith Cobb is a 73 y.o. male.   Patient has a history of dementia and so his wife provided the majority of history.  Patient was very agitated and had difficulty answering directed questions and constantly had to be redirected.  Wife reports that yesterday he started complaining about his left hand and wrist pain painful.  He has been holding it and not using it for the past few hours.  They have given him Tylenol  without improvement of symptoms.  Denies any known injury or increase in activity.  Denies any recent falls or trauma.  He does have a history of gout and has had gout in his wrist before.  He is no longer followed by orthopedics and his last uric acid level was done about 8 years ago and was normal at 6.0.  He does not take any uric acid lowering medications.  He last had imaging of his left wrist 12/08/2009 that was normal.  Denies any previous injury or surgery involving his wrist.  Unfortunately, wife reports that it is very difficult to determine if something happened because he is confused at baseline.  She reports that he is eating and drinking normally and denies any known fever, nausea, vomiting.    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/2023   Benign prostatic hyperplasia without lower urinary tract  symptoms 05/04/2023   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) 05/04/2023   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   Thrombocytopenia 03/23/2022   Screening for lung cancer 03/23/2022   Abnormal electrocardiogram 12/19/2021   Dyslipidemia, goal LDL below 130 12/15/2021   Encounter for general adult medical examination with abnormal findings 12/15/2021   Need for prophylactic vaccination and inoculation against influenza 12/15/2021   Abnormal electrocardiogram (ECG) (EKG) 12/15/2021   Osteoarthritis of left knee 06/04/2020   Tobacco use 06/04/2020   Sensorineural hearing loss, 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 Authorizing Provider  predniSONE  (DELTASONE ) 20 MG tablet Take 1 tablet (20 mg total) by mouth daily for 5 days. 10/15/23 10/20/23 Yes Jillian Pianka K, PA-C  DULoxetine  (CYMBALTA ) 60 MG capsule TAKE 1 CAPSULE BY MOUTH DAILY 03/23/23   Joshua Debby CROME, MD  EPINEPHrine  (EPIPEN  2-PAK) 0.3 mg/0.3 mL IJ SOAJ injection Inject 0.3 mg into the muscle as needed for anaphylaxis. 08/18/21   Levora Reyes SAUNDERS, MD  QUEtiapine  (SEROQUEL ) 25 MG tablet TAKE 1 TABLET BY MOUTH EVERYDAY AT BEDTIME 08/31/23   Alvia Corean CROME, FNP  rosuvastatin  (CRESTOR ) 10 MG tablet TAKE 1 TABLET BY MOUTH DAILY 10/02/23   Joshua Debby CROME, MD    Family History Family History  Problem Relation Age of Onset   Stroke Father    Hearing loss Father     Social History Social History   Tobacco Use   Smoking status: Every Day    Current packs/day: 0.50    Average packs/day: 0.5 packs/day for 55.0 years (27.5 ttl pk-yrs)    Types: Cigarettes    Passive exposure: Current   Smokeless tobacco: Never  Substance Use Topics   Alcohol use: Yes     Alcohol/week: 28.0 standard drinks of alcohol    Types: 28 Cans of beer per week   Drug use: No     Allergies   Lisinopril  and Bee venom   Review of Systems Review of Systems  Constitutional:  Positive for activity change. Negative for appetite change, fatigue and fever.  Musculoskeletal:  Positive for arthralgias and joint swelling. Negative for myalgias.  Skin:  Negative for color change and wound.  Neurological:  Negative for weakness and numbness.     Physical Exam Triage Vital Signs ED Triage Vitals [10/15/23 1351]  Encounter Vitals Group     BP (!) 165/87     Girls Systolic BP Percentile      Girls Diastolic BP Percentile      Boys Systolic BP Percentile      Boys Diastolic BP Percentile      Pulse Rate 77     Resp (!) 26     Temp 97.9 F (36.6 C)     Temp Source Oral     SpO2 98 %     Weight      Height      Head Circumference      Peak Flow      Pain Score      Pain Loc      Pain Education      Exclude from Growth Chart    No data found.  Updated Vital Signs BP (!) 165/87 (BP Location: Right Arm)   Pulse 77   Temp 97.9 F (36.6 C) (Oral)   Resp (!) 26   SpO2 98%   Visual Acuity Right Eye Distance:   Left Eye Distance:   Bilateral Distance:    Right Eye Near:   Left Eye Near:    Bilateral Near:     Physical Exam Vitals reviewed.  Constitutional:      General: He is awake.     Appearance: Normal appearance. He is well-developed. He is not ill-appearing.     Comments: Confused and agitated male having to be redirected and at times tearful in no acute distress  HENT:     Head: Normocephalic and atraumatic.  Cardiovascular:     Rate and Rhythm: Normal rate and regular rhythm.     Heart sounds: Normal heart sounds, S1 normal and S2 normal. No murmur heard.    Comments: Capillary refill slightly delayed within 3 seconds bilateral hands Pulmonary:     Effort: Pulmonary effort is normal.     Breath sounds: Normal breath sounds. No stridor. No  wheezing, rhonchi or rales.     Comments: Clear to auscultation bilaterally Musculoskeletal:     Left wrist: Swelling, deformity and tenderness present. No bony tenderness or snuff box tenderness. Decreased range of motion.     Left hand: Swelling and tenderness present. No bony tenderness. Decreased range of motion. There is no  disruption of two-point discrimination. Normal capillary refill.     Comments: Left wrist and hand: Swelling with some deformity noted volar wrist with associated swelling in his hand.  He does have slightly delayed capillary refill but this appears symmetric including his unaffected right hand.  He does have normal sensation as well as pincer and grip strength.  He reports pain and difficulty with movement of the wrist and hand.  Neurological:     Mental Status: He is alert.  Psychiatric:        Behavior: Behavior is agitated. Behavior is cooperative.      UC Treatments / Results  Labs (all labs ordered are listed, but only abnormal results are displayed) Labs Reviewed - No data to display  EKG   Radiology DG Wrist Complete Left Result Date: 10/15/2023 CLINICAL DATA:  swelling EXAM: LEFT WRIST - COMPLETE 3+ VIEW COMPARISON:  X-ray right wrist 09/26/2023 for comparison. FINDINGS: There is no evidence of fracture or dislocation. Moderate to severe arthropathy of the carpal bones. Subchondral cystic changes of scaphoid, lunate, capitate. Chondrocalcinosis. Diffuse proximal hand/wrist subcutaneus soft tissue edema. IMPRESSION: 1. No acute displaced fracture or dislocation. 2. 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 Diffuse proximal hand/wrist subcutaneus soft tissue edema. Electronically Signed   By: Morgane  Naveau M.D.   On: 10/15/2023 14:42    Procedures Procedures (including critical care time)  Medications Ordered in UC Medications - No data to display  Initial Impression / Assessment and Plan / UC Course   I have reviewed the triage vital signs and the nursing notes.  Pertinent labs & imaging results that were available during my care of the patient were reviewed by me and considered in my medical decision making (see chart for details).     Patient is well-appearing, in no acute distress.  He was extremely agitated during the visit which significantly limited our ability to evaluate him.  He did report significant pain with palpation of the wrist and so given the associated swelling we attempted to obtain x-rays.  Unfortunately, he was unable to follow the instructions and became increasingly more agitated so we were only able to get a few views of the wrist and could not move forward with the hand x-ray that was originally ordered.  X-ray of wrist did show arthropathy and we discussed that symptoms could be related to gout.  Will treat with a short course of low-dose prednisone  as he has successfully managed gout symptoms in the past with this medication.  Denies any history of diabetes.  20 mg for 5 days sent to pharmacy and we discussed that he is not to combine this with NSAIDs due to risk of GI bleeding.  His hand is neurovascularly intact and low suspicion for arterial or venous thrombosis though we did discuss that given our limited ability to examine him it is difficult to say with certainty that there is not something more serious going on.  Wife did not think that he would tolerate going to the emergency room and so I did agree to give a short course of prednisone  in the hopes that this will provide significant improvement of symptoms we discussed that if he is not feeling better within a few days or if anything changes and he has increasing swelling, discoloration of the hand, numbness or tingling, increased agitation he should go to the ER for further evaluation and management.  Assuming improvement of symptoms it may be worthwhile to follow-up  with a hand specialist and he was given the contact  information for local provider with instruction for wife to call and schedule an appointment.  Patient became increasingly agitated the end of the visit and so wife was not provided AVS and instead I sent the information for hand specialist as part of result note through MyChart which she has access to.  Final Clinical Impressions(s) / UC Diagnoses   Final diagnoses:  Left wrist pain  Left hand pain  Acute gout of left wrist, unspecified cause   Discharge Instructions   None    ED Prescriptions     Medication Sig Dispense Auth. Provider   predniSONE  (DELTASONE ) 20 MG tablet Take 1 tablet (20 mg total) by mouth daily for 5 days. 5 tablet Adith Tejada K, PA-C      I have reviewed the PDMP during this encounter.   Sherrell Rocky POUR, PA-C 10/15/23 1453

## 2023-10-24 ENCOUNTER — Encounter: Payer: Self-pay | Admitting: Internal Medicine

## 2023-10-24 ENCOUNTER — Ambulatory Visit (INDEPENDENT_AMBULATORY_CARE_PROVIDER_SITE_OTHER): Admitting: Internal Medicine

## 2023-10-24 VITALS — BP 130/80 | HR 90 | Temp 98.7°F | Ht 71.0 in | Wt 197.8 lb

## 2023-10-24 DIAGNOSIS — M109 Gout, unspecified: Secondary | ICD-10-CM | POA: Diagnosis not present

## 2023-10-24 DIAGNOSIS — M1712 Unilateral primary osteoarthritis, left knee: Secondary | ICD-10-CM

## 2023-10-24 DIAGNOSIS — M5030 Other cervical disc degeneration, unspecified cervical region: Secondary | ICD-10-CM | POA: Diagnosis not present

## 2023-10-24 DIAGNOSIS — F03911 Unspecified dementia, unspecified severity, with agitation: Secondary | ICD-10-CM

## 2023-10-24 DIAGNOSIS — F02B11 Dementia in other diseases classified elsewhere, moderate, with agitation: Secondary | ICD-10-CM

## 2023-10-24 DIAGNOSIS — I10 Essential (primary) hypertension: Secondary | ICD-10-CM

## 2023-10-24 DIAGNOSIS — D696 Thrombocytopenia, unspecified: Secondary | ICD-10-CM

## 2023-10-24 DIAGNOSIS — F1027 Alcohol dependence with alcohol-induced persisting dementia: Secondary | ICD-10-CM

## 2023-10-24 DIAGNOSIS — M4802 Spinal stenosis, cervical region: Secondary | ICD-10-CM | POA: Diagnosis not present

## 2023-10-24 DIAGNOSIS — G301 Alzheimer's disease with late onset: Secondary | ICD-10-CM | POA: Diagnosis not present

## 2023-10-24 DIAGNOSIS — F03918 Unspecified dementia, unspecified severity, with other behavioral disturbance: Secondary | ICD-10-CM

## 2023-10-24 LAB — CBC WITH DIFFERENTIAL/PLATELET
Basophils Absolute: 0 K/uL (ref 0.0–0.1)
Basophils Relative: 0.4 % (ref 0.0–3.0)
Eosinophils Absolute: 0.1 K/uL (ref 0.0–0.7)
Eosinophils Relative: 1.6 % (ref 0.0–5.0)
HCT: 37.8 % — ABNORMAL LOW (ref 39.0–52.0)
Hemoglobin: 12.4 g/dL — ABNORMAL LOW (ref 13.0–17.0)
Lymphocytes Relative: 18.2 % (ref 12.0–46.0)
Lymphs Abs: 0.9 K/uL (ref 0.7–4.0)
MCHC: 32.9 g/dL (ref 30.0–36.0)
MCV: 82 fl (ref 78.0–100.0)
Monocytes Absolute: 0.9 K/uL (ref 0.1–1.0)
Monocytes Relative: 16.8 % — ABNORMAL HIGH (ref 3.0–12.0)
Neutro Abs: 3.3 K/uL (ref 1.4–7.7)
Neutrophils Relative %: 63 % (ref 43.0–77.0)
Platelets: 156 K/uL (ref 150.0–400.0)
RBC: 4.62 Mil/uL (ref 4.22–5.81)
RDW: 15.1 % (ref 11.5–15.5)
WBC: 5.2 K/uL (ref 4.0–10.5)

## 2023-10-24 LAB — BASIC METABOLIC PANEL WITH GFR
BUN: 14 mg/dL (ref 6–23)
CO2: 29 meq/L (ref 19–32)
Calcium: 8.8 mg/dL (ref 8.4–10.5)
Chloride: 101 meq/L (ref 96–112)
Creatinine, Ser: 0.82 mg/dL (ref 0.40–1.50)
GFR: 87.49 mL/min (ref >60.00)
Glucose, Bld: 101 mg/dL — ABNORMAL HIGH (ref 70–99)
Potassium: 3.9 meq/L (ref 3.5–5.1)
Sodium: 137 meq/L (ref 135–145)

## 2023-10-24 LAB — URIC ACID: Uric Acid, Serum: 5.6 mg/dL (ref 4.0–7.8)

## 2023-10-24 LAB — TSH: TSH: 1.29 u[IU]/mL (ref 0.35–5.50)

## 2023-10-24 LAB — C-REACTIVE PROTEIN: CRP: 0.9 mg/dL (ref 0.5–20.0)

## 2023-10-24 MED ORDER — REXULTI 0.5 MG PO TABS: 1.0000 | ORAL_TABLET | Freq: Every day | ORAL | 0 refills | Status: AC

## 2023-10-24 MED ORDER — METHYLPREDNISOLONE ACETATE 40 MG/ML IJ SUSP
120.0000 mg | Freq: Once | INTRAMUSCULAR | Status: AC
  Administered 2023-10-24: 120 mg via INTRAMUSCULAR

## 2023-10-24 MED ORDER — COLCHICINE 0.6 MG PO TABS: 0.6000 mg | ORAL_TABLET | Freq: Two times a day (BID) | ORAL | 1 refills | Status: DC

## 2023-10-24 NOTE — Patient Instructions (Signed)
Gout  Gout is a condition that causes painful swelling of the joints. Gout is a type of inflammation of the joints (arthritis). This condition is caused by having too much uric acid in the body. Uric acid is a chemical that forms when the body breaks down substances called purines. Purines are important for building body proteins. When the body has too much uric acid, sharp crystals can form and build up inside the joints. This causes pain and swelling. Gout attacks can happen quickly and may be very painful (acute gout). Over time, the attacks can affect more joints and become more frequent (chronic gout). Gout can also cause uric acid to build up under the skin and inside the kidneys. What are the causes? This condition is caused by too much uric acid in your blood. This can happen because: Your kidneys do not remove enough uric acid from your blood. This is the most common cause. Your body makes too much uric acid. This can happen with some cancers and cancer treatments. It can also occur if your body is breaking down too many red blood cells (hemolytic anemia). You eat too many foods that are high in purines. These foods include organ meats and some seafood. Alcohol, especially beer, is also high in purines. A gout attack may be triggered by trauma or stress. What increases the risk? The following factors may make you more likely to develop this condition: Having a family history of gout. Being male and middle-aged. Being male and having gone through menopause. Taking certain medicines, including aspirin, cyclosporine, diuretics, levodopa, and niacin. Having an organ transplant. Having certain conditions, such as: Being obese. Lead poisoning. Kidney disease. A skin condition called psoriasis. Other factors include: Losing weight too quickly. Being dehydrated. Frequently drinking alcohol, especially beer. Frequently drinking beverages that are sweetened with a type of sugar called  fructose. What are the signs or symptoms? An attack of acute gout happens quickly. It usually occurs in just one joint. The most common place is the big toe. Attacks often start at night. Other joints that may be affected include joints of the feet, ankle, knee, fingers, wrist, or elbow. Symptoms of this condition may include: Severe pain. Warmth. Swelling. Stiffness. Tenderness. The affected joint may be very painful to touch. Shiny, red, or purple skin. Chills and fever. Chronic gout may cause symptoms more frequently. More joints may be involved. You may also have white or yellow lumps (tophi) on your hands or feet or in other areas near your joints. How is this diagnosed? This condition is diagnosed based on your symptoms, your medical history, and a physical exam. You may have tests, such as: Blood tests to measure uric acid levels. Removal of joint fluid with a thin needle (aspiration) to look for uric acid crystals. X-rays to look for joint damage. How is this treated? Treatment for this condition has two phases: treating an acute attack and preventing future attacks. Acute gout treatment may include medicines to reduce pain and swelling, including: NSAIDs, such as ibuprofen. Steroids. These are strong anti-inflammatory medicines that can be taken by mouth (orally) or injected into a joint. Colchicine. This medicine relieves pain and swelling when it is taken soon after an attack. It can be given by mouth or through an IV. Preventive treatment may include: Daily use of smaller doses of NSAIDs or colchicine. Use of a medicine that reduces uric acid levels in your blood, such as allopurinol. Changes to your diet. You may need to see   a dietitian about what to eat and drink to prevent gout. Follow these instructions at home: During a gout attack  If directed, put ice on the affected area. To do this: Put ice in a plastic bag. Place a towel between your skin and the bag. Leave the  ice on for 20 minutes, 2-3 times a day. Remove the ice if your skin turns bright red. This is very important. If you cannot feel pain, heat, or cold, you have a greater risk of damage to the area. Raise (elevate) the affected joint above the level of your heart as often as possible. Rest the joint as much as possible. If the affected joint is in your leg, you may be given crutches to use. Follow instructions from your health care provider about eating or drinking restrictions. Avoiding future gout attacks Follow a low-purine diet as told by your dietitian or health care provider. Avoid foods and drinks that are high in purines, including liver, kidney, anchovies, asparagus, herring, mushrooms, mussels, and beer. Maintain a healthy weight or lose weight if you are overweight. If you want to lose weight, talk with your health care provider. Do not lose weight too quickly. Start or maintain an exercise program as told by your health care provider. Eating and drinking Avoid drinking beverages that contain fructose. Drink enough fluids to keep your urine pale yellow. If you drink alcohol: Limit how much you have to: 0-1 drink a day for women who are not pregnant. 0-2 drinks a day for men. Know how much alcohol is in a drink. In the [place] one drink equals one 12 oz bottle of beer (355 mL), one 5 oz glass of wine (148 mL), or one 1 oz glass of hard liquor (44 mL). General instructions Take over-the-counter and prescription medicines only as told by your health care provider. Ask your health care provider if the medicine prescribed to you requires you to avoid driving or using machinery. Return to your normal activities as told by your health care provider. Ask your health care provider what activities are safe for you. Keep all follow-up visits. This is important. Where to find more information National Institutes of Health: www.niams.nih.gov Contact a health care provider if you have: Another  gout attack. Continuing symptoms of a gout attack after 10 days of treatment. Side effects from your medicines. Chills or a fever. Burning pain when you urinate. Pain in your lower back or abdomen. Get help right away if you: Have severe or uncontrolled pain. Cannot urinate. Summary Gout is painful swelling of the joints caused by having too much uric acid in the body. The most common site for gout to occur is in the big toe, but it can affect other joints in the body. Medicines and dietary changes can help to prevent and treat gout attacks. This information is not intended to replace advice given to you by your health care provider. Make sure you discuss any questions you have with your health care provider. Document Revised: 09/30/2020 Document Reviewed: 09/30/2020 Elsevier Patient Education  2024 [employer] Inc[employer]  

## 2023-10-24 NOTE — Progress Notes (Signed)
 Subjective:  Patient ID: Keith Cobb, male    DOB: Nov 22, 1950  Age: 73 y.o. MRN: [id number]  CC: Gout Flare Up  (ER visit Sunday before last. )   HPI Keith Cobb presents for f/up ----  Discussed the use of AI scribe software for clinical note transcription with the patient, who gave verbal consent to proceed.  History of Present Illness Keith Cobb is a 73 year old male with gout who presents with left wrist pain and swelling.  He has been experiencing left wrist pain and swelling. The symptoms prompted an urgent care visit where the wrist was swollen and dark red. He has a history of gout in the same wrist. At urgent care, he was prescribed prednisone , one pill daily for five days, which reduced the swelling but did not resolve it completely. An x-ray was performed, but due to difficulty positioning his hand, no fractures were identified. He did not sustain any recent trauma to the wrist, although he had a fall about a month and a half ago, which did not result in wrist injury. Currently, he takes Tylenol  for pain relief.  He reports neck pain and stiffness, the cause of which is unclear. There is no mention of any specific injury or event leading to the neck pain.  His social history includes a significant reduction in alcohol and tobacco use. He rarely drinks or uses tobacco now, and he goes to bed early. He has a history of being easily agitated.  He has received a flu shot and another vaccine recently, with no reported adverse effects.     Outpatient Medications Prior to Visit  Medication Sig Dispense Refill   DULoxetine  (CYMBALTA ) 60 MG capsule TAKE 1 CAPSULE BY MOUTH DAILY 90 capsule 3   EPINEPHrine  (EPIPEN  2-PAK) 0.3 mg/0.3 mL IJ SOAJ injection Inject 0.3 mg into the muscle as needed for anaphylaxis. 1 each 0   rosuvastatin  (CRESTOR ) 10 MG tablet TAKE 1 TABLET BY MOUTH DAILY 90 tablet 0   QUEtiapine  (SEROQUEL ) 25 MG tablet TAKE 1 TABLET BY MOUTH EVERYDAY AT  BEDTIME 90 tablet 1   No facility-administered medications prior to visit.    ROS Review of Systems  Objective:  BP 130/80 (BP Location: Left Arm, Patient Position: Sitting, Cuff Size: Normal)   Pulse 90   Temp 98.7 F (37.1 C) (Temporal)   Ht 5' 11 (1.803 m)   Wt 197 lb 12.8 oz (89.7 kg)   SpO2 98%   BMI 27.59 kg/m   BP Readings from Last 3 Encounters:  10/24/23 130/80  10/15/23 (!) 165/87  09/26/23 126/82    Wt Readings from Last 3 Encounters:  10/24/23 197 lb 12.8 oz (89.7 kg)  09/26/23 193 lb (87.5 kg)  08/31/23 178 lb (80.7 kg)    Physical Exam Vitals reviewed.  Cardiovascular:     Comments: EKG---- NSR, 73 bpm Incomplete RBBB No LVH Improved  Musculoskeletal:     Right wrist: Normal.     Left wrist: Swelling, tenderness and bony tenderness present. No deformity, effusion or lacerations. Decreased range of motion.     Right lower leg: No edema.     Left lower leg: No edema.  Psychiatric:        Attention and Perception: He is inattentive.        Speech: Speech is delayed and tangential.        Behavior: Behavior is agitated.     Lab Results  Component Value Date  WBC 4.3 08/08/2023   HGB 13.3 08/08/2023   HCT 40.7 08/08/2023   PLT 132.0 (L) 08/08/2023   GLUCOSE 90 02/04/2023   CHOL 104 05/04/2023   TRIG 56.0 05/04/2023   HDL 65.80 05/04/2023   LDLCALC 27 05/04/2023   ALT 22 02/04/2023   AST 24 02/04/2023   NA 135 02/04/2023   K 3.6 02/04/2023   CL 100 02/04/2023   CREATININE 0.59 (L) 02/04/2023   BUN 13 02/04/2023   CO2 22 02/04/2023   TSH 1.23 10/26/2022   PSA 1.49 05/04/2023   INR 1.0 05/04/2023   HGBA1C 5.6 10/03/2019    DG Wrist Complete Left Result Date: 10/15/2023 CLINICAL DATA:  swelling EXAM: LEFT WRIST - COMPLETE 3+ VIEW COMPARISON:  X-ray right wrist 09/26/2023 for comparison. FINDINGS: There is no evidence of fracture or dislocation. Moderate to severe arthropathy of the carpal bones. Subchondral cystic changes of  scaphoid, lunate, capitate. Chondrocalcinosis. Diffuse proximal hand/wrist subcutaneus soft tissue edema. IMPRESSION: 1. No acute displaced fracture or dislocation. 2. 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 Diffuse proximal hand/wrist subcutaneus soft tissue edema. Electronically Signed   By: Morgane  Naveau M.D.   On: 10/15/2023 14:42    Assessment & Plan:  Gouty arthritis of left wrist -     Basic metabolic panel with GFR; Future -     Uric acid; Future -     methylPREDNISolone  Acetate -     C-reactive protein; Future -     Colchicine; Take 1 tablet (0.6 mg total) by mouth 2 (two) times daily.  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) -     For home use only DME 4 wheeled rolling walker with seat  Thrombocytopenia -     CBC with Differential/Platelet; Future -     C-reactive protein; Future  Primary hypertension -     Basic metabolic panel with GFR; Future -     CBC with Differential/Platelet; Future -     TSH; Future -     EKG 12-Lead  Primary osteoarthritis of left knee -     For home use only DME 4 wheeled rolling walker with seat  Moderate dementia associated with alcoholism, without behavioral disturbance, psychotic disturbance, mood disturbance, or anxiety (HCC) -     For home use only DME 4 wheeled rolling walker with seat  DDD (degenerative disc disease), cervical -     For home use only DME 4 wheeled rolling walker with seat  Foraminal stenosis of cervical region -     For home use only DME 4 wheeled rolling walker with seat  Dementia with behavioral disturbance (HCC) -     For home use only DME 4 wheeled rolling walker with seat  Agitation due to dementia (HCC) -     Rexulti ; Take 1 tablet (0.5 mg total) by mouth daily.  Dispense: 30 tablet; Refill: 0     Follow-up: Return in about 6 months (around 04/23/2024).  Debby Molt, MD

## 2023-10-25 ENCOUNTER — Ambulatory Visit: Payer: Self-pay | Admitting: Internal Medicine

## 2023-11-18 ENCOUNTER — Other Ambulatory Visit: Payer: Self-pay | Admitting: Internal Medicine

## 2023-11-18 DIAGNOSIS — M109 Gout, unspecified: Secondary | ICD-10-CM

## 2023-12-05 ENCOUNTER — Other Ambulatory Visit: Payer: Self-pay | Admitting: Internal Medicine

## 2023-12-05 DIAGNOSIS — E785 Hyperlipidemia, unspecified: Secondary | ICD-10-CM

## 2024-01-10 ENCOUNTER — Encounter: Payer: Self-pay | Admitting: Internal Medicine

## 2024-02-01 ENCOUNTER — Other Ambulatory Visit: Payer: Self-pay | Admitting: Internal Medicine

## 2024-02-01 DIAGNOSIS — E785 Hyperlipidemia, unspecified: Secondary | ICD-10-CM

## 2024-02-01 DIAGNOSIS — M1712 Unilateral primary osteoarthritis, left knee: Secondary | ICD-10-CM

## 2024-02-01 DIAGNOSIS — F331 Major depressive disorder, recurrent, moderate: Secondary | ICD-10-CM

## 2024-03-07 ENCOUNTER — Ambulatory Visit
